# Patient Record
Sex: Male | Born: 1965
Health system: Southern US, Community
[De-identification: ages and names within clinical notes are randomized; demographics above are authoritative.]

## PROBLEM LIST (undated history)

## (undated) DIAGNOSIS — K219 Gastro-esophageal reflux disease without esophagitis: Secondary | ICD-10-CM

## (undated) DIAGNOSIS — E785 Hyperlipidemia, unspecified: Secondary | ICD-10-CM

## (undated) DIAGNOSIS — I10 Essential (primary) hypertension: Secondary | ICD-10-CM

## (undated) DIAGNOSIS — M1990 Unspecified osteoarthritis, unspecified site: Secondary | ICD-10-CM

## (undated) DIAGNOSIS — Z86010 Personal history of colonic polyps: Secondary | ICD-10-CM

## (undated) DIAGNOSIS — Z111 Encounter for screening for respiratory tuberculosis: Secondary | ICD-10-CM

## (undated) DIAGNOSIS — G4730 Sleep apnea, unspecified: Secondary | ICD-10-CM

## (undated) HISTORY — DX: Essential (primary) hypertension: I10

## (undated) HISTORY — DX: Unspecified osteoarthritis, unspecified site: M19.90

## (undated) HISTORY — DX: Hyperlipidemia, unspecified: E78.5

## (undated) HISTORY — PX: TYMPANOPLASTY: SHX33

## (undated) HISTORY — DX: Sleep apnea, unspecified: G47.30

---
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
## 2004-04-26 ENCOUNTER — Ambulatory Visit (HOSPITAL_COMMUNITY): Admission: RE | Admit: 2004-04-26 | Discharge: 2004-04-26 | Payer: Self-pay | Admitting: Internal Medicine

## 2004-07-17 HISTORY — PX: LUMBAR DISC SURGERY: SHX700

## 2005-01-20 ENCOUNTER — Emergency Department (HOSPITAL_COMMUNITY): Admission: EM | Admit: 2005-01-20 | Discharge: 2005-01-20 | Payer: Self-pay | Admitting: Emergency Medicine

## 2005-01-25 ENCOUNTER — Encounter: Admission: RE | Admit: 2005-01-25 | Discharge: 2005-02-14 | Payer: Self-pay | Admitting: Occupational Medicine

## 2005-01-27 ENCOUNTER — Emergency Department (HOSPITAL_COMMUNITY): Admission: EM | Admit: 2005-01-27 | Discharge: 2005-01-27 | Payer: Self-pay | Admitting: Emergency Medicine

## 2005-02-10 ENCOUNTER — Ambulatory Visit (HOSPITAL_COMMUNITY): Admission: RE | Admit: 2005-02-10 | Discharge: 2005-02-10 | Payer: Self-pay | Admitting: Orthopaedic Surgery

## 2005-03-08 ENCOUNTER — Observation Stay (HOSPITAL_COMMUNITY): Admission: RE | Admit: 2005-03-08 | Discharge: 2005-03-09 | Payer: Self-pay | Admitting: Orthopaedic Surgery

## 2005-04-25 ENCOUNTER — Encounter: Admission: RE | Admit: 2005-04-25 | Discharge: 2005-05-30 | Payer: Self-pay | Admitting: Orthopaedic Surgery

## 2008-07-17 HISTORY — PX: ROTATOR CUFF REPAIR: SHX139

## 2009-02-17 ENCOUNTER — Encounter: Admission: RE | Admit: 2009-02-17 | Discharge: 2009-04-08 | Payer: Self-pay | Admitting: Occupational Medicine

## 2009-03-30 ENCOUNTER — Ambulatory Visit (HOSPITAL_COMMUNITY): Admission: RE | Admit: 2009-03-30 | Discharge: 2009-03-30 | Payer: Self-pay | Admitting: Occupational Medicine

## 2009-05-06 ENCOUNTER — Ambulatory Visit (HOSPITAL_BASED_OUTPATIENT_CLINIC_OR_DEPARTMENT_OTHER): Admission: RE | Admit: 2009-05-06 | Discharge: 2009-05-07 | Payer: Self-pay | Admitting: Orthopedic Surgery

## 2010-10-20 LAB — POCT I-STAT, CHEM 8
BUN: 15 mg/dL (ref 6–23)
Calcium, Ion: 1.11 mmol/L — ABNORMAL LOW (ref 1.12–1.32)
Chloride: 108 mEq/L (ref 96–112)
Creatinine, Ser: 0.7 mg/dL (ref 0.4–1.5)
Glucose, Bld: 100 mg/dL — ABNORMAL HIGH (ref 70–99)
HCT: 45 % (ref 39.0–52.0)
Hemoglobin: 15.3 g/dL (ref 13.0–17.0)
Potassium: 4 mEq/L (ref 3.5–5.1)
Sodium: 138 mEq/L (ref 135–145)
TCO2: 21 mmol/L (ref 0–100)

## 2010-12-02 NOTE — Op Note (Signed)
Jim Hunter, Jim Hunter                 ACCOUNT NO.:  [id number]   MEDICAL RECORD NO.:  [id number]          PATIENT TYPE:  OBV   LOCATION:  3019                         FACILITY:  [facility]   PHYSICIAN:  Sharolyn Douglas, M.D.        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   PROCEDURE:  Left L4-5 extraforaminal microdiskectomy.   SURGEON:  Sharolyn Douglas, M.D.   ASSISTANT:  Verlin Fester, P.A.   ANESTHESIA:  General endotracheal.   COMPLICATIONS:  None.   INDICATIONS:  The patient is a pleasant 45 year old male with severe left  lower extremity radiculopathy secondary to large extruded extraforaminal  disk herniation. He has developed weakness in the quadriceps which has not  responded to conservative measures. Because of his severe pain and weakness,  he has elected to undergo microdiskectomy through an intertransverse  extraforaminal approach in order to hopefully improve his symptoms and his  weakness. He knows there are no guarantees.  Risks and benefits reviewed.   PROCEDURE:  The patient was identified holding area and taken to the  operating room, underwent general orotracheal anesthesia without difficulty  given prophylactic IV antibiotics. Turned prone onto the [place] frame. All  bony prominences padded. Face and eyes protected at all times. Back prepped,  draped usual sterile fashion. Fluoroscopy was brought into the field and the  L4-5 level was identified. A 3 cm incision was made 4 cm lateral to the  midline on the left side. Dissection was carried to the deep fascia.  Dilators from the Maxcess retractor system were used to spread the  paraspinal muscles docking on the lateral portion of the L4-5 facette joint  using fluoroscopy. The Maxcess retractor was then placed with the length 80-  mm blades over the final  dilator and gently spread. The retractor was  attached to the operating room table using the attachment arm. The  microscope was draped and brought into the field. The transverse processes  of L4-L5 were identified. The intertransverse membrane was elevated. We  immediately identified the L4 nerve root which was being displaced dorsally.  By gently mobilizing the nerve root cephalad, a large extruded disk  herniation was immediately identified. The herniation was removed with a  pituitary rongeur in one large chunk. We then explored the extraforaminal  space and identified the rent in the underlying annulus of the L4-5 disk.  This was entered using a pituitary and several additional loose fragments  were  removed. The wound was irrigated. Bleeding was controlled. The wound was  closed in layers with a 0 Vicryl on the deep layer, 2-0 Vicryl subcutaneous  layer and then Dermabond on the skin edges. The patient was turned supine,  extubated without difficulty and transferred to recovery in stable  condition.      Sharolyn Douglas, M.D.  Electronically Signed     MC/MEDQ  D:  03/08/2005  T:  03/09/2005  Job:  [id number] 

## 2011-03-03 ENCOUNTER — Other Ambulatory Visit: Payer: Self-pay | Admitting: Occupational Medicine

## 2011-03-03 DIAGNOSIS — M25561 Pain in right knee: Secondary | ICD-10-CM

## 2011-03-07 ENCOUNTER — Ambulatory Visit (HOSPITAL_COMMUNITY)
Admission: RE | Admit: 2011-03-07 | Discharge: 2011-03-07 | Disposition: A | Discharge status: Home / Self Care | Payer: PRIVATE HEALTH INSURANCE | Source: Ambulatory Visit | Attending: Occupational Medicine | Admitting: Occupational Medicine

## 2011-03-07 DIAGNOSIS — M23359 Other meniscus derangements, posterior horn of lateral meniscus, unspecified knee: Secondary | ICD-10-CM | POA: Insufficient documentation

## 2011-03-07 DIAGNOSIS — M25561 Pain in right knee: Secondary | ICD-10-CM

## 2011-03-07 DIAGNOSIS — M25469 Effusion, unspecified knee: Secondary | ICD-10-CM | POA: Insufficient documentation

## 2011-03-07 DIAGNOSIS — M1710 Unilateral primary osteoarthritis, unspecified knee: Secondary | ICD-10-CM | POA: Insufficient documentation

## 2011-03-07 DIAGNOSIS — M25569 Pain in unspecified knee: Secondary | ICD-10-CM | POA: Insufficient documentation

## 2011-03-07 DIAGNOSIS — IMO0002 Reserved for concepts with insufficient information to code with codable children: Secondary | ICD-10-CM | POA: Insufficient documentation

## 2011-03-07 DIAGNOSIS — R609 Edema, unspecified: Secondary | ICD-10-CM | POA: Insufficient documentation

## 2011-06-30 ENCOUNTER — Emergency Department
Admission: EM | Admit: 2011-06-30 | Discharge: 2011-06-30 | Disposition: A | Discharge status: Home / Self Care | Payer: PRIVATE HEALTH INSURANCE | Source: Home / Self Care | Attending: Emergency Medicine | Admitting: Emergency Medicine

## 2011-06-30 ENCOUNTER — Encounter: Payer: Self-pay | Admitting: Emergency Medicine

## 2011-06-30 DIAGNOSIS — M545 Low back pain, unspecified: Secondary | ICD-10-CM

## 2011-06-30 DIAGNOSIS — M62830 Muscle spasm of back: Secondary | ICD-10-CM

## 2011-06-30 DIAGNOSIS — M5380 Other specified dorsopathies, site unspecified: Secondary | ICD-10-CM

## 2011-06-30 MED ORDER — TRAMADOL-ACETAMINOPHEN 37.5-325 MG PO TABS: 1.0000 | ORAL_TABLET | Freq: Four times a day (QID) | ORAL | Status: AC | PRN

## 2011-06-30 MED ORDER — METAXALONE 800 MG PO TABS: 800.0000 mg | ORAL_TABLET | Freq: Three times a day (TID) | ORAL | Status: AC

## 2011-06-30 NOTE — ED Provider Notes (Signed)
History     CSN: [id number] Arrival date & time: 06/30/2011 11:07 AM   First MD Initiated Contact with Patient 06/30/11 1039      Chief Complaint  Patient presents with  . Back Pain    (Consider location/radiation/quality/duration/timing/severity/associated sxs/prior treatment) HPI The patient presents today with back pain. Location: Bilateral lower back Timing: For the last few days. He does a lot of lifting, pushing, and pulling at work. He thinks this is what aggravated. He has been having some chronic back pain for a while ever since he had a discectomy. Description: Tight spasm Worse with: Certain movements Better with: Rest  Trauma: no Bladder/bowel incontinence: no Weakness: no Fever/chills: no Night pain: no Unexplained weight loss: no Cancer/immunosuppression: no PMH of osteoporosis or chronic steroid use:  no   History reviewed. No pertinent past medical history.  No past surgical history on file.  No family history on file.  History  Substance Use Topics  . Smoking status: Not on file  . Smokeless tobacco: Not on file  . Alcohol Use: Not on file      Review of Systems  Allergies  Lipitor  Home Medications   Current Outpatient Rx  Name Route Sig Dispense Refill  . ETODOLAC 200 MG PO CAPS Oral Take 200 mg by mouth every 8 (eight) hours.      Marland Kitchen LISINOPRIL 10 MG PO TABS Oral Take 10 mg by mouth daily.      Marland Kitchen OMEPRAZOLE 10 MG PO CPDR Oral Take 10 mg by mouth daily.      Marland Kitchen METAXALONE 800 MG PO TABS Oral Take 1 tablet (800 mg total) by mouth 3 (three) times daily. 21 tablet 0  . TRAMADOL-ACETAMINOPHEN 37.5-325 MG PO TABS Oral Take 1 tablet by mouth every 6 (six) hours as needed for pain. 30 tablet 0    BP 126/86  Pulse 122  Temp(Src) 99 F (37.2 C) (Oral)  Resp 18  Ht 5\' 9"  (1.753 m)  Wt 243 lb (110.224 kg)  BMI 35.88 kg/m2  SpO2 98%  Physical Exam  Nursing note and vitals reviewed. Constitutional: He is oriented to person, place, and  time. He appears well-developed and well-nourished.  HENT:  Head: Normocephalic and atraumatic.  Eyes: No scleral icterus.  Neck: Neck supple.  Cardiovascular: Regular rhythm and normal heart sounds.   Pulmonary/Chest: Effort normal and breath sounds normal. No respiratory distress.  Musculoskeletal:       Lumbar back: He exhibits tenderness, pain and spasm. He exhibits no bony tenderness.  Neurological: He is alert and oriented to person, place, and time. He has normal strength and normal reflexes. No sensory deficit.  Skin: Skin is warm and dry. No rash noted.  Psychiatric: He has a normal mood and affect. His speech is normal.    ED Course  Procedures (including critical care time)  Labs Reviewed - No data to display No results found.   1. Pain in lower back   2. Back spasm       MDM  I have given him a prescription for both Flexeril and Ultracet. I informed him to use a heating pad, and avoid heavy lifting, pushing, and pulling for about a week. I've also given him the information for Dr. Shon Baton in case the pain continues since he states that he would like to consider getting a steroid injection. Other treatment options include physical therapy and other modalities.     Lily Kocher, MD 06/30/11 1124

## 2011-06-30 NOTE — ED Notes (Signed)
LBP after moving quickly 3 days ago

## 2012-07-29 ENCOUNTER — Encounter: Payer: Self-pay | Admitting: Family Medicine

## 2012-07-29 ENCOUNTER — Ambulatory Visit (INDEPENDENT_AMBULATORY_CARE_PROVIDER_SITE_OTHER): Payer: 59 | Admitting: Family Medicine

## 2012-07-29 VITALS — BP 110/78 | HR 87 | Temp 97.8°F | Ht 69.0 in | Wt 236.0 lb

## 2012-07-29 DIAGNOSIS — E669 Obesity, unspecified: Secondary | ICD-10-CM | POA: Insufficient documentation

## 2012-07-29 DIAGNOSIS — I10 Essential (primary) hypertension: Secondary | ICD-10-CM

## 2012-07-29 DIAGNOSIS — E785 Hyperlipidemia, unspecified: Secondary | ICD-10-CM

## 2012-07-29 MED ORDER — SILDENAFIL CITRATE 100 MG PO TABS: 100.0000 mg | ORAL_TABLET | Freq: Every day | ORAL | Status: DC | PRN

## 2012-07-29 MED ORDER — OMEPRAZOLE 20 MG PO CPDR: 20.0000 mg | DELAYED_RELEASE_CAPSULE | Freq: Every day | ORAL | Status: DC

## 2012-07-29 MED ORDER — LISINOPRIL-HYDROCHLOROTHIAZIDE 20-12.5 MG PO TABS: 1.0000 | ORAL_TABLET | Freq: Two times a day (BID) | ORAL | Status: DC

## 2012-07-29 NOTE — Progress Notes (Signed)
  Subjective:    Patient ID: Jim Hunter, male    DOB: 1966-04-20, 47 y.o.   MRN: [id number]  HPI  Jim Hunter comes in to establish care.  He has no specific complaints today.   HTN- takes lisinopril/hctz twice daily, he has no side effects from the medication.  He denies chest pain, dyspnea, LE edema, palpitations.   Obesity- has lost about 7 lbs in the last year, says he watched his diet very carefully during Jim Hunter last year and lost weight, is hoping to do the same thing again this year.  He says he has been avoiding pork and salt, which has helped.  He does not get much formal exercise but does a lot of walking.   HLD- allergic to statins, has been trying to control it with diet, says he thinks last panel was checked about 3 months ago but not sure what it was.   Past Medical History  Diagnosis Date  . Hypertension   . Hyperlipidemia   . Sleep apnea   . Arthritis    Family History  Problem Relation Age of Onset  . Hypertension Mother   . Hypertension Sister   . Hypertension Brother   . Hypertension Maternal Grandmother    History  Substance Use Topics  . Smoking status: Former Smoker -- 10.0 packs/day  . Smokeless tobacco: Never Used  . Alcohol Use: Yes     Comment: Ocasional    Review of Systems Pt complains of decreased hearing, otherwise a 12-point review of systems was negative.     Objective:   Physical Exam BP 110/78  Pulse 87  Temp 97.8 F (36.6 C) (Oral)  Ht 5\' 9"  (1.753 m)  Wt 236 lb (107.049 kg)  BMI 34.85 kg/m2 General appearance: alert, cooperative and no distress Neck: no adenopathy, supple, symmetrical, trachea midline and thyroid not enlarged, symmetric, no tenderness/mass/nodules Lungs: clear to auscultation bilaterally Heart: regular rate and rhythm, S1, S2 normal, no murmur, click, rub or gallop Extremities: extremities normal, atraumatic, no cyanosis or edema Pulses: 2+ and symmetric      Assessment & Plan:

## 2012-07-29 NOTE — Assessment & Plan Note (Signed)
Well controlled on prinzide BID- will not make changes, refill sent to pharmacy.

## 2012-07-29 NOTE — Assessment & Plan Note (Signed)
Statin allergy, says cholesterol checked recently, will try to obtain records from previous doctor, if unable will plan on cholesterol panel next visit.

## 2012-07-29 NOTE — Patient Instructions (Signed)
It was nice to meet you.  Your blood pressure today was BP: 110/78 mmHg.  Remember your goal blood pressure is about 130/80 or lower.  Please be sure to take your medication every day.  Great Job!  Please continue to watch your diet, and increase your activity.  Please come back and see me in about 6 months for a check up, or sooner if needed.

## 2012-07-29 NOTE — Assessment & Plan Note (Signed)
Discussed diet and exercise, pt is making some dietary changes but not highly motivated to lose weight right now.

## 2012-08-08 ENCOUNTER — Telehealth: Payer: Self-pay | Admitting: Family Medicine

## 2012-08-08 NOTE — Telephone Encounter (Signed)
Called patient left message.  I received his records from his prior doctor, had lipid panel last October, with total cholestero 217, LDL 152.  He is allergic to all statins, has also tried tricor and lovaza, does not tolerate them.  I asked him to call back and let me know if he has tried Zetia before because it is different from the other medications he has tried, it could be a good option for him.

## 2012-08-09 NOTE — Telephone Encounter (Signed)
Pt is asking about side affects for this medicine - Zetia - wants to speak with Dr Lula Olszewski.  He will be at work at [phone] (his supervisor can get him) after 12pm

## 2012-08-09 NOTE — Telephone Encounter (Signed)
Tried to call patient back, no answer, will try again later.

## 2012-08-13 NOTE — Telephone Encounter (Signed)
Tried to call again, tried work, pt was not there tried again, no answer.  Will try again later.

## 2012-08-13 NOTE — Telephone Encounter (Signed)
Tried to call home as work said he was not there today earlier.  Left voice mail, will try again later.

## 2012-08-15 ENCOUNTER — Telehealth: Payer: Self-pay | Admitting: Family Medicine

## 2012-08-15 NOTE — Telephone Encounter (Signed)
Pt is asking for you to send infor on MyChart so he can log in.  Is also asking for refill on his Lodine & Ultram - these haven't been filled before by [facility]

## 2012-08-22 ENCOUNTER — Encounter: Payer: Self-pay | Admitting: Family Medicine

## 2012-08-22 MED ORDER — ETODOLAC 200 MG PO CAPS: 200.0000 mg | ORAL_CAPSULE | Freq: Three times a day (TID) | ORAL | Status: DC | PRN

## 2012-08-22 MED ORDER — TRAMADOL HCL 50 MG PO TABS: 50.0000 mg | ORAL_TABLET | Freq: Four times a day (QID) | ORAL | Status: DC | PRN

## 2012-08-22 NOTE — Telephone Encounter (Signed)
I have sent him a letter in the mail with instructions.  I have refilled the medications.  Please let him know.

## 2012-08-23 NOTE — Telephone Encounter (Signed)
LMOVM informing pt that she "mailed the instructions for mychart and refilled the medications you requested". Hester Joslin, Maryjo Rochester

## 2012-08-31 ENCOUNTER — Other Ambulatory Visit: Payer: Self-pay

## 2012-09-20 ENCOUNTER — Other Ambulatory Visit: Payer: Self-pay | Admitting: Family Medicine

## 2012-09-20 MED ORDER — SILDENAFIL CITRATE 100 MG PO TABS: 100.0000 mg | ORAL_TABLET | Freq: Every day | ORAL | Status: DC | PRN

## 2012-10-02 ENCOUNTER — Encounter: Payer: Self-pay | Admitting: Family Medicine

## 2012-10-02 ENCOUNTER — Ambulatory Visit (INDEPENDENT_AMBULATORY_CARE_PROVIDER_SITE_OTHER): Payer: 59 | Admitting: Family Medicine

## 2012-10-02 VITALS — BP 107/76 | HR 76 | Temp 98.1°F | Ht 69.0 in | Wt 238.8 lb

## 2012-10-02 DIAGNOSIS — E785 Hyperlipidemia, unspecified: Secondary | ICD-10-CM

## 2012-10-02 DIAGNOSIS — Z23 Encounter for immunization: Secondary | ICD-10-CM

## 2012-10-02 DIAGNOSIS — K429 Umbilical hernia without obstruction or gangrene: Secondary | ICD-10-CM

## 2012-10-02 DIAGNOSIS — K219 Gastro-esophageal reflux disease without esophagitis: Secondary | ICD-10-CM

## 2012-10-02 MED ORDER — EZETIMIBE 10 MG PO TABS: 10.0000 mg | ORAL_TABLET | Freq: Every day | ORAL | Status: DC

## 2012-10-02 MED ORDER — OMEPRAZOLE 20 MG PO CPDR: 40.0000 mg | DELAYED_RELEASE_CAPSULE | Freq: Every day | ORAL | Status: DC

## 2012-10-02 NOTE — Assessment & Plan Note (Signed)
Will start Zetia as he has allergy to statin.  Discussed lifestyle modification.

## 2012-10-02 NOTE — Progress Notes (Signed)
  Subjective:    Patient ID: Jim Hunter, male    DOB: 1966/07/17, 47 y.o.   MRN: [id number]  HPI  Jim Hunter comes in for follow up.  He noticed in My Chart that he is due for his Tdap.    He complains that omeprazole was helping with his GERD, but now it is not working as well.  He does admit that he has changed his diet some with Alwyn Pea- but says he gave up meat and alcohol.  He does admit to eating spicy food and some fried foods though.   HLD: allergic to statins, last lipids done (scanned in) in October with LDL 153 and total cholesterol 217.  I discussed that I strongly recommend trying different medication for lipids.  He is trying to eat a healthy diet, and is very active at work but does not get much formal exercise.   Abdomen: He also complains of some discomfort around his umbilicus, this comes and goes and never lasts very long.    Past Medical History  Diagnosis Date  . Hypertension   . Hyperlipidemia   . Sleep apnea   . Arthritis    Family History  Problem Relation Age of Onset  . Hypertension Mother   . Hypertension Sister   . Hypertension Brother   . Hypertension Maternal Grandmother    History  Substance Use Topics  . Smoking status: Former Smoker -- 10.00 packs/day  . Smokeless tobacco: Never Used  . Alcohol Use: Yes     Comment: Ocasional    Review of Systems See HPI    Objective:   Physical Exam BP 107/76  Pulse 76  Temp(Src) 98.1 F (36.7 C) (Oral)  Ht 5\' 9"  (1.753 m)  Wt 238 lb 12.8 oz (108.319 kg)  BMI 35.25 kg/m2 General appearance: alert, cooperative and no distress Lungs: clear to auscultation bilaterally Heart: regular rate and rhythm, S1, S2 normal, no murmur, click, rub or gallop Abd: There is a small 1cm umbilical hernia that is soft and reducible.  Extremities: extremities normal, atraumatic, no cyanosis or edema      Assessment & Plan:

## 2012-10-02 NOTE — Assessment & Plan Note (Signed)
Increase omeprazole to 40 daily, gave hand out for GERD diet changes.

## 2012-10-02 NOTE — Patient Instructions (Signed)
It was good to see you.  Please try a new cholesterol pill- Zetia.  Send me a message if you have any problems with it.   Also, you can try increasing your omeprazole to two pills in the morning.  Also, see the hand out with GERD diet suggestions.

## 2012-10-02 NOTE — Assessment & Plan Note (Signed)
Discussed that hernias tend to get bigger over time and that at some point he may need surgery to repair this.  Discussed risk of incarcerated hernia, and red flags to go to ER for.  He will think about this and let me know if he wants to see surgery.

## 2012-12-12 ENCOUNTER — Ambulatory Visit: Payer: Self-pay

## 2012-12-12 ENCOUNTER — Other Ambulatory Visit: Payer: Self-pay | Admitting: Occupational Medicine

## 2012-12-12 DIAGNOSIS — R52 Pain, unspecified: Secondary | ICD-10-CM

## 2013-02-04 ENCOUNTER — Encounter: Payer: Self-pay | Admitting: Family Medicine

## 2013-02-06 ENCOUNTER — Other Ambulatory Visit: Payer: Self-pay | Admitting: Family Medicine

## 2013-02-06 DIAGNOSIS — E785 Hyperlipidemia, unspecified: Secondary | ICD-10-CM

## 2013-02-06 MED ORDER — EZETIMIBE 10 MG PO TABS: 10.0000 mg | ORAL_TABLET | Freq: Every day | ORAL | Status: DC

## 2013-02-28 ENCOUNTER — Other Ambulatory Visit: Payer: Self-pay | Admitting: Family Medicine

## 2013-03-14 ENCOUNTER — Other Ambulatory Visit: Payer: Self-pay | Admitting: Family Medicine

## 2013-03-20 ENCOUNTER — Other Ambulatory Visit: Payer: Self-pay | Admitting: Family Medicine

## 2013-03-26 ENCOUNTER — Telehealth: Payer: Self-pay | Admitting: Family Medicine

## 2013-03-26 MED ORDER — ETODOLAC 200 MG PO CAPS: 200.0000 mg | ORAL_CAPSULE | Freq: Three times a day (TID) | ORAL | Status: DC | PRN

## 2013-03-26 MED ORDER — TRAMADOL HCL 50 MG PO TABS: ORAL_TABLET | ORAL | Status: DC

## 2013-03-26 NOTE — Telephone Encounter (Signed)
Pt came by to check on progress of refill authorization for Tramadol and Lodine. Please advise

## 2013-03-26 NOTE — Telephone Encounter (Signed)
Will need appt prior to refilling Tramadol again Tramadol refill at front desk for pick up

## 2013-03-26 NOTE — Telephone Encounter (Signed)
Left message for pt that rx is ready for pick up.  Please advise if he calls back that he needs appt. Jazmin Hartsell,CMA

## 2013-04-14 ENCOUNTER — Encounter: Payer: Self-pay | Admitting: Family Medicine

## 2013-04-17 ENCOUNTER — Encounter: Payer: Self-pay | Admitting: Family Medicine

## 2013-05-01 ENCOUNTER — Ambulatory Visit (INDEPENDENT_AMBULATORY_CARE_PROVIDER_SITE_OTHER): Payer: 59 | Admitting: Family Medicine

## 2013-05-01 ENCOUNTER — Encounter: Payer: Self-pay | Admitting: Family Medicine

## 2013-05-01 VITALS — BP 114/83 | HR 85 | Temp 99.1°F | Wt 227.0 lb

## 2013-05-01 DIAGNOSIS — H938X9 Other specified disorders of ear, unspecified ear: Secondary | ICD-10-CM

## 2013-05-01 DIAGNOSIS — E785 Hyperlipidemia, unspecified: Secondary | ICD-10-CM

## 2013-05-01 DIAGNOSIS — I10 Essential (primary) hypertension: Secondary | ICD-10-CM

## 2013-05-01 DIAGNOSIS — H938X2 Other specified disorders of left ear: Secondary | ICD-10-CM

## 2013-05-01 DIAGNOSIS — M549 Dorsalgia, unspecified: Secondary | ICD-10-CM

## 2013-05-01 DIAGNOSIS — K219 Gastro-esophageal reflux disease without esophagitis: Secondary | ICD-10-CM

## 2013-05-01 DIAGNOSIS — E669 Obesity, unspecified: Secondary | ICD-10-CM

## 2013-05-01 MED ORDER — LISINOPRIL-HYDROCHLOROTHIAZIDE 20-12.5 MG PO TABS: 1.0000 | ORAL_TABLET | Freq: Every day | ORAL | Status: DC

## 2013-05-01 MED ORDER — OMEPRAZOLE 20 MG PO CPDR: 40.0000 mg | DELAYED_RELEASE_CAPSULE | Freq: Every day | ORAL | Status: DC

## 2013-05-01 MED ORDER — ETODOLAC 200 MG PO CAPS: 200.0000 mg | ORAL_CAPSULE | Freq: Three times a day (TID) | ORAL | Status: DC | PRN

## 2013-05-01 MED ORDER — SILDENAFIL CITRATE 100 MG PO TABS: 100.0000 mg | ORAL_TABLET | Freq: Every day | ORAL | Status: DC | PRN

## 2013-05-01 MED ORDER — TRAMADOL HCL 50 MG PO TABS: ORAL_TABLET | ORAL | Status: DC

## 2013-05-01 NOTE — Patient Instructions (Signed)
THank you for coming in today Please decrease your lisinopril/HCTZ to once a day and check your pressures daily  Please decrease your prilosec to only as needed unless you flare adn take it for 2 weeks Get your fasting blood work done tomorrow.

## 2013-05-01 NOTE — Progress Notes (Signed)
Jim Hunter is a 47 y.o. male who presents to [facility] today for f/u   HLD: Tried tricor and another cholesterol medicine but caused rash. Denies CP SOB, palpitations  HTN: taking lisinopril and HCTZ. Excellent control   Lumbar pain: flares every couple months. Achy. S/p diskectomy. Denies numbness in feet or toes.    GERD: takes once daily. Has tried coming off before and flared again after some time.   The following portions of the patient's history were reviewed and updated as appropriate: allergies, current medications, past medical history, family and social history, and problem list.  Patient is a nonsmoker.   Past Medical History  Diagnosis Date  . Hypertension   . Hyperlipidemia   . Sleep apnea   . Arthritis    ROS as above otherwise neg.    Medications reviewed. Current Outpatient Prescriptions  Medication Sig Dispense Refill  . etodolac (LODINE) 200 MG capsule Take 1 capsule (200 mg total) by mouth every 8 (eight) hours as needed (pain.  Take with food. ).  30 capsule  0  . ezetimibe (ZETIA) 10 MG tablet Take 1 tablet (10 mg total) by mouth daily.  90 tablet  3  . lisinopril-hydrochlorothiazide (PRINZIDE,ZESTORETIC) 20-12.5 MG per tablet TAKE 1 TABLET BY MOUTH 2 TIMES DAILY.  180 tablet  1  . omeprazole (PRILOSEC) 20 MG capsule Take 2 capsules (40 mg total) by mouth daily.  180 capsule  1  . sildenafil (VIAGRA) 100 MG tablet Take 1 tablet (100 mg total) by mouth daily as needed for erectile dysfunction.  10 tablet  3  . traMADol (ULTRAM) 50 MG tablet TAKE 1 TABLET BY MOUTH EVERY 6 HOURS AS NEEDED  30 tablet  0   No current facility-administered medications for this visit.    Exam:  BP 114/83  Pulse 85  Temp(Src) 99.1 F (37.3 C) (Oral)  Wt 227 lb (102.967 kg)  BMI 33.51 kg/m2 Gen: Well NAD HEENT: EOMI,  MMM, L TM partially obstructed by cerumen. TM w/ possible sinus tract. No discharge or purulence or erythema Lungs: CTABL Nl WOB Heart: RRR no MRG Abd: NABS, NT,  ND Exts: Non edematous BL  LE, warm and well perfused.   No results found for this or any previous visit (from the past 72 hour(s)).

## 2013-05-02 ENCOUNTER — Other Ambulatory Visit (INDEPENDENT_AMBULATORY_CARE_PROVIDER_SITE_OTHER): Payer: 59

## 2013-05-02 DIAGNOSIS — E785 Hyperlipidemia, unspecified: Secondary | ICD-10-CM

## 2013-05-02 DIAGNOSIS — M549 Dorsalgia, unspecified: Secondary | ICD-10-CM | POA: Insufficient documentation

## 2013-05-02 DIAGNOSIS — E669 Obesity, unspecified: Secondary | ICD-10-CM

## 2013-05-02 DIAGNOSIS — H938X2 Other specified disorders of left ear: Secondary | ICD-10-CM | POA: Insufficient documentation

## 2013-05-02 LAB — CBC
HCT: 37.7 % — ABNORMAL LOW (ref 39.0–52.0)
Hemoglobin: 12.9 g/dL — ABNORMAL LOW (ref 13.0–17.0)
MCH: 27.2 pg (ref 26.0–34.0)
MCV: 79.5 fL (ref 78.0–100.0)
RBC: 4.74 MIL/uL (ref 4.22–5.81)
RDW: 16 % — ABNORMAL HIGH (ref 11.5–15.5)

## 2013-05-02 LAB — COMPREHENSIVE METABOLIC PANEL
ALT: 25 U/L (ref 0–53)
Albumin: 4.2 g/dL (ref 3.5–5.2)
Alkaline Phosphatase: 52 U/L (ref 39–117)
CO2: 26 mEq/L (ref 19–32)
Calcium: 9.2 mg/dL (ref 8.4–10.5)
Chloride: 106 mEq/L (ref 96–112)
Creat: 1.19 mg/dL (ref 0.50–1.35)
Total Protein: 6.9 g/dL (ref 6.0–8.3)

## 2013-05-02 LAB — LIPID PANEL
Cholesterol: 193 mg/dL (ref 0–200)
Total CHOL/HDL Ratio: 5.2 Ratio

## 2013-05-02 NOTE — Assessment & Plan Note (Addendum)
Intermittent flares requiring tramadol and lodine.  Last dosing February for both meds.

## 2013-05-02 NOTE — Assessment & Plan Note (Addendum)
Lipid panel adn CMET as pt desiring to change cholesterol therapy if possible Will calculate ASCVD risk to determine need for lipid lowering meds

## 2013-05-02 NOTE — Progress Notes (Signed)
CMP,CBC,FLP AND A1C DONE TODAY Siriah Treat 

## 2013-05-02 NOTE — Assessment & Plan Note (Signed)
Pt to come off daily PPI and only use PRN w/ occasional 2wk burst

## 2013-05-02 NOTE — Assessment & Plan Note (Signed)
Decrease lisiopril/HCTZ to once daily dosing Pt to check pressures Return in 4 wks and will decide if OK to maintain lower dose.  SBP excellent but concern for DBP being elevated

## 2013-05-02 NOTE — Assessment & Plan Note (Signed)
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 Fullness feeling and apparent sinus tract on exam Will refer to ENT for consult on closure

## 2013-05-08 ENCOUNTER — Telehealth: Payer: Self-pay | Admitting: *Deleted

## 2013-05-08 NOTE — Telephone Encounter (Signed)
LMOVM asking patient to call back.  Want to know where pt prefers to go (for ENT) or where he has been in the past. Osborne Oman, CMA

## 2013-05-21 ENCOUNTER — Encounter: Payer: Self-pay | Admitting: Family Medicine

## 2013-05-22 ENCOUNTER — Other Ambulatory Visit: Payer: Self-pay

## 2013-06-02 ENCOUNTER — Ambulatory Visit (INDEPENDENT_AMBULATORY_CARE_PROVIDER_SITE_OTHER): Payer: 59 | Admitting: Family Medicine

## 2013-06-02 ENCOUNTER — Encounter: Payer: Self-pay | Admitting: Family Medicine

## 2013-06-02 VITALS — BP 123/84 | HR 77 | Temp 98.4°F | Wt 238.0 lb

## 2013-06-02 DIAGNOSIS — H938X9 Other specified disorders of ear, unspecified ear: Secondary | ICD-10-CM

## 2013-06-02 DIAGNOSIS — E785 Hyperlipidemia, unspecified: Secondary | ICD-10-CM

## 2013-06-02 DIAGNOSIS — H938X2 Other specified disorders of left ear: Secondary | ICD-10-CM

## 2013-06-02 DIAGNOSIS — K219 Gastro-esophageal reflux disease without esophagitis: Secondary | ICD-10-CM

## 2013-06-02 DIAGNOSIS — M549 Dorsalgia, unspecified: Secondary | ICD-10-CM

## 2013-06-02 DIAGNOSIS — I10 Essential (primary) hypertension: Secondary | ICD-10-CM

## 2013-06-02 MED ORDER — TRAMADOL HCL 50 MG PO TABS: ORAL_TABLET | ORAL | Status: DC

## 2013-06-02 NOTE — Patient Instructions (Signed)
You are doing great Please stop the Zetia and lisinopril/HCTZ Please check your blood pressure regularly and keep track of your numbers Please come back in 1 month to discuss wether or not to restart your blood pressure meds Please continue to improve your diet Please call with any further questions

## 2013-06-02 NOTE — Assessment & Plan Note (Signed)
BP excellent on reduced meds Lengthy discussion over pros and cons of meds  Pt to DC lisinopril/HCTZ for now and check BP regularly at work and then return in 1 mo  Pt would prefer to stay off all meds

## 2013-06-02 NOTE — Assessment & Plan Note (Signed)
Pt to see ENT today

## 2013-06-02 NOTE — Assessment & Plan Note (Signed)
Excellent control w/ only PRN Prilosec

## 2013-06-02 NOTE — Assessment & Plan Note (Signed)
ASCVD risk 4.3% today Pt would like to stop as many meds as possible Will stop Zetia at this time Pt reports previous lipids w/ LDL of >200 Continue diet and exercise Recheck full cholesterol panel in 12 mo

## 2013-06-02 NOTE — Assessment & Plan Note (Signed)
Did not get Tramadol script from last appt Refill today Should last pt 6+ months

## 2013-06-02 NOTE — Progress Notes (Signed)
Jim Hunter is a 47 y.o. male who presents to [facility] today for F/U  GERD: only taking Prilosec as needed. Approximately 1-2x wkly. Has identified triggers.  HTN: Only taking lisinopril once daily. No CP, palpitations, syncope, HA  Lipids: stopped zetia 1 wk ago.   The following portions of the patient's history were reviewed and updated as appropriate: allergies, current medications, past medical history, family and social history, and problem list.  Patient is a nonsmoker.  Past Medical History  Diagnosis Date  . Hypertension   . Hyperlipidemia   . Sleep apnea   . Arthritis     ROS as above otherwise neg.    Medications reviewed. Current Outpatient Prescriptions  Medication Sig Dispense Refill  . etodolac (LODINE) 200 MG capsule Take 1 capsule (200 mg total) by mouth every 8 (eight) hours as needed (pain.  Take with food. ).  30 capsule  0  . ezetimibe (ZETIA) 10 MG tablet Take 1 tablet (10 mg total) by mouth daily.  90 tablet  3  . lisinopril-hydrochlorothiazide (PRINZIDE,ZESTORETIC) 20-12.5 MG per tablet Take 1 tablet by mouth daily.  180 tablet  1  . omeprazole (PRILOSEC) 20 MG capsule Take 2 capsules (40 mg total) by mouth daily.  90 capsule  1  . sildenafil (VIAGRA) 100 MG tablet Take 1 tablet (100 mg total) by mouth daily as needed for erectile dysfunction.  10 tablet  3  . traMADol (ULTRAM) 50 MG tablet TAKE 1 TABLET BY MOUTH EVERY 6 HOURS AS NEEDED  30 tablet  0   No current facility-administered medications for this visit.    Exam: BP 123/84  Pulse 77  Temp(Src) 98.4 F (36.9 C) (Oral)  Wt 238 lb (107.956 kg) Gen: Well NAD HEENT: EOMI,  MMM Lungs: CTABL Nl WOB Heart: RRR no MRG Abd: NABS, NT, ND   No results found for this or any previous visit (from the past 72 hour(s)).

## 2013-06-17 ENCOUNTER — Ambulatory Visit (INDEPENDENT_AMBULATORY_CARE_PROVIDER_SITE_OTHER): Payer: 59 | Admitting: *Deleted

## 2013-06-17 ENCOUNTER — Telehealth: Payer: Self-pay | Admitting: Family Medicine

## 2013-06-17 ENCOUNTER — Other Ambulatory Visit: Payer: Self-pay | Admitting: Family Medicine

## 2013-06-17 VITALS — BP 122/84

## 2013-06-17 DIAGNOSIS — I10 Essential (primary) hypertension: Secondary | ICD-10-CM

## 2013-06-17 MED ORDER — ETODOLAC 200 MG PO CAPS: 200.0000 mg | ORAL_CAPSULE | Freq: Three times a day (TID) | ORAL | Status: DC | PRN

## 2013-06-17 NOTE — Telephone Encounter (Signed)
Pt came in stating that he needs his prescription for [facility] sent to the pharmacy.

## 2013-06-17 NOTE — Progress Notes (Signed)
Patient in today for blood pressure check, Patient states that he has'nt been taking any blood perssure medications. Blood pressure taken manually with large cuff in left arm was 122/84. Will forward to PCP.

## 2013-06-19 ENCOUNTER — Telehealth: Payer: Self-pay | Admitting: Family Medicine

## 2013-07-02 ENCOUNTER — Encounter: Payer: Self-pay | Admitting: Family Medicine

## 2013-07-02 ENCOUNTER — Ambulatory Visit (INDEPENDENT_AMBULATORY_CARE_PROVIDER_SITE_OTHER): Payer: 59 | Admitting: Family Medicine

## 2013-07-02 VITALS — BP 142/89 | HR 83 | Ht 69.0 in | Wt 236.0 lb

## 2013-07-02 DIAGNOSIS — I10 Essential (primary) hypertension: Secondary | ICD-10-CM

## 2013-07-02 DIAGNOSIS — R200 Anesthesia of skin: Secondary | ICD-10-CM

## 2013-07-02 DIAGNOSIS — E785 Hyperlipidemia, unspecified: Secondary | ICD-10-CM

## 2013-07-02 DIAGNOSIS — H938X2 Other specified disorders of left ear: Secondary | ICD-10-CM

## 2013-07-02 DIAGNOSIS — R209 Unspecified disturbances of skin sensation: Secondary | ICD-10-CM

## 2013-07-02 DIAGNOSIS — H938X9 Other specified disorders of ear, unspecified ear: Secondary | ICD-10-CM

## 2013-07-02 MED ORDER — ETODOLAC 400 MG PO TABS: ORAL_TABLET | ORAL | Status: DC

## 2013-07-02 NOTE — Assessment & Plan Note (Signed)
Radicular pain from likely [facility] injury.  Lodine 400mg  BID x 7 days Avoid exacerbating factors.  No carpel tunnel

## 2013-07-02 NOTE — Assessment & Plan Note (Signed)
Started on flonase by ENT Recommended sudafed (aware of BP changes)

## 2013-07-02 NOTE — Assessment & Plan Note (Signed)
Borderline today but previous read at goal Lengthy discussion had w/ pt concernning BP and risks of elevation Pt would like to avoid medications as much as possible. Monitor for now. Pt to check at work or come in for BP checks Pt to come back in 4-12 wks pending on home reads. If elevated will need to go back on low dose.

## 2013-07-02 NOTE — Patient Instructions (Signed)
You are doing well overall You blood pressure is borderline normal Please check this from time to time or come in to our office for a check Please come back to see me in 4 weeks if it remains elevated or 12 weeks if it remains normal Your arm pain is radicular/nerve pain. You have likely inflamed one of your arm nerves.  Please take the Etodolac 400mg  twice a day for the next 7 days and try to avoid any activities that will make you pain/numbness worse Have a wonderful day.

## 2013-07-02 NOTE — Assessment & Plan Note (Signed)
Lipid panel in 11 mo Con diet and exercise

## 2013-07-02 NOTE — Progress Notes (Signed)
Jim Hunter is a 47 y.o. male who presents to [facility] today for arm pain and HTN  HTN: Worked late night. Coffee this am. Denies CP, SOB, Syncope, HA. Does not check at work. Feels well. Off BP meds since last appt  HLD: ROS as above.  Tingling L arm: starts in L wrist or in antecubiatl fossa. Worse when carrying things or at random times. Nearly daily symptoms. Shakes it out to improve. Ongoing for about 2 wks. No change in activity or sleep habits. Denies any loss of function.    The following portions of the patient's history were reviewed and updated as appropriate: allergies, current medications, past medical history, family and social history, and problem list.  Patient is a nonsmoker.  Health Maintenance: has flu shot  Past Medical History  Diagnosis Date  . Hypertension   . Hyperlipidemia   . Sleep apnea   . Arthritis     ROS as above otherwise neg.    Medications reviewed. Current Outpatient Prescriptions  Medication Sig Dispense Refill  . etodolac (LODINE) 200 MG capsule Take 1 capsule (200 mg total) by mouth every 8 (eight) hours as needed (pain.  Take with food. ).  30 capsule  3  . etodolac (LODINE) 400 MG tablet TAKE 1/2 CAPSULE BY MOUTH EVERY 8 HOURS AS NEEDED (PAIN. TAKE WITH FOOD. )  15 tablet  0  . ezetimibe (ZETIA) 10 MG tablet Take 1 tablet (10 mg total) by mouth daily.  90 tablet  3  . lisinopril-hydrochlorothiazide (PRINZIDE,ZESTORETIC) 20-12.5 MG per tablet Take 1 tablet by mouth daily.  180 tablet  1  . omeprazole (PRILOSEC) 20 MG capsule Take 2 capsules (40 mg total) by mouth daily.  90 capsule  1  . sildenafil (VIAGRA) 100 MG tablet Take 1 tablet (100 mg total) by mouth daily as needed for erectile dysfunction.  10 tablet  3  . traMADol (ULTRAM) 50 MG tablet TAKE 1 TABLET BY MOUTH EVERY 6 HOURS AS NEEDED  30 tablet  0   No current facility-administered medications for this visit.    Exam:  BP 142/89  Pulse 83  Ht 5\' 9"  (1.753 m)  Wt 236 lb (107.049 kg)   BMI 34.84 kg/m2 Gen: Well NAD HEENT: EOMI,  MMM  MSK: FROM or arms and wrist. Non-ttp. No muscle waisting. phalans test neg. Sensation intact. 5/5 grip strength.  No results found for this or any previous visit (from the past 72 hour(s)).  A/P (as seen in Problem list)  Hypertension Borderline today but previous read at goal Lengthy discussion had w/ pt concernning BP and risks of elevation Pt would like to avoid medications as much as possible. Monitor for now. Pt to check at work or come in for BP checks Pt to come back in 4-12 wks pending on home reads. If elevated will need to go back on low dose.   Hyperlipidemia Lipid panel in 11 mo Con diet and exercise  Sensation of fullness in left ear Started on flonase by ENT Recommended sudafed (aware of BP changes)  Numbness and tingling in left arm Radicular pain from likely MSK injury.  Lodine 400mg  BID x 7 days Avoid exacerbating factors.  No carpel tunnel

## 2013-08-27 ENCOUNTER — Other Ambulatory Visit: Payer: Self-pay | Admitting: Family Medicine

## 2013-09-08 NOTE — Telephone Encounter (Signed)
Please call pt and inform that we received refill request note for tramadol per last note this should have lasted 88mo. Only 4mo now since last refill. If pt needing refill inform that we can give limited number (15) but would need appt prior to further refills due to clinic policy

## 2013-09-09 ENCOUNTER — Encounter: Payer: Self-pay | Admitting: Family Medicine

## 2013-09-12 ENCOUNTER — Telehealth: Payer: Self-pay | Admitting: Family Medicine

## 2013-09-12 NOTE — Telephone Encounter (Signed)
Called pt to ask further questions about tramadol use and need Left a voicemail. Sent pt Epic email requesting more information  Linna Darner, MD Family Medicine PGY-3 09/12/2013, 10:39 AM

## 2013-09-15 ENCOUNTER — Other Ambulatory Visit: Payer: Self-pay | Admitting: Family Medicine

## 2013-09-15 DIAGNOSIS — M549 Dorsalgia, unspecified: Secondary | ICD-10-CM

## 2013-09-15 MED ORDER — TRAMADOL HCL 50 MG PO TABS: ORAL_TABLET | ORAL | Status: DC

## 2013-09-15 NOTE — Progress Notes (Signed)
Tramadol pohoned in. Pt to [facility] f/u appt prior to next refill

## 2013-09-30 IMAGING — CR DG SHOULDER 2+V*R*
3 series · 3 of 3 positions shown · non-contrast
Comparison: None.

CLINICAL DATA: Lifting injury (11/13/2012) with persistent pain in
the right shoulder radiating down the right arm

RIGHT SHOULDER - 2+ VIEW

[view not recorded (1 of 3)]
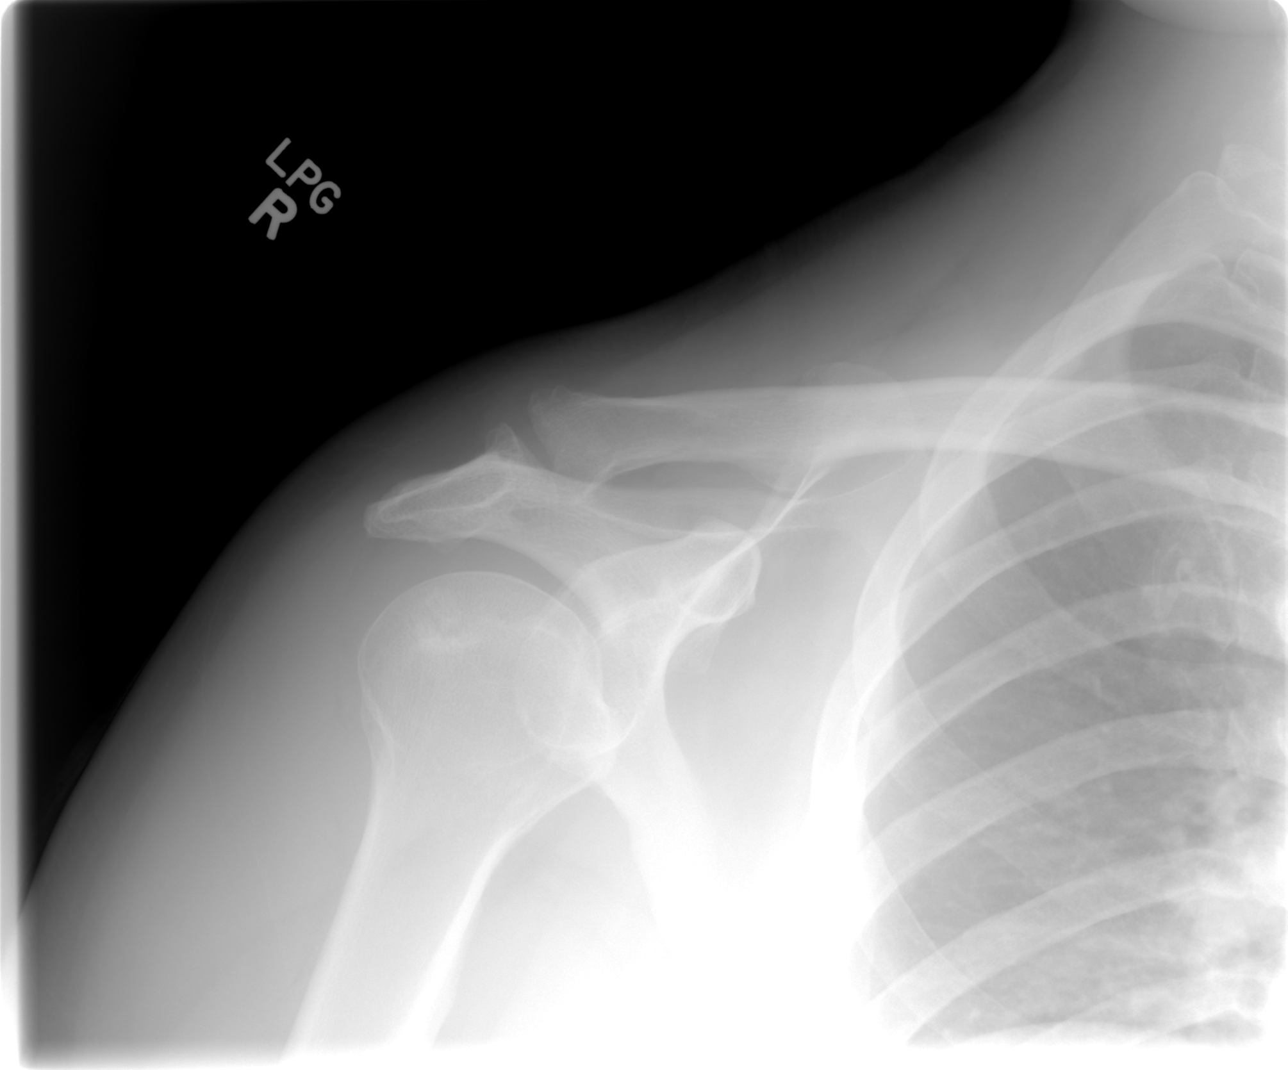

[view not recorded (2 of 3)]
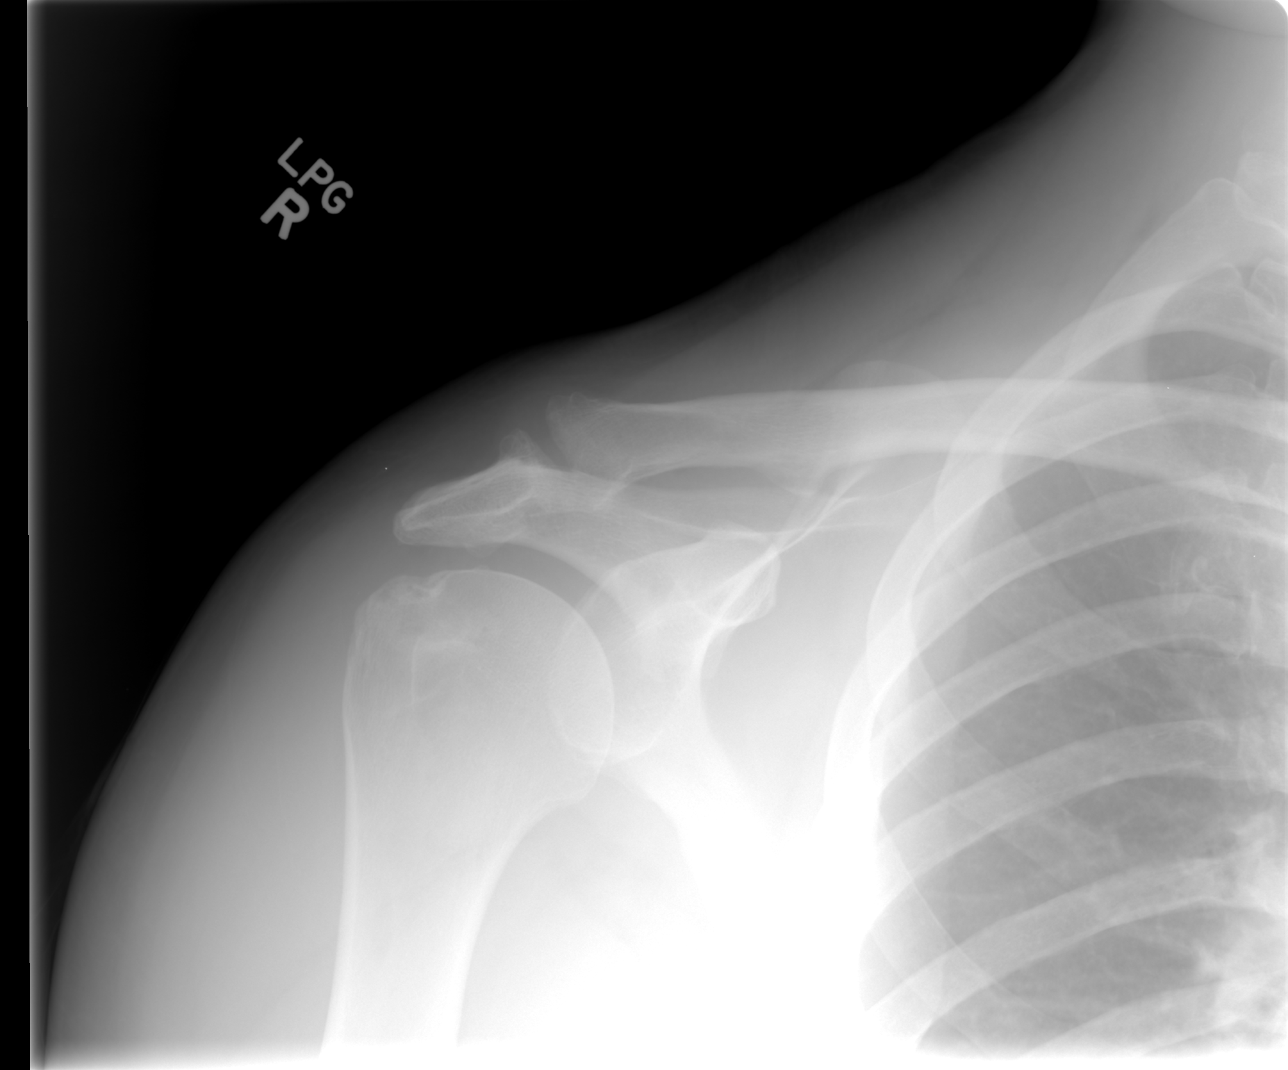

[view not recorded (3 of 3)]
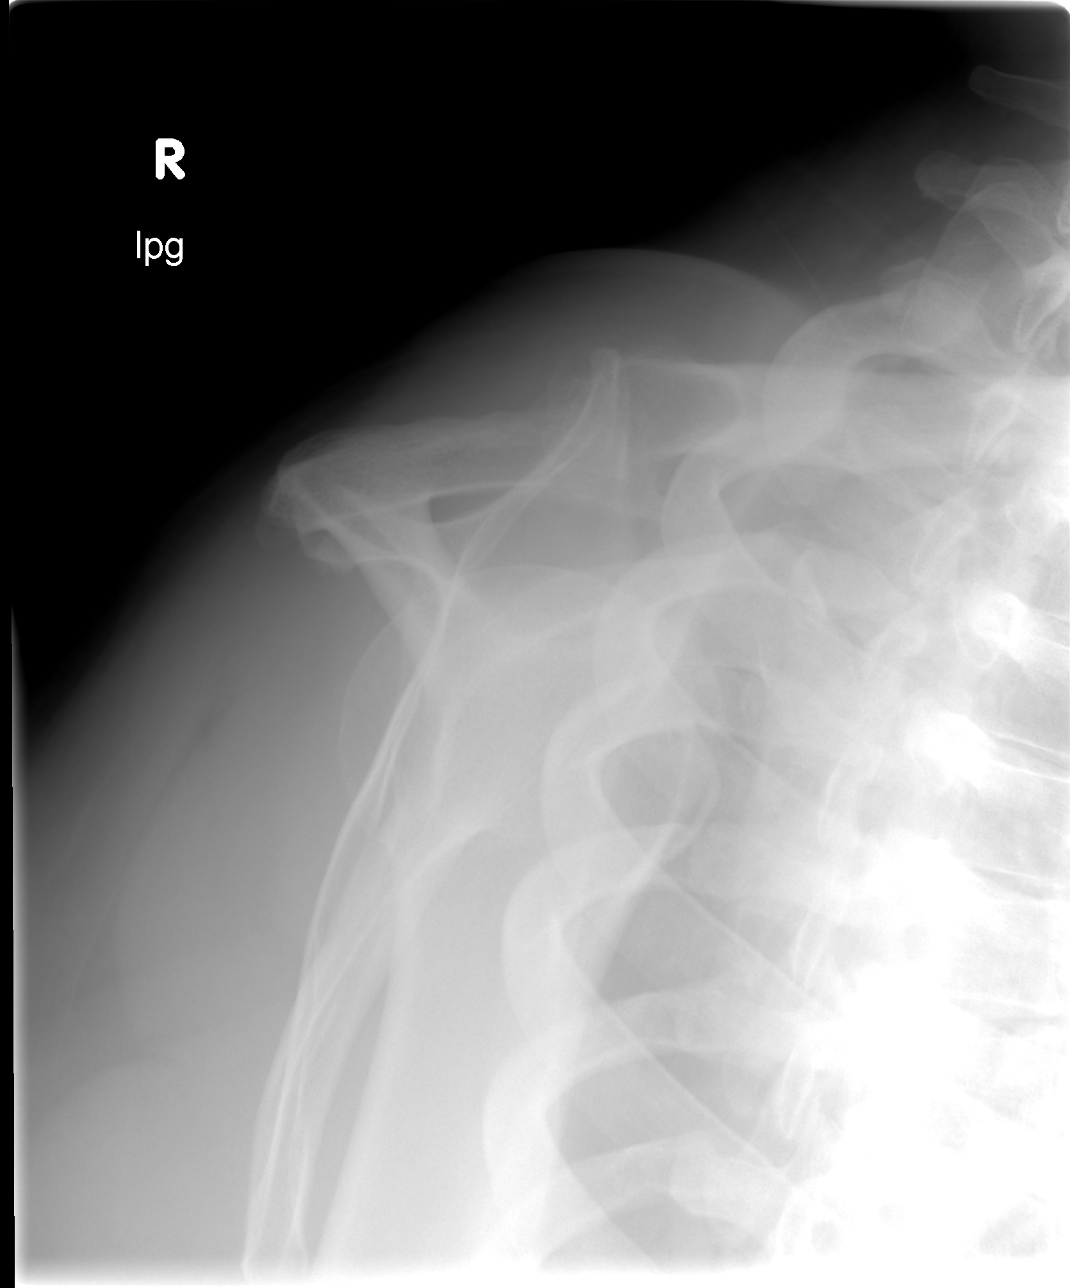

[3 of 3 positions shown; findings below may reference images not displayed]

FINDINGS: No fracture or dislocation.  Mild degenerative change of the AC
joint is suspected with joint space loss, subchondral sclerosis and
minimal inferiorly-directed osteophytosis.  Minimal enthesopathic
change of the rotator cuff insertion site.  No evidence of calcific
tendonitis.  Limited visualization of adjacent thorax normal.
Regional soft tissues are normal.
IMPRESSION: 1.  No acute findings.
2.  Suspected mild degenerative change at the AC joint.
3.  Enthesopathic change of the rotator cuff insertion site,
nonspecific but could be seen in the setting of rotator cuff
pathology.  Further evaluation with shoulder MRI may be obtained as
clinically indicated.

## 2013-10-16 ENCOUNTER — Other Ambulatory Visit: Payer: Self-pay | Admitting: Family Medicine

## 2013-10-29 NOTE — Telephone Encounter (Signed)
Discussed tramadol use w/ pt at last appt.  Pt needs appt prior to refill as outlined on 09/15/13

## 2013-11-04 ENCOUNTER — Ambulatory Visit: Payer: Self-pay | Admitting: Family Medicine

## 2013-11-07 ENCOUNTER — Encounter: Payer: Self-pay | Admitting: Family Medicine

## 2013-11-07 ENCOUNTER — Ambulatory Visit (INDEPENDENT_AMBULATORY_CARE_PROVIDER_SITE_OTHER): Payer: 59 | Admitting: Family Medicine

## 2013-11-07 VITALS — BP 136/80 | HR 87 | Temp 98.2°F | Wt 226.0 lb

## 2013-11-07 DIAGNOSIS — K219 Gastro-esophageal reflux disease without esophagitis: Secondary | ICD-10-CM

## 2013-11-07 DIAGNOSIS — I10 Essential (primary) hypertension: Secondary | ICD-10-CM

## 2013-11-07 DIAGNOSIS — R209 Unspecified disturbances of skin sensation: Secondary | ICD-10-CM

## 2013-11-07 DIAGNOSIS — M549 Dorsalgia, unspecified: Secondary | ICD-10-CM

## 2013-11-07 DIAGNOSIS — R202 Paresthesia of skin: Secondary | ICD-10-CM

## 2013-11-07 DIAGNOSIS — R200 Anesthesia of skin: Secondary | ICD-10-CM

## 2013-11-07 MED ORDER — METAXALONE 800 MG PO TABS: 400.0000 mg | ORAL_TABLET | Freq: Three times a day (TID) | ORAL | Status: DC

## 2013-11-07 MED ORDER — TRAMADOL HCL 50 MG PO TABS: ORAL_TABLET | ORAL | Status: DC

## 2013-11-07 MED ORDER — GABAPENTIN 100 MG PO CAPS: 100.0000 mg | ORAL_CAPSULE | Freq: Three times a day (TID) | ORAL | Status: DC

## 2013-11-07 NOTE — Patient Instructions (Signed)
You are doing well overall Your shoulder pain is likely radicular pain from a pinched nerve in your neck or shoulder P{lease star the daily neck exercises Pelase start the gabapentin at night then increase to every 8 hours Please [place] eht skelaxin and tramadol as needed for the pain and tightness in your neck   Radicular Pain Radicular pain in either the arm or leg is usually from a bulging or herniated disk in the spine. A piece of the herniated disk may press against the nerves as the nerves exit the spine. This causes pain which is felt at the tips of the nerves down the arm or leg. Other causes of radicular pain may include:  Fractures.  Heart disease.  Cancer.  An abnormal and usually degenerative state of the nervous system or nerves (neuropathy). Diagnosis may require CT or MRI scanning to determine the primary cause.  Nerves that start at the neck (nerve roots) may cause radicular pain in the outer shoulder and arm. It can spread down to the thumb and fingers. The symptoms vary depending on which nerve root has been affected. In most cases radicular pain improves with conservative treatment. Neck problems may require physical therapy, a neck collar, or cervical traction. Treatment may take many weeks, and surgery may be considered if the symptoms do not improve.  Conservative treatment is also recommended for sciatica. Sciatica causes pain to radiate from the lower back or buttock area down the leg into the foot. Often there is a history of back problems. Most patients with sciatica are better after 2 to 4 weeks of rest and other supportive care. Short term bed rest can reduce the disk pressure considerably. Sitting, however, is not a good position since this increases the pressure on the disk. You should avoid bending, lifting, and all other activities which make the problem worse. Traction can be used in severe cases. Surgery is usually reserved for patients who do not improve within the  first months of treatment. Only take over-the-counter or prescription medicines for pain, discomfort, or fever as directed by your caregiver. Narcotics and muscle relaxants may help by relieving more severe pain and spasm and by providing mild sedation. Cold or massage can give significant relief. Spinal manipulation is not recommended. It can increase the degree of disc protrusion. Epidural steroid injections are often effective treatment for radicular pain. These injections deliver medicine to the spinal nerve in the space between the protective covering of the spinal cord and back bones (vertebrae). Your caregiver can give you more information about steroid injections. These injections are most effective when given within two weeks of the onset of pain.  You should see your caregiver for follow up care as recommended. A program for neck and back injury rehabilitation with stretching and strengthening exercises is an important part of management.  SEEK IMMEDIATE MEDICAL CARE IF:  You develop increased pain, weakness, or numbness in your arm or leg.  You develop difficulty with bladder or bowel control.  You develop abdominal pain. Document Released: 08/10/2004 Document Revised: 09/25/2011 Document Reviewed: 10/26/2008 [facility] Patient Information 2014 [place], [place].

## 2013-11-07 NOTE — Assessment & Plan Note (Signed)
At goal.  No change 

## 2013-11-07 NOTE — Assessment & Plan Note (Addendum)
Radicular pain possibly from ce4rvical radiculopathy/impingment vs shoulder injury Pt deferring Xray at this time Cont tramadol prn Start gabapentin 100 QHS the TID (may inc 300mg  TID) Flexeril for tight pericpinal cervical muscles Neck exercises

## 2013-11-07 NOTE — Assessment & Plan Note (Signed)
Pt to use tramadol sparingly  Refill given

## 2013-11-07 NOTE — Progress Notes (Signed)
Jim Hunter is a 48 y.o. male who presents to [facility] today for shoulder pain. And knee pain.   Knee pain and [place] pain: present for a couple months. Seeing ocupational health and another provider for this pain. May be going to ortho soon. No treatment necessary from our standpoint.   HTN: at home in the 120-130SBP and dBP in 80-90 range.  Obesity: improved after stopping ETOH. exercising   L Radicular pain: No better since December. purely positional. Comes on after staying in certain positions for prolonged time. Improves w/ certain movements. L rotator cuff repair in 2010  GERD: using prilosec only 1 month. Aware of triggers.   The following portions of the patient's history were reviewed and updated as appropriate: allergies, current medications, past medical history, family and social history, and problem list.  Patient is a nonsmoker.  Past Medical History  Diagnosis Date  . Hypertension   . Hyperlipidemia   . Sleep apnea   . Arthritis     ROS as above otherwise neg.    Medications reviewed. Current Outpatient Prescriptions  Medication Sig Dispense Refill  . etodolac (LODINE) 400 MG tablet TAKE 1 CAPSULE BY MOUTH twice a day AS NEEDED (PAIN. TAKE WITH FOOD. )  60 tablet  3  . ezetimibe (ZETIA) 10 MG tablet Take 1 tablet (10 mg total) by mouth daily.  90 tablet  3  . gabapentin (NEURONTIN) 100 MG capsule Take 1 capsule (100 mg total) by mouth 3 (three) times daily.  90 capsule  3  . lisinopril-hydrochlorothiazide (PRINZIDE,ZESTORETIC) 20-12.5 MG per tablet Take 1 tablet by mouth daily.  180 tablet  1  . metaxalone (SKELAXIN) 800 MG tablet Take 0.5 tablets (400 mg total) by mouth 3 (three) times daily.  90 tablet  3  . omeprazole (PRILOSEC) 20 MG capsule Take 2 capsules (40 mg total) by mouth daily.  90 capsule  1  . sildenafil (VIAGRA) 100 MG tablet Take 1 tablet (100 mg total) by mouth daily as needed for erectile dysfunction.  10 tablet  3  . traMADol (ULTRAM) 50 MG tablet  TAKE 1 TABLET BY MOUTH EVERY 6 HOURS AS NEEDED  30 tablet  0   No current facility-administered medications for this visit.    Exam:  BP 136/80  Pulse 87  Temp(Src) 98.2 F (36.8 C) (Oral)  Wt 226 lb (102.513 kg) Gen: Well NAD HEENT: EOMI,  MMM MSK: [place] + on L  No results found for this or any previous visit (from the past 72 hour(s)).  A/P (as seen in Problem list)  Hypertension At goal. No change  GERD (gastroesophageal reflux disease) Cont prilosec PRN  Numbness and tingling in left arm Radicular pain possibly from ce4rvical radiculopathy/impingment vs shoulder injury Pt deferring Xray at this time Cont tramadol prn Start gabapentin 100 QHS the TID (may inc 300mg  TID) Flexeril for tight pericpinal cervical muscles   Back pain Pt to use tramadol sparingly  Refill given

## 2013-11-07 NOTE — Addendum Note (Signed)
Addended by: Waldemar Dickens on: 11/07/2013 03:45 PM   Modules accepted: Orders

## 2013-11-07 NOTE — Assessment & Plan Note (Signed)
Cont prilosec PRN

## 2013-12-17 ENCOUNTER — Other Ambulatory Visit: Payer: Self-pay | Admitting: Family Medicine

## 2014-01-07 ENCOUNTER — Encounter: Payer: Self-pay | Admitting: Family Medicine

## 2014-01-07 ENCOUNTER — Ambulatory Visit (INDEPENDENT_AMBULATORY_CARE_PROVIDER_SITE_OTHER): Payer: 59 | Admitting: Family Medicine

## 2014-01-07 VITALS — BP 138/80 | HR 75 | Temp 98.3°F | Ht 69.0 in | Wt 231.0 lb

## 2014-01-07 DIAGNOSIS — M549 Dorsalgia, unspecified: Secondary | ICD-10-CM

## 2014-01-07 DIAGNOSIS — I10 Essential (primary) hypertension: Secondary | ICD-10-CM

## 2014-01-07 MED ORDER — TRAMADOL HCL 50 MG PO TABS: ORAL_TABLET | ORAL | Status: DC

## 2014-01-07 MED ORDER — PREDNISONE 50 MG PO TABS: ORAL_TABLET | ORAL | Status: DC

## 2014-01-07 NOTE — Patient Instructions (Signed)
You are doing well overall Continue to work with the [employer] comp department to get in to see ortho Continue to do daily stretching and try the 7 days of prednisone for relief.  Only use the tramadol as needed

## 2014-01-07 NOTE — Assessment & Plan Note (Signed)
At goal no change 

## 2014-01-07 NOTE — Assessment & Plan Note (Signed)
Multiple site pain but primarily back Unlikely PMR Continue to work through [employer] comp for knee pain Prednisone 7 day course (NSAIDs are not best option given renal function) Tramadol PRN. Pt not abusing

## 2014-01-07 NOTE — Progress Notes (Signed)
Jim Hunter is a 48 y.o. male who presents to [facility] today for f/u  Radiculopathy: resolved. Did not tolerate the skelaxin. Not using NSAIDs due to borderline Cr. Still waiting to see ortho for workmans comp issue.   HTN: Denies CP, SOB, palpitations. Taking lisinopril and HCTZ  The following portions of the patient's history were reviewed and updated as appropriate: allergies, current medications, past medical history, family and social history, and problem list.  Patient is a nonsmoker.  Past Medical History  Diagnosis Date  . Hypertension   . Hyperlipidemia   . Sleep apnea   . Arthritis     ROS as above otherwise neg.    Medications reviewed. Current Outpatient Prescriptions  Medication Sig Dispense Refill  . etodolac (LODINE) 400 MG tablet TAKE 1 CAPSULE BY MOUTH twice a day AS NEEDED (PAIN. TAKE WITH FOOD. )  60 tablet  3  . ezetimibe (ZETIA) 10 MG tablet Take 1 tablet (10 mg total) by mouth daily.  90 tablet  3  . gabapentin (NEURONTIN) 100 MG capsule Take 1 capsule (100 mg total) by mouth 3 (three) times daily.  90 capsule  3  . lisinopril-hydrochlorothiazide (PRINZIDE,ZESTORETIC) 20-12.5 MG per tablet Take 1 tablet by mouth daily.  180 tablet  1  . metaxalone (SKELAXIN) 800 MG tablet Take 0.5 tablets (400 mg total) by mouth 3 (three) times daily.  90 tablet  3  . omeprazole (PRILOSEC) 20 MG capsule Take 2 capsules (40 mg total) by mouth daily.  90 capsule  1  . predniSONE (DELTASONE) 50 MG tablet Take daily with breakfast  7 tablet  0  . traMADol (ULTRAM) 50 MG tablet TAKE 1 TABLET BY MOUTH EVERY 6 HOURS AS NEEDED  30 tablet  0  . VIAGRA 100 MG tablet TAKE 1 TABLET BY MOUTH DAILY AS NEEDED FOR ERECTILE DYSFUNCTION.  10 tablet  3   No current facility-administered medications for this visit.    Exam:  BP 138/80  Pulse 75  Temp(Src) 98.3 F (36.8 C) (Oral)  Ht 5\' 9"  (1.753 m)  Wt 231 lb (104.781 kg)  BMI 34.10 kg/m2 Gen: Well NAD HEENT: EOMI,  MMM   No results found  for this or any previous visit (from the past 75 hour(s)).  A/P (as seen in Problem list)  Hypertension At goal no change  Back pain Multiple site pain but primarily back Unlikely PMR Continue to work through [employer] comp for knee pain Prednisone 7 day course (NSAIDs are not best option given renal function) Tramadol PRN. Pt not abusing

## 2014-05-01 ENCOUNTER — Other Ambulatory Visit: Payer: Self-pay

## 2014-05-26 ENCOUNTER — Ambulatory Visit (INDEPENDENT_AMBULATORY_CARE_PROVIDER_SITE_OTHER): Payer: 59 | Admitting: Family Medicine

## 2014-05-26 ENCOUNTER — Encounter: Payer: Self-pay | Admitting: Family Medicine

## 2014-05-26 VITALS — BP 171/99 | HR 77 | Temp 98.2°F | Wt 235.0 lb

## 2014-05-26 DIAGNOSIS — M1711 Unilateral primary osteoarthritis, right knee: Secondary | ICD-10-CM | POA: Insufficient documentation

## 2014-05-26 DIAGNOSIS — N529 Male erectile dysfunction, unspecified: Secondary | ICD-10-CM

## 2014-05-26 DIAGNOSIS — M179 Osteoarthritis of knee, unspecified: Secondary | ICD-10-CM

## 2014-05-26 DIAGNOSIS — R220 Localized swelling, mass and lump, head: Secondary | ICD-10-CM

## 2014-05-26 DIAGNOSIS — I10 Essential (primary) hypertension: Secondary | ICD-10-CM

## 2014-05-26 MED ORDER — FLUTICASONE PROPIONATE 50 MCG/ACT NA SUSP: 2.0000 | Freq: Every day | NASAL | Status: DC

## 2014-05-26 MED ORDER — VIAGRA 100 MG PO TABS: ORAL_TABLET | ORAL | Status: DC

## 2014-05-26 NOTE — Patient Instructions (Signed)
It was great seeing you today.   1. Blood pressure: come back in about 2 weeks for a nursing Bp visit. While at work, write down any blood pressures you take.    If we did any lab work today, if it is normal you can expect a letter in the mail.  Please bring all your medications to every doctors visit  Sign up for My Chart to have easy access to your labs results, and communication with your Primary care physician.    Please check-out at the front desk before leaving the clinic.   I look forward to talking with you again at our next visit. If you have any questions or concerns before then, please call the clinic at [phone].  Take Care,   Dr. Tawanna Sat

## 2014-05-26 NOTE — Assessment & Plan Note (Signed)
Followed by ortho, planning on surgery. Takes tramadol sporadically (less than 30 tabs in 5 months). Currently stable. No changes today.

## 2014-05-26 NOTE — Assessment & Plan Note (Signed)
Not at goal today. Had fairly consistently under target BPs since last November when lisinopril-hctz was stopped.  P: nursing visit in 2-3 weeks, record home BPs. If still elevated will need to discuss starting BP med.

## 2014-05-26 NOTE — Progress Notes (Signed)
   Subjective:    Patient ID: Jim Hunter, male    DOB: [birth year], 48 y.o.   MRN: [id number]  HPI  CC: follow up  # Knee pain:  Followed by Dr. Veverly Fells of [facility] orthopedics, in process of getting knee replacement (currently held up by insurance)  Stable, no new concerns  Takes tramadol every now and then. States he needs to take 2 tablets when he does take it, but doesn't like to take it often since he remains sleepy for the next 1-2 days. Last refill 01/07/14 #30, pt still says he has tablets left and is not requesting refill.  # Hypertension  He says he was taken off the lisinopril-hctz since 05/2013.   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  Thinks it is high today because he has been eating out recently since it was his wife's birthday ROS: denies CP, SOB, headache, changes in vision.  # Med refill  Requests refill on flonase for nasal swelling, viagra.   Review of Systems   See HPI for ROS. All other systems reviewed and are negative.  Past medical history, surgical, family, and social history reviewed and updated in the EMR as appropriate. Objective:  BP 171/99 mmHg  Pulse 77  Temp(Src) 98.2 F (36.8 C) (Oral)  Wt 235 lb (106.595 kg) Vitals reviewed  General: NAD CV: RRR, normal s1s2, no mrg. 2+ radial and PT pulses bilaterally Resp: CTAB, normal effort Ext: no edema or swelling. Knees mildly tender bilaterally, FROM.  Assessment & Plan:  See Problem List Documentation

## 2014-06-17 ENCOUNTER — Ambulatory Visit: Payer: Self-pay | Admitting: Family Medicine

## 2014-07-03 ENCOUNTER — Encounter: Payer: Self-pay | Admitting: Family Medicine

## 2014-07-03 ENCOUNTER — Ambulatory Visit (INDEPENDENT_AMBULATORY_CARE_PROVIDER_SITE_OTHER): Payer: 59 | Admitting: Family Medicine

## 2014-07-03 VITALS — BP 149/103 | HR 71 | Temp 98.3°F | Ht 69.0 in | Wt 230.8 lb

## 2014-07-03 DIAGNOSIS — Z7289 Other problems related to lifestyle: Secondary | ICD-10-CM

## 2014-07-03 DIAGNOSIS — Z789 Other specified health status: Secondary | ICD-10-CM

## 2014-07-03 DIAGNOSIS — F1099 Alcohol use, unspecified with unspecified alcohol-induced disorder: Secondary | ICD-10-CM

## 2014-07-03 DIAGNOSIS — I10 Essential (primary) hypertension: Secondary | ICD-10-CM

## 2014-07-03 MED ORDER — LISINOPRIL 20 MG PO TABS: 20.0000 mg | ORAL_TABLET | Freq: Every day | ORAL | Status: DC

## 2014-07-03 NOTE — Progress Notes (Signed)
   Subjective:    Patient ID: Jim Hunter, male    DOB: [birth year], 48 y.o.   MRN: [id number]  HPI  CC: follow up  # Hypertension:  Has not been checking BP as much as normal due to being busy before the holidays  Diastolic typically >78, often in 100s ROS: no HA, no changes in vision, no change in urinary frequency  # Alcohol use  States he has one 40oz every other day. This is less than he used to in the past.  Occasionally gets "nagged" at by wife for drinking  Says he doesn't drink as much as his friends, not as much as a typical "latino"  CAGE: 2/4 (annoyed, cut back)  Has used other substances in the past (cocaine) but says he   Review of Systems   See HPI for ROS. All other systems reviewed and are negative.  Past medical history, surgical, family, and social history reviewed and updated in the EMR as appropriate. Objective:  BP 149/103 mmHg  Pulse 71  Temp(Src) 98.3 F (36.8 C) (Oral)  Ht 5\' 9"  (1.753 m)  Wt 230 lb 12.8 oz (104.69 kg)  BMI 34.07 kg/m2 Vitals reviewed  General: NAD HEENT: PERRL, EOMI. CV: RRR, normal heart sounds, no murmurs. 2+ radial pulses bilaterally Resp: CTAB, normal effort Ext: no edema or cyanosis Neuro: alert and oriented, no focal deficits.  Assessment & Plan:  See Problem List Documentation

## 2014-07-03 NOTE — Patient Instructions (Signed)
It was great seeing you today.   1. Start lisinopril 20mg  once a day 2. Follow up in 1 month, we will do lab work at that time.    If we did any lab work today, if it is normal you can expect a letter in the mail.  Please bring all your medications to every doctors visit  Sign up for My Chart to have easy access to your labs results, and communication with your Primary care physician.    Please check-out at the front desk before leaving the clinic.   I look forward to talking with you again at our next visit. If you have any questions or concerns before then, please call the clinic at [phone].  Take Care,   Dr. Tawanna Sat

## 2014-07-06 DIAGNOSIS — Z789 Other specified health status: Secondary | ICD-10-CM | POA: Insufficient documentation

## 2014-07-06 DIAGNOSIS — Z7289 Other problems related to lifestyle: Secondary | ICD-10-CM | POA: Insufficient documentation

## 2014-07-06 NOTE — Assessment & Plan Note (Signed)
Again not at goal. Largely diastolic elevated based on history and chart review. Was on lisinopril-hctz 20mg -12.5mg  for several years. Plan: restart lisinopril 20mg . F/u 1 month.

## 2014-07-06 NOTE — Assessment & Plan Note (Signed)
At today's visit and 1 month ago pt made several passing jokes regarding alcohol and substance use. Further discussed this today, pt may have an issue with abuse but not much in way of feeling need to change at this point in time.  Plan: continue to discuss this at f/u visits. For ACEi labwork consider making it CMP rather than BMP.

## 2014-08-10 ENCOUNTER — Ambulatory Visit: Payer: Self-pay | Admitting: Family Medicine

## 2014-08-11 ENCOUNTER — Other Ambulatory Visit: Payer: Self-pay | Admitting: *Deleted

## 2014-08-11 DIAGNOSIS — K219 Gastro-esophageal reflux disease without esophagitis: Secondary | ICD-10-CM

## 2014-08-11 MED ORDER — OMEPRAZOLE 20 MG PO CPDR: 20.0000 mg | DELAYED_RELEASE_CAPSULE | Freq: Every day | ORAL | Status: DC

## 2014-09-02 ENCOUNTER — Encounter: Payer: Self-pay | Admitting: Family Medicine

## 2014-09-02 ENCOUNTER — Ambulatory Visit (INDEPENDENT_AMBULATORY_CARE_PROVIDER_SITE_OTHER): Payer: 59 | Admitting: Family Medicine

## 2014-09-02 VITALS — BP 130/80 | HR 68 | Ht 69.0 in | Wt 232.0 lb

## 2014-09-02 DIAGNOSIS — M179 Osteoarthritis of knee, unspecified: Secondary | ICD-10-CM

## 2014-09-02 DIAGNOSIS — M549 Dorsalgia, unspecified: Secondary | ICD-10-CM

## 2014-09-02 DIAGNOSIS — K219 Gastro-esophageal reflux disease without esophagitis: Secondary | ICD-10-CM

## 2014-09-02 DIAGNOSIS — M1711 Unilateral primary osteoarthritis, right knee: Secondary | ICD-10-CM

## 2014-09-02 DIAGNOSIS — H6122 Impacted cerumen, left ear: Secondary | ICD-10-CM

## 2014-09-02 DIAGNOSIS — I10 Essential (primary) hypertension: Secondary | ICD-10-CM

## 2014-09-02 DIAGNOSIS — H6120 Impacted cerumen, unspecified ear: Secondary | ICD-10-CM | POA: Insufficient documentation

## 2014-09-02 LAB — BASIC METABOLIC PANEL
BUN: 11 mg/dL (ref 6–23)
CO2: 26 mEq/L (ref 19–32)
Calcium: 9.3 mg/dL (ref 8.4–10.5)
Chloride: 105 mEq/L (ref 96–112)
Creat: 0.89 mg/dL (ref 0.50–1.35)
Glucose, Bld: 86 mg/dL (ref 70–99)
POTASSIUM: 4 meq/L (ref 3.5–5.3)
Sodium: 139 mEq/L (ref 135–145)

## 2014-09-02 MED ORDER — TRAMADOL HCL 50 MG PO TABS: ORAL_TABLET | ORAL | Status: DC

## 2014-09-02 NOTE — Assessment & Plan Note (Signed)
At goal today. Reports significant dietary modifications with lent, doubtful he will continue this past Easter but strongly encouraged him. Adhering to lisinopril, reports no missed doses. Will check Bmet today for monitoring and for surgical clearance.

## 2014-09-02 NOTE — Assessment & Plan Note (Signed)
Going to speak with surgeon in 2 days regarding scheduling TKR. If BMP is okay, cleared for surgery from PCP standpoint.

## 2014-09-02 NOTE — Patient Instructions (Signed)
Your blood pressure is great today.  Continue lisinopril. Continue your current diet. We will check your blood work today. If that is okay, no issues for surgery.

## 2014-09-02 NOTE — Assessment & Plan Note (Signed)
Refill omeprazole 90 day supply sent 1 month ago. Asked pt to call if he is unable to pick this up and will resend.

## 2014-09-02 NOTE — Assessment & Plan Note (Signed)
Cleaned left ear canal. Still has 1-2 hairs growing in canal likely causing his itchiness, however do not feel able to remove this (attempt with curette). Encouraged daily rinsing, avoid q-tips into the ear canal.

## 2014-09-02 NOTE — Progress Notes (Signed)
   Subjective:    Patient ID: Jim Hunter, male    DOB: [birth year], 49 y.o.   MRN: [id number]  HPI  CC: surgical clearance  # Hypertension:  Has significantly changed diet for lent: avoiding chocolate, sodas, alcohol  Reports taking his lisinopril ROS: No CP, no dizziness, no SOB  # Knee pain / surgical clearance  Appt on Friday with surgeon  Anticipating total knee replacement  Requests refill of tramadol (asks for 90 day supply), uses mostly at night to help with pain while he sleeps  # GERD  Asks for refill on omeprazole, uses as needed. Refill for 90 day supply was sent on 08/11/14 but he says he didn't pick it up  Review of Systems   See HPI for ROS. All other systems reviewed and are negative.  Past medical history, surgical, family, and social history reviewed and updated in the EMR as appropriate. Objective:  BP 130/80 mmHg  Pulse 68  Ht 5\' 9"  (1.753 m)  Wt 232 lb (105.235 kg)  BMI 34.24 kg/m2 Vitals and nursing note reviewed  General: NAD HEENT: TMs pearly gray bilaterally (scar on left TM but no perforation), noted hair growing in left ear canal, attempted removal with curette but unable to remove, did get some cerumen cleared. CV: RRR, normal S1 and S2, no m/r/g. 2+ radial and PT pulses bilaterally Resp: CTAB, normal effort Ext: No edema or cyanosis Neuro: alert and oriented, no focal deficits  Assessment & Plan:  See Problem List Documentation

## 2014-09-07 ENCOUNTER — Other Ambulatory Visit: Payer: Self-pay | Admitting: Family Medicine

## 2014-09-07 DIAGNOSIS — I10 Essential (primary) hypertension: Secondary | ICD-10-CM

## 2014-09-17 NOTE — Progress Notes (Signed)
Please put orders in Epic surgery 09-29-14 pre op 09-22-14 Thanks

## 2014-09-18 NOTE — H&P (Signed)
TOTAL KNEE ADMISSION H&P  Patient is being admitted for right total knee arthroplasty.  Subjective:  Chief Complaint:   Right knee post-traumatic OA / pain.  HPI: Jim Hunter, 49 y.o. male, has a history of pain and functional disability in the right knee due to trauma and arthritis and has failed non-surgical conservative treatments for greater than 12 weeks to include NSAID's and/or analgesics, corticosteriod injections, viscosupplementation injections and activity modification.  Onset of symptoms was abrupt, starting ~4 years ago after he got it caught in a palette and twisting with gradually worsening course since that time. The patient noted no past surgery on the right knee(s).  Patient currently rates pain in the right knee(s) at 10 out of 10 with activity. Patient has night pain, worsening of pain with activity and weight bearing, pain that interferes with activities of daily living, pain with passive range of motion, crepitus and joint swelling.  Patient has evidence of periarticular osteophytes and joint space narrowing by imaging studies.  There is no active infection.  Risks, benefits and expectations were discussed with the patient.  Risks including but not limited to the risk of anesthesia, blood clots, nerve damage, blood vessel damage, failure of the prosthesis, infection and up to and including death.  Patient understand the risks, benefits and expectations and wishes to proceed with surgery.   PCP: Jim Sat, MD  D/C Plans:      Home with HHPT  Post-op Meds:       No Rx given  Tranexamic Acid:      To be given - IV   Decadron:      Is to be given  FYI:     ASA post-op  Norco post-op    Patient Active Problem List   Diagnosis Date Noted  . Excessive cerumen in ear canal 09/02/2014  . Alcohol use 07/06/2014  . Osteoarthritis of right knee 05/26/2014  . Erectile dysfunction 05/26/2014  . Numbness and tingling in left arm 07/02/2013  . Sensation of fullness in left  ear 05/02/2013  . Back pain 05/02/2013  . GERD (gastroesophageal reflux disease) 10/02/2012  . Umbilical hernia 41/74/0814  . Hypertension 07/29/2012  . Hyperlipidemia 07/29/2012  . Obesity (BMI 30-39.9) 07/29/2012   Past Medical History  Diagnosis Date  . Hypertension   . Hyperlipidemia   . Sleep apnea   . Arthritis     Past Surgical History  Procedure Laterality Date  . Rotator cuff repair  2010    left  . Lumbar disc surgery  2006  . Tympanoplasty      age 36    No prescriptions prior to admission   Allergies  Allergen Reactions  . Crestor [Rosuvastatin]   . Lipitor [Atorvastatin Calcium]     History  Substance Use Topics  . Smoking status: Former Smoker -- 10.00 packs/day  . Smokeless tobacco: Never Used  . Alcohol Use: Yes     Comment: [place]     Family History  Problem Relation Age of Onset  . Hypertension Mother   . Hypertension Sister   . Hypertension Brother   . Hypertension Maternal Grandmother      Review of Systems  Constitutional: Negative.   HENT: Negative.   Eyes: Negative.   Respiratory: Negative.   Cardiovascular: Negative.   Gastrointestinal: Positive for heartburn.  Genitourinary: Negative.   Musculoskeletal: Positive for back pain and joint pain.  Skin: Negative.   Neurological: Negative.   Endo/Heme/Allergies: Negative.   Psychiatric/Behavioral: Negative.  Objective:  Physical Exam  Constitutional: He is oriented to person, place, and time. He appears well-developed and well-nourished.  HENT:  Head: Normocephalic and atraumatic.  Eyes: Pupils are equal, round, and reactive to light.  Neck: Neck supple. No JVD present. No tracheal deviation present. No thyromegaly present.  Cardiovascular: Normal rate, regular rhythm, normal heart sounds and intact distal pulses.   Respiratory: Effort normal and breath sounds normal. No stridor. No respiratory distress. He has no wheezes.  GI: Soft. There is no tenderness. There is no  guarding.  Musculoskeletal:       Right knee: He exhibits decreased range of motion, swelling and bony tenderness. He exhibits no ecchymosis, no deformity, no laceration, no erythema and normal alignment. Tenderness found.  Lymphadenopathy:    He has no cervical adenopathy.  Neurological: He is alert and oriented to person, place, and time.  Skin: Skin is warm and dry.  Psychiatric: He has a normal mood and affect.     Labs:  Estimated body mass index is 34.24 kg/(m^2) as calculated from the following:   Height as of 09/02/14: 5\' 9"  (1.753 m).   Weight as of 09/02/14: 105.235 kg (232 lb).   Imaging Review Plain radiographs demonstrate moderate degenerative joint disease of the right knee(s). The overall alignment is neutral. The bone quality appears to be good for age and reported activity level.  Assessment/Plan:  End stage arthritis, right knee   The patient history, physical examination, clinical judgment of the provider and imaging studies are consistent with end stage degenerative joint disease of the right knee(s) and total knee arthroplasty is deemed medically necessary. The treatment options including medical management, injection therapy arthroscopy and arthroplasty were discussed at length. The risks and benefits of total knee arthroplasty were presented and reviewed. The risks due to aseptic loosening, infection, stiffness, patella tracking problems, thromboembolic complications and other imponderables were discussed. The patient acknowledged the explanation, agreed to proceed with the plan and consent was signed. Patient is being admitted for inpatient treatment for surgery, pain control, PT, OT, prophylactic antibiotics, VTE prophylaxis, progressive ambulation and ADL's and discharge planning. The patient is planning to be discharged home with home health services.      Jim Pugh Jkayla Spiewak   PA-C  09/18/2014, 1:03 PM

## 2014-09-21 ENCOUNTER — Other Ambulatory Visit (HOSPITAL_COMMUNITY): Payer: Self-pay | Admitting: *Deleted

## 2014-09-21 NOTE — Patient Instructions (Addendum)
Jim Hunter  09/21/2014   Your procedure is scheduled on: Tuesday February 15th, 2016  Report to [facility] Main  Entrance and follow signs to               [place] at 955  AM.  Call this number if you have problems the morning of surgery [phone]   Remember:  Do not eat food or drink liquids :After Midnight.     Take these medicines the morning of surgery with A SIP OF WATER: Flonase Nasal Spray                               You may not have any metal on your body including hair pins and              piercings  Do not wear jewelry, make-up, lotions, powders or perfumes.             Do not wear nail polish.  Do not shave  48 hours prior to surgery.              Men may shave face and neck.   Do not bring valuables to the hospital. [place].  Contacts, dentures or bridgework may not be worn into surgery.  Leave suitcase in the car. After surgery it may be brought to your room.                  Please read over the following fact sheets you were given: _____________________________________________________________________             [address] [facility] - Preparing for Surgery Before surgery, you can play an important role.  Because skin is not sterile, your skin needs to be as free of germs as possible.  You can reduce the number of germs on your skin by washing with CHG (chlorahexidine gluconate) soap before surgery.  CHG is an antiseptic cleaner which kills germs and bonds with the skin to continue killing germs even after washing. Please DO NOT use if you have an allergy to CHG or antibacterial soaps.  If your skin becomes reddened/irritated stop using the CHG and inform your nurse when you arrive at Short Stay. Do not shave (including legs and underarms) for at least 48 hours prior to the first CHG shower.  You may shave your face/neck. Please follow these instructions carefully:  1.  Shower with CHG  Soap the night before surgery and the  morning of Surgery.  2.  If you choose to wash your hair, wash your hair first as usual with your  normal  shampoo.  3.  After you shampoo, rinse your hair and body thoroughly to remove the  shampoo.                           4.  Use CHG as you would any other liquid soap.  You can apply chg directly  to the skin and wash                       Gently with a scrungie or clean washcloth.  5.  Apply the CHG Soap to your body ONLY FROM THE NECK DOWN.  Do not use on face/ open                           Wound or open sores. Avoid contact with eyes, ears mouth and genitals (private parts).                       Wash face,  Genitals (private parts) with your normal soap.             6.  Wash thoroughly, paying special attention to the area where your surgery  will be performed.  7.  Thoroughly rinse your body with warm water from the neck down.  8.  DO NOT shower/wash with your normal soap after using and rinsing off  the CHG Soap.                9.  Pat yourself dry with a clean towel.            10.  Wear clean pajamas.            11.  Place clean sheets on your bed the night of your first shower and do not  sleep with pets. Day of Surgery : Do not apply any lotions/deodorants the morning of surgery.  Please wear clean clothes to the hospital/surgery center.  FAILURE TO FOLLOW THESE INSTRUCTIONS MAY RESULT IN THE CANCELLATION OF YOUR SURGERY PATIENT SIGNATURE_________________________________  NURSE SIGNATURE__________________________________  ________________________________________________________________________   Jim Hunter  An incentive spirometer is a tool that can help keep your lungs clear and active. This tool measures how well you are filling your lungs with each breath. Taking long deep breaths may help reverse or decrease the chance of developing breathing (pulmonary) problems (especially infection) following:  A long period of time  when you are unable to move or be active. BEFORE THE PROCEDURE   If the spirometer includes an indicator to show your best effort, your nurse or respiratory therapist will set it to a desired goal.  If possible, sit up straight or lean slightly forward. Try not to slouch.  Hold the incentive spirometer in an upright position. INSTRUCTIONS FOR USE   Sit on the edge of your bed if possible, or sit up as far as you can in bed or on a chair.  Hold the incentive spirometer in an upright position.  Breathe out normally.  Place the mouthpiece in your mouth and seal your lips tightly around it.  Breathe in slowly and as deeply as possible, raising the piston or the ball toward the top of the column.  Hold your breath for 3-5 seconds or for as long as possible. Allow the piston or ball to fall to the bottom of the column.  Remove the mouthpiece from your mouth and breathe out normally.  Rest for a few seconds and repeat Steps 1 through 7 at least 10 times every 1-2 hours when you are awake. Take your time and take a few normal breaths between deep breaths.  The spirometer may include an indicator to show your best effort. Use the indicator as a goal to work toward during each repetition.  After each set of 10 deep breaths, practice coughing to be sure your lungs are clear. If you have an incision (the cut made at the time of surgery), support your incision when coughing by placing a pillow or rolled up towels firmly against it. Once you are able to get out of  bed, walk around indoors and cough well. You may stop using the incentive spirometer when instructed by your caregiver.  RISKS AND COMPLICATIONS  Take your time so you do not get dizzy or light-headed.  If you are in pain, you may need to take or ask for pain medication before doing incentive spirometry. It is harder to take a deep breath if you are having pain. AFTER USE  Rest and breathe slowly and easily.  It can be helpful to  keep track of a log of your progress. Your caregiver can provide you with a simple table to help with this. If you are using the spirometer at home, follow these instructions: [place] IF:   You are having difficultly using the spirometer.  You have trouble using the spirometer as often as instructed.  Your pain medication is not giving enough relief while using the spirometer.  You develop fever of 100.5 F (38.1 C) or higher. SEEK IMMEDIATE MEDICAL CARE IF:   You cough up bloody sputum that had not been present before.  You develop fever of 102 F (38.9 C) or greater.  You develop worsening pain at or near the incision site. MAKE SURE YOU:   Understand these instructions.  Will watch your condition.  Will get help right away if you are not doing well or get worse. Document Released: 11/13/2006 Document Revised: 09/25/2011 Document Reviewed: 01/14/2007 Exit[employer] Patient Information 2014 [employer], [place].   ________________________________________________________________________  WHAT IS A BLOOD TRANSFUSION? Blood Transfusion Information  A transfusion is the replacement of blood or some of its parts. Blood is made up of multiple cells which provide different functions.  Red blood cells carry oxygen and are used for blood loss replacement.  White blood cells fight against infection.  Platelets control bleeding.  Plasma helps clot blood.  Other blood products are available for specialized needs, such as hemophilia or other clotting disorders. BEFORE THE TRANSFUSION  Who gives blood for transfusions?   Healthy volunteers who are fully evaluated to make sure their blood is safe. This is blood bank blood. Transfusion therapy is the safest it has ever been in the practice of medicine. Before blood is taken from a donor, a complete history is taken to make sure that person has no history of diseases nor engages in risky social behavior (examples are intravenous drug  use or sexual activity with multiple partners). The donor's travel history is screened to minimize risk of transmitting infections, such as malaria. The donated blood is tested for signs of infectious diseases, such as HIV and hepatitis. The blood is then tested to be sure it is compatible with you in order to minimize the chance of a transfusion reaction. If you or a relative donates blood, this is often done in anticipation of surgery and is not appropriate for emergency situations. It takes many days to process the donated blood. RISKS AND COMPLICATIONS Although transfusion therapy is very safe and saves many lives, the main dangers of transfusion include:   Getting an infectious disease.  Developing a transfusion reaction. This is an allergic reaction to something in the blood you were given. Every precaution is taken to prevent this. The decision to have a blood transfusion has been considered carefully by your caregiver before blood is given. Blood is not given unless the benefits outweigh the risks. AFTER THE TRANSFUSION  Right after receiving a blood transfusion, you will usually feel much better and more energetic. This is especially true if your red blood  cells have gotten low (anemic). The transfusion raises the level of the red blood cells which carry oxygen, and this usually causes an energy increase.  The nurse administering the transfusion will monitor you carefully for complications. HOME CARE INSTRUCTIONS  No special instructions are needed after a transfusion. You may find your energy is better. Speak with your caregiver about any limitations on activity for underlying diseases you may have. SEEK MEDICAL CARE IF:   Your condition is not improving after your transfusion.  You develop redness or irritation at the intravenous (IV) site. SEEK IMMEDIATE MEDICAL CARE IF:  Any of the following symptoms occur over the next 12 hours:  Shaking chills.  You have a temperature by mouth  above 102 F (38.9 C), not controlled by medicine.  Chest, back, or muscle pain.  People around you feel you are not acting correctly or are confused.  Shortness of breath or difficulty breathing.  Dizziness and fainting.  You get a rash or develop hives.  You have a decrease in urine output.  Your urine turns a dark color or changes to pink, red, or brown. Any of the following symptoms occur over the next 10 days:  You have a temperature by mouth above 102 F (38.9 C), not controlled by medicine.  Shortness of breath.  Weakness after normal activity.  The white part of the eye turns yellow (jaundice).  You have a decrease in the amount of urine or are urinating less often.  Your urine turns a dark color or changes to pink, red, or brown. Document Released: 06/30/2000 Document Revised: 09/25/2011 Document Reviewed: 02/17/2008 [facility] Patient Information 2014 [place], [place].  _______________________________________________________________________

## 2014-09-21 NOTE — Progress Notes (Signed)
Your surgery orders are not showing, please put in epic, thanks

## 2014-09-22 ENCOUNTER — Encounter (HOSPITAL_COMMUNITY)
Admission: RE | Admit: 2014-09-22 | Discharge: 2014-09-22 | Disposition: A | Discharge status: Home / Self Care | Payer: PRIVATE HEALTH INSURANCE | Source: Ambulatory Visit | Attending: Orthopedic Surgery | Admitting: Orthopedic Surgery

## 2014-09-22 ENCOUNTER — Encounter (HOSPITAL_COMMUNITY): Payer: Self-pay

## 2014-09-22 DIAGNOSIS — M179 Osteoarthritis of knee, unspecified: Secondary | ICD-10-CM | POA: Insufficient documentation

## 2014-09-22 DIAGNOSIS — Z01818 Encounter for other preprocedural examination: Secondary | ICD-10-CM | POA: Insufficient documentation

## 2014-09-22 HISTORY — DX: Gastro-esophageal reflux disease without esophagitis: K21.9

## 2014-09-22 HISTORY — DX: Encounter for screening for respiratory tuberculosis: Z11.1

## 2014-09-22 LAB — BASIC METABOLIC PANEL
ANION GAP: 7 (ref 5–15)
BUN: 10 mg/dL (ref 6–23)
CALCIUM: 9.2 mg/dL (ref 8.4–10.5)
CO2: 25 mmol/L (ref 19–32)
CREATININE: 0.91 mg/dL (ref 0.50–1.35)
Chloride: 106 mmol/L (ref 96–112)
GFR calc Af Amer: >90 mL/min (ref >90)
GFR calc non Af Amer: >90 mL/min (ref >90)
Glucose, Bld: 97 mg/dL (ref 70–99)
Potassium: 3.8 mmol/L (ref 3.5–5.1)
SODIUM: 138 mmol/L (ref 135–145)

## 2014-09-22 LAB — URINALYSIS, ROUTINE W REFLEX MICROSCOPIC
Bilirubin Urine: NEGATIVE
Glucose, UA: NEGATIVE mg/dL
HGB URINE DIPSTICK: NEGATIVE
KETONES UR: NEGATIVE mg/dL
Leukocytes, UA: NEGATIVE
Nitrite: NEGATIVE
Protein, ur: NEGATIVE mg/dL
Specific Gravity, Urine: 1.013 (ref 1.005–1.030)
Urobilinogen, UA: 1 mg/dL (ref 0.0–1.0)
pH: 6.5 (ref 5.0–8.0)

## 2014-09-22 LAB — CBC
HCT: 43.8 % (ref 39.0–52.0)
Hemoglobin: 15.1 g/dL (ref 13.0–17.0)
MCH: 30.3 pg (ref 26.0–34.0)
MCHC: 34.5 g/dL (ref 30.0–36.0)
MCV: 88 fL (ref 78.0–100.0)
Platelets: 244 10*3/uL (ref 150–400)
RBC: 4.98 MIL/uL (ref 4.22–5.81)
RDW: 14 % (ref 11.5–15.5)
WBC: 5.1 10*3/uL (ref 4.0–10.5)

## 2014-09-22 LAB — APTT: APTT: 32 s (ref 24–37)

## 2014-09-22 LAB — SURGICAL PCR SCREEN
MRSA, PCR: NEGATIVE
Staphylococcus aureus: POSITIVE — AB

## 2014-09-22 LAB — PROTIME-INR
INR: 1.04 (ref 0.00–1.49)
Prothrombin Time: 13.7 seconds (ref 11.6–15.2)

## 2014-09-22 LAB — ABO/RH: ABO/RH(D): O POS

## 2014-09-29 ENCOUNTER — Inpatient Hospital Stay (HOSPITAL_COMMUNITY): Payer: PRIVATE HEALTH INSURANCE | Admitting: Anesthesiology

## 2014-09-29 ENCOUNTER — Encounter (HOSPITAL_COMMUNITY)
Admission: RE | Disposition: A | Discharge status: Home Health | Payer: Self-pay | Source: Ambulatory Visit | Attending: Orthopedic Surgery

## 2014-09-29 ENCOUNTER — Encounter (HOSPITAL_COMMUNITY): Payer: Self-pay | Admitting: *Deleted

## 2014-09-29 ENCOUNTER — Inpatient Hospital Stay (HOSPITAL_COMMUNITY)
Admission: RE | Admit: 2014-09-29 | Discharge: 2014-09-30 | DRG: 470 | Disposition: A | Discharge status: Home Health | Payer: PRIVATE HEALTH INSURANCE | Source: Ambulatory Visit | Attending: Orthopedic Surgery | Admitting: Orthopedic Surgery

## 2014-09-29 DIAGNOSIS — Z01812 Encounter for preprocedural laboratory examination: Secondary | ICD-10-CM

## 2014-09-29 DIAGNOSIS — R202 Paresthesia of skin: Secondary | ICD-10-CM

## 2014-09-29 DIAGNOSIS — Z6833 Body mass index (BMI) 33.0-33.9, adult: Secondary | ICD-10-CM | POA: Diagnosis not present

## 2014-09-29 DIAGNOSIS — Z87891 Personal history of nicotine dependence: Secondary | ICD-10-CM | POA: Diagnosis not present

## 2014-09-29 DIAGNOSIS — M659 Synovitis and tenosynovitis, unspecified: Secondary | ICD-10-CM | POA: Diagnosis present

## 2014-09-29 DIAGNOSIS — K219 Gastro-esophageal reflux disease without esophagitis: Secondary | ICD-10-CM | POA: Diagnosis present

## 2014-09-29 DIAGNOSIS — R200 Anesthesia of skin: Secondary | ICD-10-CM

## 2014-09-29 DIAGNOSIS — G4730 Sleep apnea, unspecified: Secondary | ICD-10-CM | POA: Diagnosis present

## 2014-09-29 DIAGNOSIS — Z96651 Presence of right artificial knee joint: Secondary | ICD-10-CM

## 2014-09-29 DIAGNOSIS — I10 Essential (primary) hypertension: Secondary | ICD-10-CM | POA: Diagnosis present

## 2014-09-29 DIAGNOSIS — M25561 Pain in right knee: Secondary | ICD-10-CM | POA: Diagnosis present

## 2014-09-29 DIAGNOSIS — Z96659 Presence of unspecified artificial knee joint: Secondary | ICD-10-CM

## 2014-09-29 DIAGNOSIS — E669 Obesity, unspecified: Secondary | ICD-10-CM | POA: Diagnosis present

## 2014-09-29 DIAGNOSIS — M1711 Unilateral primary osteoarthritis, right knee: Secondary | ICD-10-CM | POA: Diagnosis present

## 2014-09-29 DIAGNOSIS — E785 Hyperlipidemia, unspecified: Secondary | ICD-10-CM | POA: Diagnosis present

## 2014-09-29 HISTORY — PX: TOTAL KNEE ARTHROPLASTY: SHX125

## 2014-09-29 LAB — TYPE AND SCREEN
ABO/RH(D): O POS
Antibody Screen: NEGATIVE

## 2014-09-29 SURGERY — ARTHROPLASTY, KNEE, TOTAL
Anesthesia: Monitor Anesthesia Care | Site: Knee | Laterality: Right

## 2014-09-29 MED ORDER — METHOCARBAMOL 1000 MG/10ML IJ SOLN
500.0000 mg | Freq: Four times a day (QID) | INTRAMUSCULAR | Status: DC | PRN
  Administered 2014-09-29: 500 mg via INTRAVENOUS
  Filled 2014-09-29 (×2): qty 5

## 2014-09-29 MED ORDER — DEXAMETHASONE SODIUM PHOSPHATE 10 MG/ML IJ SOLN
10.0000 mg | Freq: Once | INTRAMUSCULAR | Status: AC
  Administered 2014-09-30: 10 mg via INTRAVENOUS
  Filled 2014-09-29: qty 1

## 2014-09-29 MED ORDER — DIPHENHYDRAMINE HCL 25 MG PO CAPS: 25.0000 mg | ORAL_CAPSULE | Freq: Four times a day (QID) | ORAL | Status: DC | PRN

## 2014-09-29 MED ORDER — METOCLOPRAMIDE HCL 10 MG PO TABS: 5.0000 mg | ORAL_TABLET | Freq: Three times a day (TID) | ORAL | Status: DC | PRN

## 2014-09-29 MED ORDER — HYDROMORPHONE HCL 1 MG/ML IJ SOLN: 0.2500 mg | INTRAMUSCULAR | Status: DC | PRN

## 2014-09-29 MED ORDER — HYDROMORPHONE HCL 1 MG/ML IJ SOLN
0.5000 mg | INTRAMUSCULAR | Status: DC | PRN
  Administered 2014-09-29: 0.5 mg via INTRAVENOUS
  Administered 2014-09-29 – 2014-09-30 (×2): 1 mg via INTRAVENOUS
  Filled 2014-09-29 (×3): qty 1

## 2014-09-29 MED ORDER — MAGNESIUM CITRATE PO SOLN
1.0000 | Freq: Once | ORAL | Status: AC | PRN
Start: 2014-09-29 — End: 2014-09-29

## 2014-09-29 MED ORDER — DEXAMETHASONE SODIUM PHOSPHATE 10 MG/ML IJ SOLN
INTRAMUSCULAR | Status: AC
  Filled 2014-09-29: qty 1

## 2014-09-29 MED ORDER — CEFAZOLIN SODIUM-DEXTROSE 2-3 GM-% IV SOLR
INTRAVENOUS | Status: AC
  Filled 2014-09-29: qty 50

## 2014-09-29 MED ORDER — SODIUM CHLORIDE 0.9 % IJ SOLN
INTRAMUSCULAR | Status: DC | PRN
  Administered 2014-09-29: 29 mL

## 2014-09-29 MED ORDER — ASPIRIN EC 325 MG PO TBEC
325.0000 mg | DELAYED_RELEASE_TABLET | Freq: Two times a day (BID) | ORAL | Status: DC
  Administered 2014-09-30: 325 mg via ORAL
  Filled 2014-09-29 (×4): qty 1

## 2014-09-29 MED ORDER — METHOCARBAMOL 500 MG PO TABS
500.0000 mg | ORAL_TABLET | Freq: Four times a day (QID) | ORAL | Status: DC | PRN
  Administered 2014-09-30 (×2): 500 mg via ORAL
  Filled 2014-09-29 (×2): qty 1

## 2014-09-29 MED ORDER — FLUTICASONE PROPIONATE 50 MCG/ACT NA SUSP
2.0000 | Freq: Every day | NASAL | Status: DC | PRN
  Filled 2014-09-29: qty 16

## 2014-09-29 MED ORDER — BISACODYL 10 MG RE SUPP: 10.0000 mg | Freq: Every day | RECTAL | Status: DC | PRN

## 2014-09-29 MED ORDER — MIDAZOLAM HCL 2 MG/2ML IJ SOLN
INTRAMUSCULAR | Status: AC
  Filled 2014-09-29: qty 2

## 2014-09-29 MED ORDER — SODIUM CHLORIDE 0.9 % IJ SOLN
INTRAMUSCULAR | Status: AC
  Filled 2014-09-29: qty 50

## 2014-09-29 MED ORDER — HYDROCODONE-ACETAMINOPHEN 7.5-325 MG PO TABS
1.0000 | ORAL_TABLET | ORAL | Status: DC
  Administered 2014-09-29 (×2): 1 via ORAL
  Administered 2014-09-30 (×4): 2 via ORAL
  Filled 2014-09-29 (×5): qty 2

## 2014-09-29 MED ORDER — ONDANSETRON HCL 4 MG/2ML IJ SOLN
INTRAMUSCULAR | Status: AC
  Filled 2014-09-29: qty 2

## 2014-09-29 MED ORDER — SODIUM CHLORIDE 0.9 % IV SOLN
INTRAVENOUS | Status: DC
  Filled 2014-09-29 (×9): qty 1000

## 2014-09-29 MED ORDER — KETOROLAC TROMETHAMINE 30 MG/ML IJ SOLN
INTRAMUSCULAR | Status: DC | PRN
  Administered 2014-09-29: 30 mg via INTRAMUSCULAR

## 2014-09-29 MED ORDER — MIDAZOLAM HCL 5 MG/5ML IJ SOLN
INTRAMUSCULAR | Status: DC | PRN
  Administered 2014-09-29 (×2): 1 mg via INTRAVENOUS

## 2014-09-29 MED ORDER — DOCUSATE SODIUM 100 MG PO CAPS
100.0000 mg | ORAL_CAPSULE | Freq: Two times a day (BID) | ORAL | Status: DC
  Administered 2014-09-29 – 2014-09-30 (×2): 100 mg via ORAL

## 2014-09-29 MED ORDER — SODIUM CHLORIDE 0.9 % IR SOLN
Status: DC | PRN
  Administered 2014-09-29: 1000 mL

## 2014-09-29 MED ORDER — BUPIVACAINE-EPINEPHRINE (PF) 0.25% -1:200000 IJ SOLN
INTRAMUSCULAR | Status: AC
  Filled 2014-09-29: qty 30

## 2014-09-29 MED ORDER — CEFAZOLIN SODIUM-DEXTROSE 2-3 GM-% IV SOLR
2.0000 g | INTRAVENOUS | Status: AC
  Administered 2014-09-29: 2 g via INTRAVENOUS

## 2014-09-29 MED ORDER — POLYETHYLENE GLYCOL 3350 17 G PO PACK
17.0000 g | PACK | Freq: Two times a day (BID) | ORAL | Status: DC
  Administered 2014-09-29 – 2014-09-30 (×2): 17 g via ORAL

## 2014-09-29 MED ORDER — ONDANSETRON HCL 4 MG/2ML IJ SOLN
4.0000 mg | Freq: Four times a day (QID) | INTRAMUSCULAR | Status: DC | PRN
  Administered 2014-09-29: 4 mg via INTRAVENOUS
  Filled 2014-09-29: qty 2

## 2014-09-29 MED ORDER — GABAPENTIN 100 MG PO CAPS
100.0000 mg | ORAL_CAPSULE | Freq: Three times a day (TID) | ORAL | Status: DC | PRN
  Filled 2014-09-29: qty 1

## 2014-09-29 MED ORDER — LACTATED RINGERS IV SOLN
INTRAVENOUS | Status: DC
  Administered 2014-09-29: 14:00:00 via INTRAVENOUS
  Administered 2014-09-29: 1000 mL via INTRAVENOUS

## 2014-09-29 MED ORDER — CHLORHEXIDINE GLUCONATE 4 % EX LIQD: 60.0000 mL | Freq: Once | CUTANEOUS | Status: DC

## 2014-09-29 MED ORDER — PROPOFOL INFUSION 10 MG/ML OPTIME
INTRAVENOUS | Status: DC | PRN
  Administered 2014-09-29: 120 ug/kg/min via INTRAVENOUS

## 2014-09-29 MED ORDER — OXYCODONE HCL 5 MG/5ML PO SOLN: 5.0000 mg | Freq: Once | ORAL | Status: DC | PRN

## 2014-09-29 MED ORDER — ALUM & MAG HYDROXIDE-SIMETH 200-200-20 MG/5ML PO SUSP: 30.0000 mL | ORAL | Status: DC | PRN

## 2014-09-29 MED ORDER — KETOROLAC TROMETHAMINE 30 MG/ML IJ SOLN
INTRAMUSCULAR | Status: AC
  Filled 2014-09-29: qty 1

## 2014-09-29 MED ORDER — DEXAMETHASONE SODIUM PHOSPHATE 10 MG/ML IJ SOLN
10.0000 mg | Freq: Once | INTRAMUSCULAR | Status: AC
  Administered 2014-09-29: 10 mg via INTRAVENOUS

## 2014-09-29 MED ORDER — BUPIVACAINE IN DEXTROSE 0.75-8.25 % IT SOLN
INTRATHECAL | Status: DC | PRN
  Administered 2014-09-29: 5 mg via INTRATHECAL

## 2014-09-29 MED ORDER — OXYCODONE HCL 5 MG PO TABS: 5.0000 mg | ORAL_TABLET | Freq: Once | ORAL | Status: DC | PRN

## 2014-09-29 MED ORDER — FERROUS SULFATE 325 (65 FE) MG PO TABS
325.0000 mg | ORAL_TABLET | Freq: Three times a day (TID) | ORAL | Status: DC
  Administered 2014-09-30: 325 mg via ORAL
  Filled 2014-09-29 (×5): qty 1

## 2014-09-29 MED ORDER — MENTHOL 3 MG MT LOZG: 1.0000 | LOZENGE | OROMUCOSAL | Status: DC | PRN

## 2014-09-29 MED ORDER — ONDANSETRON HCL 4 MG/2ML IJ SOLN
INTRAMUSCULAR | Status: DC | PRN
  Administered 2014-09-29: 4 mg via INTRAVENOUS

## 2014-09-29 MED ORDER — BUPIVACAINE-EPINEPHRINE (PF) 0.25% -1:200000 IJ SOLN
INTRAMUSCULAR | Status: DC | PRN
  Administered 2014-09-29: 30 mL

## 2014-09-29 MED ORDER — STERILE WATER FOR IRRIGATION IR SOLN
Status: DC | PRN
  Administered 2014-09-29: 1500 mL

## 2014-09-29 MED ORDER — METOCLOPRAMIDE HCL 5 MG/ML IJ SOLN: 5.0000 mg | Freq: Three times a day (TID) | INTRAMUSCULAR | Status: DC | PRN

## 2014-09-29 MED ORDER — CELECOXIB 200 MG PO CAPS
200.0000 mg | ORAL_CAPSULE | Freq: Two times a day (BID) | ORAL | Status: DC
  Administered 2014-09-29 – 2014-09-30 (×2): 200 mg via ORAL
  Filled 2014-09-29 (×4): qty 1

## 2014-09-29 MED ORDER — PHENOL 1.4 % MT LIQD
1.0000 | OROMUCOSAL | Status: DC | PRN
Start: 2014-09-29 — End: 2014-09-30
  Filled 2014-09-29: qty 177

## 2014-09-29 MED ORDER — CEFAZOLIN SODIUM-DEXTROSE 2-3 GM-% IV SOLR
2.0000 g | Freq: Four times a day (QID) | INTRAVENOUS | Status: AC
  Administered 2014-09-29 – 2014-09-30 (×2): 2 g via INTRAVENOUS
  Filled 2014-09-29 (×2): qty 50

## 2014-09-29 MED ORDER — ONDANSETRON HCL 4 MG PO TABS: 4.0000 mg | ORAL_TABLET | Freq: Four times a day (QID) | ORAL | Status: DC | PRN

## 2014-09-29 MED ORDER — TRANEXAMIC ACID 100 MG/ML IV SOLN
1000.0000 mg | Freq: Once | INTRAVENOUS | Status: AC
  Administered 2014-09-29: 1000 mg via INTRAVENOUS
  Filled 2014-09-29: qty 10

## 2014-09-29 MED ORDER — FENTANYL CITRATE 0.05 MG/ML IJ SOLN
INTRAMUSCULAR | Status: DC | PRN
  Administered 2014-09-29 (×2): 50 ug via INTRAVENOUS

## 2014-09-29 MED ORDER — PROMETHAZINE HCL 25 MG/ML IJ SOLN: 6.2500 mg | INTRAMUSCULAR | Status: DC | PRN

## 2014-09-29 MED ORDER — PROPOFOL 10 MG/ML IV BOLUS
INTRAVENOUS | Status: AC
  Filled 2014-09-29: qty 20

## 2014-09-29 MED ORDER — FENTANYL CITRATE 0.05 MG/ML IJ SOLN
INTRAMUSCULAR | Status: AC
  Filled 2014-09-29: qty 2

## 2014-09-29 MED ORDER — 0.9 % SODIUM CHLORIDE (POUR BTL) OPTIME
TOPICAL | Status: DC | PRN
  Administered 2014-09-29: 1000 mL

## 2014-09-29 MED ORDER — PANTOPRAZOLE SODIUM 40 MG PO TBEC: 40.0000 mg | DELAYED_RELEASE_TABLET | Freq: Every day | ORAL | Status: DC | PRN

## 2014-09-29 SURGICAL SUPPLY — 56 items
BAG DECANTER FOR FLEXI CONT (MISCELLANEOUS) IMPLANT
BAG SPEC THK2 15X12 ZIP CLS (MISCELLANEOUS)
BAG ZIPLOCK 12X15 (MISCELLANEOUS) IMPLANT
BANDAGE ELASTIC 6 VELCRO ST LF (GAUZE/BANDAGES/DRESSINGS) ×2 IMPLANT
BANDAGE ESMARK 6X9 LF (GAUZE/BANDAGES/DRESSINGS) ×1 IMPLANT
BLADE SAW SGTL 13.0X1.19X90.0M (BLADE) ×2 IMPLANT
BNDG CMPR 9X6 STRL LF SNTH (GAUZE/BANDAGES/DRESSINGS) ×1
BNDG ESMARK 6X9 LF (GAUZE/BANDAGES/DRESSINGS) ×2
BOWL SMART MIX CTS (DISPOSABLE) ×2 IMPLANT
CAPT KNEE TOTAL 3 ATTUNE ×1 IMPLANT
CEMENT HV SMART SET (Cement) ×2 IMPLANT
CUFF TOURN SGL QUICK 34 (TOURNIQUET CUFF) ×2
CUFF TRNQT CYL 34X4X40X1 (TOURNIQUET CUFF) ×1 IMPLANT
DECANTER SPIKE VIAL GLASS SM (MISCELLANEOUS) ×2 IMPLANT
DRAPE EXTREMITY T 121X128X90 (DRAPE) ×2 IMPLANT
DRAPE POUCH INSTRU U-SHP 10X18 (DRAPES) ×2 IMPLANT
DRAPE U-SHAPE 47X51 STRL (DRAPES) ×2 IMPLANT
DRSG AQUACEL AG ADV 3.5X10 (GAUZE/BANDAGES/DRESSINGS) ×2 IMPLANT
DURAPREP 26ML APPLICATOR (WOUND CARE) ×4 IMPLANT
ELECT REM PT RETURN 9FT ADLT (ELECTROSURGICAL) ×2
ELECTRODE REM PT RTRN 9FT ADLT (ELECTROSURGICAL) ×1 IMPLANT
FACESHIELD WRAPAROUND (MASK) ×10 IMPLANT
FACESHIELD WRAPAROUND OR TEAM (MASK) ×5 IMPLANT
GLOVE BIOGEL PI IND STRL 7.5 (GLOVE) ×1 IMPLANT
GLOVE BIOGEL PI IND STRL 8.5 (GLOVE) ×1 IMPLANT
GLOVE BIOGEL PI INDICATOR 7.5 (GLOVE) ×1
GLOVE BIOGEL PI INDICATOR 8.5 (GLOVE) ×1
GLOVE ECLIPSE 8.0 STRL XLNG CF (GLOVE) ×2 IMPLANT
GLOVE ORTHO TXT STRL SZ7.5 (GLOVE) ×4 IMPLANT
GOWN SPEC L3 XXLG W/TWL (GOWN DISPOSABLE) ×2 IMPLANT
GOWN STRL REUS W/TWL LRG LVL3 (GOWN DISPOSABLE) ×2 IMPLANT
HANDPIECE INTERPULSE COAX TIP (DISPOSABLE) ×2
KIT BASIN OR (CUSTOM PROCEDURE TRAY) ×2 IMPLANT
LIQUID BAND (GAUZE/BANDAGES/DRESSINGS) ×2 IMPLANT
MANIFOLD NEPTUNE II (INSTRUMENTS) ×2 IMPLANT
NDL SAFETY ECLIPSE 18X1.5 (NEEDLE) ×1 IMPLANT
NEEDLE HYPO 18GX1.5 SHARP (NEEDLE) ×2
PACK TOTAL JOINT (CUSTOM PROCEDURE TRAY) ×2 IMPLANT
PEN SKIN MARKING BROAD (MISCELLANEOUS) ×2 IMPLANT
POSITIONER SURGICAL ARM (MISCELLANEOUS) ×2 IMPLANT
SET HNDPC FAN SPRY TIP SCT (DISPOSABLE) ×1 IMPLANT
SET PAD KNEE POSITIONER (MISCELLANEOUS) ×2 IMPLANT
SUCTION FRAZIER 12FR DISP (SUCTIONS) ×2 IMPLANT
SUT MNCRL AB 4-0 PS2 18 (SUTURE) ×2 IMPLANT
SUT VIC AB 1 CT1 36 (SUTURE) ×2 IMPLANT
SUT VIC AB 2-0 CT1 27 (SUTURE) ×6
SUT VIC AB 2-0 CT1 TAPERPNT 27 (SUTURE) ×3 IMPLANT
SUT VLOC 180 0 24IN GS25 (SUTURE) ×2 IMPLANT
SYR 50ML LL SCALE MARK (SYRINGE) ×2 IMPLANT
TOWEL OR 17X26 10 PK STRL BLUE (TOWEL DISPOSABLE) ×2 IMPLANT
TOWEL OR NON WOVEN STRL DISP B (DISPOSABLE) IMPLANT
TRAY FOLEY CATH 14FRSI W/METER (CATHETERS) ×1 IMPLANT
TRAY FOLEY CATH 16FR SILVER (SET/KITS/TRAYS/PACK) ×1 IMPLANT
WATER STERILE IRR 1500ML POUR (IV SOLUTION) ×2 IMPLANT
WRAP KNEE MAXI GEL POST OP (GAUZE/BANDAGES/DRESSINGS) ×2 IMPLANT
YANKAUER SUCT BULB TIP 10FT TU (MISCELLANEOUS) ×2 IMPLANT

## 2014-09-29 NOTE — Transfer of Care (Signed)
Immediate Anesthesia Transfer of Care Note  Patient: Jim Hunter  Procedure(s) Performed: Procedure(s): RIGHT TOTAL KNEE ARTHROPLASTY (Right)  Patient Location: PACU  Anesthesia Type:Spinal  Level of Consciousness: awake, alert  and oriented  Airway & Oxygen Therapy: Patient Spontanous Breathing and Patient connected to face mask oxygen  Post-op Assessment: Report given to RN and Post -op Vital signs reviewed and stable  Post vital signs: Reviewed and stable  Last Vitals:  Filed Vitals:   09/29/14 0923  BP: 154/107  Pulse: 72  Temp: 36.8 C  Resp: 18    Complications: No apparent anesthesia complications

## 2014-09-29 NOTE — Anesthesia Procedure Notes (Signed)
Spinal Patient location during procedure: OR Start time: 09/29/2014 1:01 PM End time: 09/29/2014 1:08 PM Staffing Anesthesiologist: Duane Boston Performed by: anesthesiologist  Preanesthetic Checklist Completed: patient identified, surgical consent, pre-op evaluation, timeout performed, IV checked, risks and benefits discussed and monitors and equipment checked Spinal Block Patient position: sitting Prep: Betadine Patient monitoring: cardiac monitor, continuous pulse ox and blood pressure Approach: midline Injection technique: single-shot Needle Needle type: Pencan  Needle gauge: 24 G Needle length: 9 cm Additional Notes Functioning IV was confirmed and monitors were applied. Sterile prep and drape, including hand hygiene and sterile gloves were used. The patient was positioned and the spine was prepped. The skin was anesthetized with lidocaine.  Free flow of clear CSF was obtained prior to injecting local anesthetic into the CSF.  The spinal needle aspirated freely following injection.  The needle was carefully withdrawn.  The patient tolerated the procedure well.

## 2014-09-29 NOTE — Interval H&P Note (Signed)
History and Physical Interval Note:  09/29/2014 11:49 AM  Jim Hunter  has presented today for surgery, with the diagnosis of OA RIGHT KNEE   The various methods of treatment have been discussed with the patient and family. After consideration of risks, benefits and other options for treatment, the patient has consented to  Procedure(s): RIGHT TOTAL KNEE ARTHROPLASTY (Right) as a surgical intervention .  The patient's history has been reviewed, patient examined, no change in status, stable for surgery.  I have reviewed the patient's chart and labs.  Questions were answered to the patient's satisfaction.     Mauri Pole

## 2014-09-29 NOTE — Plan of Care (Signed)
Problem: Consults Goal: Diagnosis- Total Joint Replacement Primary Total Knee     

## 2014-09-29 NOTE — Op Note (Signed)
NAME:  Jim Hunter                      MEDICAL RECORD NO.:  [id number]                             FACILITY:  [facility]      PHYSICIAN:  Pietro Cassis. Alvan Dame, M.D.  DATE OF BIRTH:  Mar 21, 1966      DATE OF PROCEDURE:  09/29/2014                                     OPERATIVE REPORT         PREOPERATIVE DIAGNOSIS:  Right knee osteoarthritis.      POSTOPERATIVE DIAGNOSIS:  Right knee osteoarthritis.      FINDINGS:  The patient was noted to have complete loss of cartilage and   bone-on-bone arthritis with associated osteophytes in all three compartments of   the knee with a significant synovitis and associated effusion.      PROCEDURE:  Right total knee replacement.      COMPONENTS USED:  DePuy Attune rotating platform posterior stabilized knee   system, a size 6 femur, 6 tibia, 10 mm PS AOX insert, and 41 patellar   button.      SURGEON:  Pietro Cassis. Alvan Dame, M.D.      ASSISTANT:  Danae Orleans, PA-C.      ANESTHESIA:  Spinal.      SPECIMENS:  None.      COMPLICATION:  None.      DRAINS:  None.  EBL: <50cc      TOURNIQUET TIME:   Total Tourniquet Time Documented: Thigh (Right) - 34 minutes Total: Thigh (Right) - 34 minutes  .      The patient was stable to the recovery room.      INDICATION FOR PROCEDURE:  Jim Hunter is a 49 y.o. male patient of   mine.  The patient had been seen, evaluated, and treated conservatively in the   office with medication, activity modification, and injections.  The patient had   radiographic changes of bone-on-bone arthritis with endplate sclerosis and osteophytes noted.      The patient failed conservative measures including medication, injections, and activity modification, and at this point was ready for more definitive measures.   Based on the radiographic changes and failed conservative measures, the patient   decided to proceed with total knee replacement.  Risks of infection,   DVT, component failure, need for revision surgery, postop  course, and   expectations were all   discussed and reviewed.  Consent was obtained for benefit of pain   relief.      PROCEDURE IN DETAIL:  The patient was brought to the operative theater.   Once adequate anesthesia, preoperative antibiotics, 2 gm of Ancef administered, the patient was positioned supine with the right thigh tourniquet placed.  The  right lower extremity was prepped and draped in sterile fashion.  A time-   out was performed identifying the patient, planned procedure, and   extremity.      The right lower extremity was placed in the [facility] leg holder.  The leg was   exsanguinated, tourniquet elevated to 250 mmHg.  A midline incision was   made followed by median parapatellar arthrotomy.  Following initial   exposure, attention was first directed to  the patella.  Precut   measurement was noted to be 28 mm.  I resected down to 16 mm and used a   41 patellar button to restore patellar height as well as cover the cut   surface.      The lug holes were drilled and a metal shim was placed to protect the   patella from retractors and saw blades.      At this point, attention was now directed to the femur.  The femoral   canal was opened with a drill, irrigated to try to prevent fat emboli.  An   intramedullary rod was passed at 3 degrees valgus, 9 mm of bone was   resected off the distal femur.  Following this resection, the tibia was   subluxated anteriorly.  Using the extramedullary guide, 2-4 mm of bone was resected off   the proximal medial tibia.  We confirmed the gap would be   stable medially and laterally with a 6 mm insert as well as confirmed   the cut was perpendicular in the coronal plane, checking with an alignment rod.      Once this was done, I sized the femur to be a size 6 in the anterior-   posterior dimension, chose a standard component based on medial and   lateral dimension.  The size 6 rotation block was then pinned in   position anterior referenced  using the C-clamp to set rotation.  The   anterior, posterior, and  chamfer cuts were made without difficulty nor   notching making certain that I was along the anterior cortex to help   with flexion gap stability.      The final box cut was made off the lateral aspect of distal femur.      At this point, the tibia was sized to be a size 6, the size 6 tray was   then pinned in position through the medial third of the tubercle,   drilled, and keel punched.  Trial reduction was now carried with a 6 femur,  6 tibia, up to the 10 mm PS insert, and the 41 patella botton.  The knee was brought to   extension, full extension with good flexion stability with the patella   tracking through the trochlea without application of pressure.  Given   all these findings, the trial components removed.  Final components were   opened and cement was mixed.  The knee was irrigated with normal saline   solution and pulse lavage.  The synovial lining was   then injected with 30cc of 0.25% Marcaine with epinephrine and 1 cc of Toradol plus 30cc of NS for a  total of 61 cc.      The knee was irrigated.  Final implants were then cemented onto clean and   dried cut surfaces of bone with the knee brought to extension with a 10 mm trial insert.      Once the cement had fully cured, the excess cement was removed   throughout the knee.  I confirmed I was satisfied with the range of   motion and stability, and the final 10 mm PS insert was chosen.  It was   placed into the knee.      The tourniquet had been let down at 35 minutes.  No significant   hemostasis required.  The   extensor mechanism was then reapproximated using #1 Vicryl with the knee   in flexion.  The  remaining wound was closed with 2-0 Vicryl and running 4-0 Monocryl.   The knee was cleaned, dried, dressed sterilely using Dermabond and   Aquacel dressing.  The patient was then   brought to recovery room in stable condition, tolerating the  procedure   well.   Please note that Physician Assistant, Danae Orleans, was present for the entirety of the case, and was utilized for pre-operative positioning, peri-operative retractor management, general facilitation of the procedure.  He was also utilized for primary wound closure at the end of the case.              Pietro Cassis Alvan Dame, M.D.    09/29/2014 2:39 PM

## 2014-09-29 NOTE — Anesthesia Preprocedure Evaluation (Addendum)
Anesthesia Evaluation  Patient identified by MRN, date of birth, ID band Patient awake    Reviewed: Allergy & Precautions, NPO status , Patient's Chart, lab work & pertinent test results  History of Anesthesia Complications Negative for: history of anesthetic complications  Airway Mallampati: II  TM Distance: >3 FB Neck ROM: Full    Dental  (+) Teeth Intact, Dental Advisory Given   Pulmonary sleep apnea , former smoker,    Pulmonary exam normal       Cardiovascular hypertension, Pt. on medications     Neuro/Psych negative neurological ROS  negative psych ROS   GI/Hepatic Neg liver ROS, GERD-  ,  Endo/Other  negative endocrine ROS  Renal/GU      Musculoskeletal   Abdominal   Peds  Hematology   Anesthesia Other Findings   Reproductive/Obstetrics                            Anesthesia Physical Anesthesia Plan  ASA: II  Anesthesia Plan: MAC and Spinal   Post-op Pain Management:    Induction:   Airway Management Planned: Simple Face Mask  Additional Equipment:   Intra-op Plan:   Post-operative Plan:   Informed Consent: I have reviewed the patients History and Physical, chart, labs and discussed the procedure including the risks, benefits and alternatives for the proposed anesthesia with the patient or authorized representative who has indicated his/her understanding and acceptance.   Dental advisory given  Plan Discussed with: CRNA, Anesthesiologist and Surgeon  Anesthesia Plan Comments:        Anesthesia Quick Evaluation

## 2014-09-29 NOTE — Anesthesia Postprocedure Evaluation (Signed)
Anesthesia Post Note  Patient: Jim Hunter  Procedure(s) Performed: Procedure(s) (LRB): RIGHT TOTAL KNEE ARTHROPLASTY (Right)  Anesthesia type: MAC/SAB  Patient location: PACU  Post pain: Pain level controlled  Post assessment: Patient's Cardiovascular Status Stable  Last Vitals:  Filed Vitals:   09/29/14 1620  BP:   Pulse: 64  Temp:   Resp: 16    Post vital signs: Reviewed and stable  Level of consciousness: sedated  Complications: No apparent anesthesia complications

## 2014-09-30 ENCOUNTER — Encounter (HOSPITAL_COMMUNITY): Payer: Self-pay | Admitting: Orthopedic Surgery

## 2014-09-30 DIAGNOSIS — E669 Obesity, unspecified: Secondary | ICD-10-CM | POA: Diagnosis present

## 2014-09-30 LAB — BASIC METABOLIC PANEL
ANION GAP: 11 (ref 5–15)
BUN: 15 mg/dL (ref 6–23)
CALCIUM: 8.9 mg/dL (ref 8.4–10.5)
CO2: 24 mmol/L (ref 19–32)
Chloride: 103 mmol/L (ref 96–112)
Creatinine, Ser: 1.03 mg/dL (ref 0.50–1.35)
GFR calc Af Amer: >90 mL/min (ref >90)
GFR, EST NON AFRICAN AMERICAN: 84 mL/min — AB (ref >90)
Glucose, Bld: 155 mg/dL — ABNORMAL HIGH (ref 70–99)
POTASSIUM: 4.3 mmol/L (ref 3.5–5.1)
SODIUM: 138 mmol/L (ref 135–145)

## 2014-09-30 LAB — CBC
HEMATOCRIT: 37.5 % — AB (ref 39.0–52.0)
HEMOGLOBIN: 13.1 g/dL (ref 13.0–17.0)
MCH: 30.4 pg (ref 26.0–34.0)
MCHC: 34.9 g/dL (ref 30.0–36.0)
MCV: 87 fL (ref 78.0–100.0)
Platelets: 237 10*3/uL (ref 150–400)
RBC: 4.31 MIL/uL (ref 4.22–5.81)
RDW: 13.8 % (ref 11.5–15.5)
WBC: 13.4 10*3/uL — ABNORMAL HIGH (ref 4.0–10.5)

## 2014-09-30 MED ORDER — ASPIRIN 325 MG PO TBEC: 325.0000 mg | DELAYED_RELEASE_TABLET | Freq: Two times a day (BID) | ORAL | Status: AC

## 2014-09-30 MED ORDER — METAXALONE 800 MG PO TABS: 400.0000 mg | ORAL_TABLET | Freq: Three times a day (TID) | ORAL | Status: DC | PRN

## 2014-09-30 MED ORDER — FERROUS SULFATE 325 (65 FE) MG PO TABS: 325.0000 mg | ORAL_TABLET | Freq: Three times a day (TID) | ORAL | Status: DC

## 2014-09-30 MED ORDER — POLYETHYLENE GLYCOL 3350 17 G PO PACK: 17.0000 g | PACK | Freq: Two times a day (BID) | ORAL | Status: DC

## 2014-09-30 MED ORDER — DOCUSATE SODIUM 100 MG PO CAPS: 100.0000 mg | ORAL_CAPSULE | Freq: Two times a day (BID) | ORAL | Status: DC

## 2014-09-30 MED ORDER — HYDROCODONE-ACETAMINOPHEN 7.5-325 MG PO TABS: 1.0000 | ORAL_TABLET | ORAL | Status: DC | PRN

## 2014-09-30 NOTE — Evaluation (Signed)
Physical Therapy Evaluation Patient Details Name: Jim Hunter MRN: [id number] DOB: [birth year] Today's Date: 09/30/2014   History of Present Illness  RTKA  Clinical Impression  Patient tolerated very well,  Plans Dc today. Will see again for Stairs. patient will benefit from PT to address problems listed in note below.    Follow Up Recommendations Home health PT;Supervision/Assistance - 24 hour    Equipment Recommendations  Rolling walker with 5" wheels    Recommendations for Other Services       Precautions / Restrictions Precautions Precautions: Fall;Knee Restrictions Weight Bearing Restrictions: No      Mobility  Bed Mobility Overal bed mobility: Needs Assistance Bed Mobility: Supine to Sit     Supine to sit: Min assist     General bed mobility comments: support R leg  Transfers Overall transfer level: Needs assistance Equipment used: Rolling walker (2 wheeled) Transfers: Sit to/from Stand Sit to Stand: Min guard;From elevated surface         General transfer comment: cues for hand and R leg position  Ambulation/Gait Ambulation/Gait assistance: Min guard Ambulation Distance (Feet): 400 Feet Assistive device: Rolling walker (2 wheeled) Gait Pattern/deviations: Step-through pattern     General Gait Details: cues for sequrnce, safety, slow  speed.  Stairs            Wheelchair Mobility    Modified Rankin (Stroke Patients Only)       Balance                                             Pertinent Vitals/Pain Pain Assessment: 0-10 Pain Score: 5  Pain Location: R knee and thigh Pain Descriptors / Indicators: Aching;Tightness Pain Intervention(s): Monitored during session;Premedicated before session;Ice applied    Home Living Family/patient expects to be discharged to:: Private residence Living Arrangements: Spouse/significant other Available Help at Discharge: Family Type of Home: House Home Access: Stairs to  enter Entrance Stairs-Rails: Left Entrance Stairs-Number of Steps: 6 Home Layout: One level Home Equipment: None      Prior Function Level of Independence: Independent               Hand Dominance        Extremity/Trunk Assessment               Lower Extremity Assessment: LLE deficits/detail   LLE Deficits / Details: able to perform SLR after performing exercises.     Communication   Communication: No difficulties  Cognition Arousal/Alertness: Awake/alert Behavior During Therapy: WFL for tasks assessed/performed Overall Cognitive Status: Within Functional Limits for tasks assessed                      General Comments      Exercises Total Joint Exercises Ankle Circles/Pumps: AROM;Both;10 reps;Supine Quad Sets: AROM;Both;10 reps;Supine Heel Slides: AAROM;Right;10 reps;Supine Hip ABduction/ADduction: AROM;Right;10 reps;Supine Straight Leg Raises: AAROM;15 reps;Supine Long Arc Quad: AROM;Right;Seated      Assessment/Plan    PT Assessment Patient needs continued PT services  PT Diagnosis Difficulty walking;Acute pain   PT Problem List Decreased strength;Decreased range of motion;Decreased activity tolerance;Decreased mobility;Decreased knowledge of precautions;Decreased safety awareness;Decreased knowledge of use of DME;Pain  PT Treatment Interventions DME instruction;Gait training;Stair training;Functional mobility training;Therapeutic activities;Therapeutic exercise;Patient/family education   PT Goals (Current goals can be found in the Care Plan section) Acute Rehab PT Goals Patient Stated Goal: to go  hom PT Goal Formulation: With patient Time For Goal Achievement: 10/01/14 Potential to Achieve Goals: Good    Frequency 7X/week   Barriers to discharge        Co-evaluation               End of Session   Activity Tolerance: Patient tolerated treatment well Patient left: in chair;with call bell/phone within reach Nurse  Communication: Mobility status         Time: 0927-0952 PT Time Calculation (min) (ACUTE ONLY): 25 min   Charges:   PT Evaluation $Initial PT Evaluation Tier I: 1 Procedure PT Treatments $Gait Training: 8-22 mins   PT G Codes:        [name] 09/30/2014, 10:08 AM Tresa Endo PT [phone]

## 2014-09-30 NOTE — Evaluation (Signed)
Occupational Therapy Evaluation Patient Details Name: Jim Hunter MRN: [id number] DOB: June 05, 1966 Today's Date: 09/30/2014    History of Present Illness RTKA   Clinical Impression   This 49 year old man was admitted for the above surgery.  All education was completed.  No further OT is needed at this time.  Pt will have assist as needed for ADLs and he will sponge bathe until he can safely step over tub.     Follow Up Recommendations  No OT follow up    Equipment Recommendations  3 in 1 bedside comode    Recommendations for Other Services       Precautions / Restrictions Precautions Precautions: Fall;Knee Restrictions Weight Bearing Restrictions: No      Mobility Bed Mobility              Transfers Overall transfer level: Needs assistance Equipment used: Rolling walker (2 wheeled) Transfers: Sit to/from Stand Sit to Stand: Min guard         General transfer comment: cues for hand and R leg position    Balance                                            ADL Overall ADL's : Needs assistance/impaired     Grooming: Oral care;Min guard;Standing                   Toilet Transfer: Min guard;Ambulation             General ADL Comments: Pt will wait to complete ADL when his wife is here. He can perform UB with set up and LB with min A. Educated on tub readiness.       Vision     Perception     Praxis      Pertinent Vitals/Pain Pain Assessment: 0-10 Pain Score: 8  Pain Location: R knee Pain Descriptors / Indicators: Aching Pain Intervention(s): Limited activity within patient's tolerance;Monitored during session;Premedicated before session;Repositioned;Ice applied     Hand Dominance     Extremity/Trunk Assessment Upper Extremity Assessment Upper Extremity Assessment: Overall WFL for tasks assessed          Communication Communication Communication: No difficulties   Cognition Arousal/Alertness:  Awake/alert Behavior During Therapy: WFL for tasks assessed/performed Overall Cognitive Status: Within Functional Limits for tasks assessed                     General Comments       Exercises       Shoulder Instructions      Home Living Family/patient expects to be discharged to:: Private residence Living Arrangements: Spouse/significant other Available Help at Discharge: Family Type of Home: House Home Access: Stairs to enter Technical brewer of Steps: 6 Entrance Stairs-Rails: Left Home Layout: One level     Bathroom Shower/Tub: Tub/shower unit Shower/tub characteristics: Architectural technologist: Standard     Home Equipment: None          Prior Functioning/Environment Level of Independence: Independent             OT Diagnosis: Generalized weakness;Acute pain   OT Problem List:     OT Treatment/Interventions:      OT Goals(Current goals can be found in the care plan section) Acute Rehab OT Goals Patient Stated Goal: to go hom  OT Frequency: Min 2X/week   Barriers to D/C:  Co-evaluation              End of Session    Activity Tolerance: Patient tolerated treatment well Patient left: in chair;with call bell/phone within reach   Time: 1122-1141 OT Time Calculation (min): 19 min Charges:  OT General Charges $OT Visit: 1 Procedure OT Evaluation $Initial OT Evaluation Tier I: 1 Procedure G-Codes:    Briyana Badman 10-04-14, 12:32 PM  Lesle Chris, OTR/L [phone] October 04, 2014

## 2014-09-30 NOTE — Care Management Note (Addendum)
    Page 1 of 1   09/30/2014     2:10:06 PM CARE MANAGEMENT NOTE 09/30/2014  Patient:  Jim Hunter, Jim Hunter   Account Number:  [id number]  Date Initiated:  09/30/2014  Documentation initiated by:  [facility]  Subjective/Objective Assessment:   adm: RIGHT TOTAL KNEE ARTHROPLASTY (Right)     Action/Plan:   discharge planning   Anticipated DC Date:  09/30/2014   Anticipated DC Plan:  [place]  CM consult      Choice offered to / List presented to:             Status of service:  Completed, signed off Medicare Important Message given?   (If response is "NO", the following Medicare IM given date fields will be blank) Date Medicare IM given:   Medicare IM given by:   Date Additional Medicare IM given:   Additional Medicare IM given by:    Discharge Disposition:  [place]  Per UR Regulation:    If discussed at Long Length of Stay Meetings, dates discussed:    Comments:  09/30/14 14:00 CM received call from [facility] requesting I fax orders for DME to her so she can arrange with [facility].  CM faxed orders to [facility] at [phone].  CM has received verification of receipt.  No other CM needs were communicatede. Mariane Masters, BSN, [place]ear[place] Lesch [phone]. 09/30/14 10:49 Despite having successful transmission of fax notification, Verdis Frederickson requests I refax to the same number. CM faxed per reqeust.  Mariane Masters, BSN, [place]. 09/30/14 10:00 CM spoke with case worker Verdis Frederickson to please arrange HHPT, rolliing waker and 3n1 for pt.  Per Maria's request, CM faxed facesheet, H&P, OP note, orders, face to face, and post op PT EVAL  to [phone].  CM has requested to have DME (at least rolling walke)r be sent to hospital room prior to discharge.  No other CM needs were communicated. Mariane Masters, BSN, Cm [phone].

## 2014-09-30 NOTE — Progress Notes (Addendum)
Physical Therapy Treatment Patient Details Name: Jim Hunter MRN: [id number] DOB: 07-26-1965 Today's Date: 09/30/2014    History of Present Illness RTKA    PT Comments    Patient is progressing well. Ready for DC  Follow Up Recommendations  Home health PT;Supervision/Assistance - 24 hour     Equipment Recommendations  Rolling walker with 5" wheels    Recommendations for Other Services       Precautions / Restrictions Precautions Precautions: Fall;Knee Restrictions Weight Bearing Restrictions: No    Mobility  Bed Mobility   Bed Mobility: Sit to Supine       Sit to supine: Supervision   General bed mobility comments: cues for self assisting R leg onto bed  Transfers   Equipment used: Rolling walker (2 wheeled);Straight cane Transfers: Sit to/from Stand Sit to Stand: Supervision         General transfer comment: cues for hand and R leg position  Ambulation/Gait Ambulation/Gait assistance: Supervision;Min guard Ambulation Distance (Feet): 500 Feet   Gait Pattern/deviations: Step-through pattern       Stairs Stairs: Yes Stairs assistance: Supervision Stair Management: One rail Left;Forwards;With cane Number of Stairs: 4 General stair comments: cues for sequence and safety  Wheelchair Mobility    Modified Rankin (Stroke Patients Only)       Balance                                    Cognition Arousal/Alertness: Awake/alert Behavior During Therapy: WFL for tasks assessed/performed Overall Cognitive Status: Within Functional Limits for tasks assessed                      Exercises Total Joint Exercises Ankle Circles/Pumps: AROM;Both;10 reps;Supine Quad Sets: AROM;Both;10 reps;Supine Short Arc Quad: AROM;Right;10 reps;Supine Heel Slides: Right;10 reps;Supine;AROM Hip ABduction/ADduction: AROM;Right;10 reps;Supine Straight Leg Raises: 15 reps;Supine;AROM Long Arc Quad: AROM;Right;Seated    General Comments         Pertinent Vitals/Pain Pain Score: 5  Pain Location: R knee Pain Descriptors / Indicators: Cramping;Squeezing Pain Intervention(s): Monitored during session;Premedicated before session;Ice applied    Home Living Family/patient expects to be discharged to:: Private residence Living Arrangements: Spouse/significant other Available Help at Discharge: Family         Home Equipment: None      Prior Function Level of Independence: Independent          PT Goals (current goals can now be found in the care plan section) Acute Rehab PT Goals Patient Stated Goal: to go hom Progress towards PT goals: Progressing toward goals    Frequency  7X/week    PT Plan Current plan remains appropriate    Co-evaluation             End of Session   Activity Tolerance: Patient tolerated treatment well Patient left: in bed;with call bell/phone within reach;with family/visitor present     Time: 1345-1413 PT Time Calculation (min) (ACUTE ONLY): 28 min  Charges:  $Gait Training: 8-22 mins $Therapeutic Exercise: 8-22 mins                    G Codes:      Jim Hunter 09/30/2014, 3:29 PM

## 2014-09-30 NOTE — Progress Notes (Signed)
     Subjective: 1 Day Post-Op Procedure(s) (LRB): RIGHT TOTAL KNEE ARTHROPLASTY (Right)   Patient reports pain as mild, pain controlled. Pain was a little more yesterday, but has got better. No events throughout the night. Looking forward to working with PT. Ready to be discharged home.  Objective:   VITALS:   Filed Vitals:   09/30/14 0644  BP: 157/91  Pulse: 78  Temp: 98.6 F (37 C)  Resp: 16    Dorsiflexion/Plantar flexion intact Incision: dressing C/D/I No cellulitis present Compartment soft  LABS  Recent Labs  09/30/14 0435  HGB 13.1  HCT 37.5*  WBC 13.4*  PLT 237     Recent Labs  09/30/14 0435  NA 138  K 4.3  BUN 15  CREATININE 1.03  GLUCOSE 155*     Assessment/Plan: 1 Day Post-Op Procedure(s) (LRB): RIGHT TOTAL KNEE ARTHROPLASTY (Right) Foley cath d/c'ed Advance diet Up with therapy D/C IV fluids Discharge home with home health  [facility]. Follow up with OLIN,Curren Mohrmann D in 2 weeks.  Contact information:  [facility] [address], [place] [phone]    Obese (BMI 30-39.9) Estimated body mass index is 33.8 kg/(m^2) as calculated from the following:   Height as of this encounter: 5\' 9"  (1.753 m).   Weight as of this encounter: 103.874 kg (229 lb). Patient also counseled that weight may inhibit the healing process Patient counseled that losing weight will help with future health issues        [place] Pugh. Shabrea Weldin   PAC  09/30/2014, 10:11 AM

## 2014-10-05 NOTE — Discharge Summary (Signed)
Physician Discharge Summary  Patient ID: Adolfo Granieri MRN: [id number] DOB/AGE: April 06, 1966 49 y.o.  Admit date: 09/29/2014 Discharge date: 09/30/2014   Procedures:  Procedure(s) (LRB): RIGHT TOTAL KNEE ARTHROPLASTY (Right)  Attending Physician:  Dr. Paralee Cancel   Admission Diagnoses:   Right knee primary OA / pain  Discharge Diagnoses:  Principal Problem:   S/P right TKA Active Problems:   S/P knee replacement   Obese  Past Medical History  Diagnosis Date  . Hypertension   . Hyperlipidemia   . Arthritis   . Sleep apnea 3 years ago    no borderline no cpap needed per sleep studty done  . GERD (gastroesophageal reflux disease)   . PPD screening test last 9 years ago first started as child    treated as child for positive ppd test    HPI:    Arlynn Mcdermid, 49 y.o. male, has a history of pain and functional disability in the right knee due to trauma and arthritis and has failed non-surgical conservative treatments for greater than 12 weeks to include NSAID's and/or analgesics, corticosteriod injections, viscosupplementation injections and activity modification. Onset of symptoms was abrupt, starting ~4 years ago after he got it caught in a palette and twisting with gradually worsening course since that time. The patient noted no past surgery on the right knee(s). Patient currently rates pain in the right knee(s) at 10 out of 10 with activity. Patient has night pain, worsening of pain with activity and weight bearing, pain that interferes with activities of daily living, pain with passive range of motion, crepitus and joint swelling. Patient has evidence of periarticular osteophytes and joint space narrowing by imaging studies. There is no active infection. Risks, benefits and expectations were discussed with the patient. Risks including but not limited to the risk of anesthesia, blood clots, nerve damage, blood vessel damage, failure of the prosthesis, infection and up to and  including death. Patient understand the risks, benefits and expectations and wishes to proceed with surgery.   PCP: Tawanna Sat, MD   Discharged Condition: good  Hospital Course:  Patient underwent the above stated procedure on 09/29/2014. Patient tolerated the procedure well and brought to the recovery room in good condition and subsequently to the floor.  POD #1 BP: 157/91 ; Pulse: 78 ; Temp: 98.6 F (37 C) ; Resp: 16 Patient reports pain as mild, pain controlled. Pain was a little more yesterday, but has got better. No events throughout the night. Looking forward to working with PT. Ready to be discharged home. Dorsiflexion/plantar flexion intact, incision: dressing C/D/I, no cellulitis present and compartment soft.   LABS  Basename    HGB  13.1  HCT  37.5    Discharge Exam: General appearance: alert, cooperative and no distress Extremities: Homans sign is negative, no sign of DVT, no edema, redness or tenderness in the calves or thighs and no ulcers, gangrene or trophic changes  Disposition: Home with follow up in 2 weeks   Follow-up Information    Follow up with Mauri Pole, MD. Schedule an appointment as soon as possible for a visit in 2 weeks.   Specialty:  Orthopedic Surgery   Contact information:   [address] [place] [address] [phone]       Discharge Instructions    Call MD / Call 911    Complete by:  As directed   If you experience chest pain or shortness of breath, CALL 911 and be transported to the hospital emergency  room.  If you develope a fever above 101 F, pus (white drainage) or increased drainage or redness at the wound, or calf pain, call your surgeon's office.     Change dressing    Complete by:  As directed   Maintain surgical dressing until follow up in the clinic. If the edges start to pull up, may reinforce with tape. If the dressing is no longer working, may remove and cover with gauze and tape, but must keep the  area dry and clean.  Call with any questions or concerns.     Constipation Prevention    Complete by:  As directed   Drink plenty of fluids.  Prune juice may be helpful.  You may use a stool softener, such as Colace (over the counter) 100 mg twice a day.  Use MiraLax (over the counter) for constipation as needed.     Diet - low sodium heart healthy    Complete by:  As directed      Discharge instructions    Complete by:  As directed   Maintain surgical dressing until follow up in the clinic. If the edges start to pull up, may reinforce with tape. If the dressing is no longer working, may remove and cover with gauze and tape, but must keep the area dry and clean.  Follow up in 2 weeks at [facility]. Call with any questions or concerns.     Driving restrictions    Complete by:  As directed   No driving for 4 weeks     Increase activity slowly as tolerated    Complete by:  As directed      TED hose    Complete by:  As directed   Use stockings (TED hose) for 2 weeks on both leg(s).  You may remove them at night for sleeping.     Weight bearing as tolerated    Complete by:  As directed   Laterality:  right  Extremity:  Lower             Medication List    STOP taking these medications        etodolac 400 MG tablet  Commonly known as:  LODINE     ezetimibe 10 MG tablet  Commonly known as:  ZETIA     ibuprofen 200 MG tablet  Commonly known as:  ADVIL,MOTRIN     traMADol 50 MG tablet  Commonly known as:  ULTRAM      TAKE these medications        aspirin 325 MG EC tablet  Take 1 tablet (325 mg total) by mouth 2 (two) times daily.     docusate sodium 100 MG capsule  Commonly known as:  COLACE  Take 1 capsule (100 mg total) by mouth 2 (two) times daily.     ferrous sulfate 325 (65 FE) MG tablet  Take 1 tablet (325 mg total) by mouth 3 (three) times daily after meals.     fluticasone 50 MCG/ACT nasal spray  Commonly known as:  FLONASE  Place 2 sprays into  both nostrils daily.     gabapentin 100 MG capsule  Commonly known as:  NEURONTIN  Take 1 capsule (100 mg total) by mouth 3 (three) times daily.     HYDROcodone-acetaminophen 7.5-325 MG per tablet  Commonly known as:  NORCO  Take 1-2 tablets by mouth every 4 (four) hours as needed for moderate pain.     lisinopril 20 MG tablet  Commonly known  as:  PRINIVIL,ZESTRIL  TAKE 1 TABLET BY MOUTH ONCE DAILY     metaxalone 800 MG tablet  Commonly known as:  SKELAXIN  Take 0.5 tablets (400 mg total) by mouth 3 (three) times daily as needed for muscle spasms.     omeprazole 20 MG capsule  Commonly known as:  PRILOSEC  Take 1 capsule (20 mg total) by mouth daily.     polyethylene glycol packet  Commonly known as:  MIRALAX / GLYCOLAX  Take 17 g by mouth 2 (two) times daily.     VIAGRA 100 MG tablet  Generic drug:  sildenafil  TAKE 1 TABLET BY MOUTH DAILY AS NEEDED FOR ERECTILE DYSFUNCTION.         Signed: West Pugh. Diandre Merica   PA-C  10/05/2014, 12:04 PM

## 2014-10-20 ENCOUNTER — Encounter: Payer: Self-pay | Admitting: Physical Therapy

## 2014-10-20 ENCOUNTER — Ambulatory Visit (INDEPENDENT_AMBULATORY_CARE_PROVIDER_SITE_OTHER): Payer: PRIVATE HEALTH INSURANCE | Admitting: Physical Therapy

## 2014-10-20 DIAGNOSIS — M25661 Stiffness of right knee, not elsewhere classified: Secondary | ICD-10-CM | POA: Diagnosis not present

## 2014-10-20 DIAGNOSIS — M25561 Pain in right knee: Secondary | ICD-10-CM

## 2014-10-20 DIAGNOSIS — R269 Unspecified abnormalities of gait and mobility: Secondary | ICD-10-CM

## 2014-10-20 DIAGNOSIS — M6281 Muscle weakness (generalized): Secondary | ICD-10-CM

## 2014-10-20 DIAGNOSIS — M7989 Other specified soft tissue disorders: Secondary | ICD-10-CM | POA: Diagnosis not present

## 2014-10-20 NOTE — Patient Instructions (Signed)
Single leg stance at counter top, goal of 30 sec, and work more on knee extension with propping knee on coffee table.

## 2014-10-20 NOTE — Therapy (Signed)
[facility] [place], [place], [address] Phone: [phone]   Fax:  [phone]  Physical Therapy Evaluation  Patient Details  Name: Jim Hunter MRN: [id number] Date of Birth: 02-Jan-1966 Referring Provider:  Paralee Cancel, MD  Encounter Date: 10/20/2014      PT End of Session - 10/20/14 0917    Visit Number 1   Number of Visits 12   Date for PT Re-Evaluation 11/17/14   PT Start Time 0917   PT Stop Time 1014   PT Time Calculation (min) 57 min   Activity Tolerance Patient limited by pain      Past Medical History  Diagnosis Date  . Hypertension   . Hyperlipidemia   . Arthritis   . Sleep apnea 3 years ago    no borderline no cpap needed per sleep studty done  . GERD (gastroesophageal reflux disease)   . PPD screening test last 9 years ago first started as child    treated as child for positive ppd test    Past Surgical History  Procedure Laterality Date  . Rotator cuff repair  2010    left  . Lumbar disc surgery  2006  . Tympanoplasty      age 59  . Total knee arthroplasty Right 09/29/2014    Procedure: RIGHT TOTAL KNEE ARTHROPLASTY;  Surgeon: Paralee Cancel, MD;  Location: [facility];  Service: Orthopedics;  Laterality: Right;    There were no vitals filed for this visit.  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  Had cortisone shots, Pain returned 2 yrs later worse. Had to start all over with injections, never had PT. was bone on bone and failed conservative tx   Diagnostic tests x-rays   Patient Stated Goals Pt wishes to return to his normal activities, walk and hike. Work   Currently in Pain? Yes   Pain Score 6    Pain Location Knee   Pain Orientation Right   Pain Descriptors / Indicators Spasm;Stabbing;Aching;Sharp   Pain Type Surgical pain   Aggravating Factors  not sure, will have spasms randomly with increased pain, sleeping is difficult, rising from sitting or sleeping    Pain Relieving Factors meds, icing, rest            [facility] PT Assessment - 10/20/14 0001    Assessment   Medical Diagnosis Rt TKR   Onset Date 09/29/14   Next MD Visit w+1   Prior Therapy HHPT finished up last Friday.    Precautions   Precautions None   Balance Screen   Has the patient fallen in the past 6 months No   Has the patient had a decrease in activity level because of a fear of falling?  No   Is the patient reluctant  to leave their home because of a fear of falling?  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#, push/pull on carts   Leisure walk/hike   Observation/Other Assessments   Observations healing incision, decreased mobility   Focus on Therapeutic Outcomes (FOTO)  71% limited   ROM / Strength   AROM / PROM / Strength AROM;PROM;Strength   AROM   AROM Assessment Site Knee   Right/Left Knee Right   Right Knee Extension -18   Right Knee Flexion 95   PROM   PROM Assessment Site Knee   Right/Left Knee Right  flex 98, ext -17   Strength   Strength Assessment Site Knee;Hip  bilat ankles WNL   Right/Left Hip Right;Left   Right Hip Flexion 5/5   Right Hip Extension 4-/5   Right Hip ABduction 4+/5   Left Hip Flexion 5/5   Left Hip Extension 5/5   Left Hip ABduction --  5-/5    Right/Left Knee Right   Right Knee Flexion 5/5   Right Knee Extension --  5-/5, visible atrophy in the Rt quad.    Ambulation/Gait   Ambulation/Gait Yes   Ambulation/Gait Assistance 6: Modified independent (Device/Increase time)   Assistive device Straight cane   Gait Pattern Decreased hip/knee flexion - right;Decreased weight shift to right;Right hip hike;Right circumduction   Balance   Balance Assessed Yes  single leg stance Rt 15 sec                   [facility] Adult PT Treatment/Exercise - 10/20/14 0001    Exercises   Exercises Knee/Hip   Knee/Hip Exercises: Aerobic   Stationary Bike for ROM, able to go back not forward with foot dorsiflexed   Knee/Hip Exercises: Standing   Other Standing Knee Exercises 20 x 5 sec TKE's Rt with ball behind the knee   Modalities   Modalities Cryotherapy;Electrical Stimulation   Cryotherapy   Number Minutes Cryotherapy 15 Minutes   Cryotherapy Location Knee  Rt   Type of Cryotherapy --  aso, high pressure, 3*   Electrical Stimulation   Electrical Stimulation Location Rt knee   Electrical Stimulation Action ion repelling   Electrical Stimulation Parameters to tolerance   Electrical Stimulation Goals Pain;Edema                PT Education - 10/20/14 1009    Education provided Yes   Education Details HEP   Person(s) Educated Patient   Methods Explanation   Comprehension Verbalized understanding          PT Short Term Goals - 10/20/14 1005    PT SHORT TERM GOAL #1   Title I with Knee ROM exercise   Time 2   Period Weeks   Status New   PT SHORT TERM GOAL #2   Title increase Rt knee extension to -8 degrees   Time 2   Period Weeks   Status New   PT SHORT TERM GOAL #3   Title increase Rt knee flexion 115 degrees   Time 2   Period Weeks   Status New   PT SHORT TERM GOAL #4   Title ambulate without assistive device on even surfaces   Time 2   Period Weeks   Status New   PT SHORT TERM GOAL #5   Title reduce  pain =/> 50% with dialy activity   Time 2  Period Weeks   Status New           PT Long Term Goals - 10/20/14 1006    PT LONG TERM GOAL #1   Title I with advanced HEP   Time 4   Period Weeks   Status New   PT LONG TERM GOAL #2   Title ambulate on even/uneven surfaces and stairs with normalized gait   Time 4   Period Weeks   Status New   PT LONG TERM GOAL #3   Title increased strength Rt hip =/> 5/5   Time 4   Period Weeks   Status New   PT LONG TERM GOAL #4   Title demo Rt knee ROM WFL    Time 4   Period Weeks   Status New   PT LONG TERM GOAL #5   Title improve FOTO =/< 47% limited   Time 4   Period Weeks   Status New   PT LONG TERM GOAL #6   Title lift 50# floor to waist level without trouble   Time 4   Period Weeks   Status New               Plan - 10/20/14 1003    Clinical Impression Statement Pt s/p elective Rt TKR, has significant decrease in ROM and Rt quad function along with gait abnormality.     Pt will benefit from skilled therapeutic intervention in order to improve on the following deficits Abnormal gait;Decreased range of motion;Difficulty walking;Impaired flexibility;Decreased skin integrity;Pain;Decreased mobility;Decreased strength;Decreased balance   Rehab Potential Excellent   PT Frequency 3x / week   PT Duration 4 weeks   PT Treatment/Interventions Moist Heat;Therapeutic activities;Patient/family education;Scar mobilization;Passive range of motion;Therapeutic exercise;DME Instruction;Ultrasound;Gait training;Balance training;Manual techniques;Neuromuscular re-education;Stair training;Cryotherapy;Electrical Stimulation   PT Next Visit Plan ROM, strengthening   Consulted and Agree with Plan of Care Patient         Problem List Patient Active Problem List   Diagnosis Date Noted  . Obese 09/30/2014  . S/P right TKA 09/29/2014  . S/P knee replacement 09/29/2014  . Excessive cerumen in ear canal 09/02/2014  . Alcohol use 07/06/2014   . Osteoarthritis of right knee 05/26/2014  . Erectile dysfunction 05/26/2014  . Numbness and tingling in left arm 07/02/2013  . Sensation of fullness in left ear 05/02/2013  . Back pain 05/02/2013  . GERD (gastroesophageal reflux disease) 10/02/2012  . Umbilical hernia 23/53/6144  . Hypertension 07/29/2012  . Hyperlipidemia 07/29/2012  . Obesity (BMI 30-39.9) 07/29/2012    Jeral Pinch PT 10/20/2014, 10:12 AM  [facility] [place], [place], [address] Phone: [phone]   Fax:  [phone]

## 2014-10-21 ENCOUNTER — Encounter: Payer: PRIVATE HEALTH INSURANCE | Admitting: Physical Therapy

## 2014-10-22 ENCOUNTER — Ambulatory Visit (INDEPENDENT_AMBULATORY_CARE_PROVIDER_SITE_OTHER): Payer: PRIVATE HEALTH INSURANCE | Admitting: Physical Therapy

## 2014-10-22 DIAGNOSIS — M25661 Stiffness of right knee, not elsewhere classified: Secondary | ICD-10-CM | POA: Diagnosis not present

## 2014-10-22 DIAGNOSIS — R269 Unspecified abnormalities of gait and mobility: Secondary | ICD-10-CM

## 2014-10-22 DIAGNOSIS — M6281 Muscle weakness (generalized): Secondary | ICD-10-CM

## 2014-10-22 DIAGNOSIS — M25561 Pain in right knee: Secondary | ICD-10-CM | POA: Diagnosis not present

## 2014-10-22 DIAGNOSIS — M7989 Other specified soft tissue disorders: Secondary | ICD-10-CM

## 2014-10-22 DIAGNOSIS — M7981 Nontraumatic hematoma of soft tissue: Secondary | ICD-10-CM

## 2014-10-22 NOTE — Therapy (Signed)
[place], [place], [address] Phone: [phone]   Fax:  [phone]  Physical Therapy Treatment  Patient Details  Name: Jim Hunter MRN: [id number] Date of Birth: 07/11/1966 Referring Provider:  Leone Brand, MD  Encounter Date: 10/22/2014      PT End of Session - 10/22/14 0845    Visit Number 2   Number of Visits 12   Date for PT Re-Evaluation 11/17/14   PT Start Time 0802   PT Stop Time 0900   PT Time Calculation (min) 58 min   Activity Tolerance Patient limited by pain   Behavior During Therapy [facility] for tasks assessed/performed      Past Medical History  Diagnosis Date  . Hypertension   . Hyperlipidemia   . Arthritis   . Sleep apnea 3 years ago    no borderline no cpap needed per sleep studty done  . GERD (gastroesophageal reflux disease)   . PPD screening test last 9 years ago first started as child    treated as child for positive ppd test    Past Surgical History  Procedure Laterality Date  . Rotator cuff repair  2010    left  . Lumbar disc surgery  2006  . Tympanoplasty      age 43  . Total knee arthroplasty Right 09/29/2014    Procedure: RIGHT TOTAL KNEE ARTHROPLASTY;  Surgeon: Paralee Cancel, MD;  Location: [facility];  Service: Orthopedics;  Laterality: Right;    There were no vitals filed for this visit.  Visit Diagnosis:  Pain in knee joint, right  Stiffness of knee joint, right  Abnormality of gait  Swelling of limb  Muscle weakness (generalized)      Subjective Assessment - 10/22/14 0806    Subjective R knee stiff this AM (reports it was a 10/10); currently a 6/10   Patient Stated Goals Pt wishes to return to his normal activities, walk and hike. Work   Currently in Pain? Yes   Pain Score 6    Pain Location Knee   Pain Orientation Right            [facility] PT Assessment - 10/22/14 0822    AROM   Right Knee Flexion 102  104 AA                    [facility] Adult PT Treatment/Exercise - 10/22/14 0807    Knee/Hip Exercises: Aerobic   Stationary Bike for ROM, able to go back not forward with foot dorsiflexed x 8 min   Knee/Hip Exercises: Supine   Quad Sets Strengthening;Right;15 reps   Heel Slides AAROM;Right;15 reps   Heel Slides Limitations with strap   Knee/Hip Exercises: Prone   Other Prone Exercises TKE 5sec x 15   Modalities   Modalities Cryotherapy;Electrical Stimulation   Cryotherapy   Number Minutes Cryotherapy 15 Minutes   Cryotherapy Location Knee   Type of Cryotherapy Other (comment)  vaso mod pressure   Electrical Stimulation   Electrical Stimulation Location Rt knee   Electrical Stimulation Action IFC   Electrical Stimulation Parameters to tolerance x 15 min   Electrical Stimulation Goals Pain;Edema                  PT Short Term Goals - 10/20/14 1005    PT SHORT TERM GOAL #1   Title I with Knee ROM exercise   Time 2   Period Weeks   Status New  PT SHORT TERM GOAL #2   Title increase Rt knee extension to -8 degrees   Time 2   Period Weeks   Status New   PT SHORT TERM GOAL #3   Title increase Rt knee flexion 115 degrees   Time 2   Period Weeks   Status New   PT SHORT TERM GOAL #4   Title ambulate without assistive device on even surfaces   Time 2   Period Weeks   Status New   PT SHORT TERM GOAL #5   Title reduce pain =/> 50% with dialy activity   Time 2   Period Weeks   Status New           PT Long Term Goals - 10/20/14 1006    PT LONG TERM GOAL #1   Title I with advanced HEP   Time 4   Period Weeks   Status New   PT LONG TERM GOAL #2   Title ambulate on even/uneven surfaces and stairs with normalized gait   Time 4   Period Weeks   Status New   PT LONG TERM GOAL #3   Title increased strength Rt hip =/> 5/5   Time 4   Period Weeks   Status New   PT LONG TERM GOAL #4   Title demo Rt knee ROM WFL    Time 4   Period Weeks   Status New   PT  LONG TERM GOAL #5   Title improve FOTO =/< 47% limited   Time 4   Period Weeks   Status New   PT LONG TERM GOAL #6   Title lift 50# floor to waist level without trouble   Time 4   Period Weeks   Status New               Plan - 10/22/14 0845    Clinical Impression Statement Flexion ROM improving; progressing towards goals.   PT Next Visit Plan ROM, strengthening   Consulted and Agree with Plan of Care Patient        Problem List Patient Active Problem List   Diagnosis Date Noted  . Obese 09/30/2014  . S/P right TKA 09/29/2014  . S/P knee replacement 09/29/2014  . Excessive cerumen in ear canal 09/02/2014  . Alcohol use 07/06/2014  . Osteoarthritis of right knee 05/26/2014  . Erectile dysfunction 05/26/2014  . Numbness and tingling in left arm 07/02/2013  . Sensation of fullness in left ear 05/02/2013  . Back pain 05/02/2013  . GERD (gastroesophageal reflux disease) 10/02/2012  . Umbilical hernia 27/12/2374  . Hypertension 07/29/2012  . Hyperlipidemia 07/29/2012  . Obesity (BMI 30-39.9) 07/29/2012   Laureen Abrahams, PT, DPT 10/22/2014 9:27 AM  [facility] [place], [place], [address] Phone: [phone]   Fax:  [phone]

## 2014-10-23 ENCOUNTER — Ambulatory Visit (INDEPENDENT_AMBULATORY_CARE_PROVIDER_SITE_OTHER): Payer: PRIVATE HEALTH INSURANCE | Admitting: Physical Therapy

## 2014-10-23 DIAGNOSIS — M7989 Other specified soft tissue disorders: Secondary | ICD-10-CM

## 2014-10-23 DIAGNOSIS — M25661 Stiffness of right knee, not elsewhere classified: Secondary | ICD-10-CM

## 2014-10-23 DIAGNOSIS — M25561 Pain in right knee: Secondary | ICD-10-CM

## 2014-10-23 NOTE — Therapy (Signed)
[place], [place], [address] Phone: [phone]   Fax:  [phone]  Physical Therapy Treatment  Patient Details  Name: Jim Hunter MRN: [id number] Date of Birth: 02-27-1966 Referring Provider:  Paralee Cancel, MD  Encounter Date: 10/23/2014      PT End of Session - 10/23/14 0802    Visit Number 3   Number of Visits 12   Date for PT Re-Evaluation 11/17/14   PT Start Time 0803   PT Stop Time 0911   PT Time Calculation (min) 68 min   Activity Tolerance Patient tolerated treatment well   Behavior During Therapy [facility] for tasks assessed/performed      Past Medical History  Diagnosis Date  . Hypertension   . Hyperlipidemia   . Arthritis   . Sleep apnea 3 years ago    no borderline no cpap needed per sleep studty done  . GERD (gastroesophageal reflux disease)   . PPD screening test last 9 years ago first started as child    treated as child for positive ppd test    Past Surgical History  Procedure Laterality Date  . Rotator cuff repair  2010    left  . Lumbar disc surgery  2006  . Tympanoplasty      age 89  . Total knee arthroplasty Right 09/29/2014    Procedure: RIGHT TOTAL KNEE ARTHROPLASTY;  Surgeon: Paralee Cancel, MD;  Location: [facility];  Service: Orthopedics;  Laterality: Right;    There were no vitals filed for this visit.  Visit Diagnosis:  Stiffness of knee joint, right  Pain in knee joint, right  Swelling of limb      Subjective Assessment - 10/23/14 0805    Subjective No new complaints since yesterday   Currently in Pain? Yes   Pain Score 6    Pain Location Knee   Pain Orientation Right   Pain Descriptors / Indicators Spasm;Aching   Pain Type Surgical pain   Pain Onset 1 to 4 weeks ago   Aggravating Factors  random spasms, pain when get out of bed in the morning in lower leg   Pain Relieving Factors meds, icing , rest   Multiple Pain Sites No            [facility] PT  Assessment - 10/23/14 0001    AROM   Right Knee Extension -12  in prone and supine   Right Knee Flexion 100                   [facility] Adult PT Treatment/Exercise - 10/23/14 0001    Knee/Hip Exercises: Stretches   Gastroc Stretch --  attempted wall stretch, then off step 2 x 50 sec   Knee/Hip Exercises: Aerobic   Stationary Bike Stationary bike x 5 min for ROM with revolutions fwd and bwd; then Nustep L5 seat from 8 to 6 x 6 min   Knee/Hip Exercises: Standing   Other Standing Knee Exercises 15 sec x 5, 2 sets TKE with ball behind knee    Modalities   Modalities Cryotherapy;Electrical Stimulation   Cryotherapy   Number Minutes Cryotherapy 15 Minutes   Cryotherapy Location Knee  Rt   Type of Cryotherapy Other (comment)  vaso med pressure   Electrical Stimulation   Electrical Stimulation Location Rt knee   Electrical Stimulation Action ion repelling   Electrical Stimulation Parameters to tolerance   Electrical Stimulation Goals Edema;Pain   Manual Therapy   Manual Therapy Joint  mobilization;Myofascial release;Passive ROM   Joint Mobilization supine Rt knee grade III/IV mobs for knee ext   Myofascial Release Sidelying Rt ITB release, prone right hamstrings   Passive ROM seated rt knee flexion with contralateral knee extension; rt knee ext in supine                PT Education - 10/23/14 0902    Education provided Yes   Education Details HEP  advised prolonged prone knee ext stretch up to 5 min as tol   Person(s) Educated Patient   Methods Explanation   Comprehension Verbalized understanding          PT Short Term Goals - 10/23/14 0906    PT SHORT TERM GOAL #1   Time 2   Period Weeks   Status On-going   PT SHORT TERM GOAL #2   Title increase Rt knee extension to -8 degrees   Time 2   Period Weeks   Status On-going   PT SHORT TERM GOAL #3   Title increase Rt knee flexion 115 degrees   Time 2   Period Weeks   Status On-going   PT SHORT TERM GOAL  #4   Title ambulate without assistive device on even surfaces   Time 2   Period Weeks   Status New           PT Long Term Goals - 10/23/14 0907    PT LONG TERM GOAL #1   Title I with advanced HEP   Time 4   Period Weeks   Status On-going   PT LONG TERM GOAL #2   Title ambulate on even/uneven surfaces and stairs with normalized gait   Time 4   Period Weeks   Status On-going   PT LONG TERM GOAL #3   Title increased strength Rt hip =/> 5/5   Time 4   Period Weeks   Status On-going   PT LONG TERM GOAL #4   Title demo Rt knee ROM WFL    Time 4   Period Weeks   Status On-going   PT LONG TERM GOAL #5   Title improve FOTO =/< 47% limited   Time 4   Period Weeks   Status On-going               Plan - 10/23/14 0903    Clinical Impression Statement Patient tolerated treatment well. Patient has difficulty with prolonged stretches due to not being able to keep still. Patient has tightness in Rt ITB and hamstrings and will benefit from further STW. Patient demos gains in knee extension but is still significantly limited affecting gait.    Pt will benefit from skilled therapeutic intervention in order to improve on the following deficits Abnormal gait;Decreased range of motion;Difficulty walking;Impaired flexibility;Decreased skin integrity;Pain;Decreased mobility;Decreased strength;Decreased balance   Rehab Potential Excellent   PT Frequency 3x / week   PT Duration 4 weeks   PT Treatment/Interventions Moist Heat;Therapeutic activities;Patient/family education;Scar mobilization;Passive range of motion;Therapeutic exercise;DME Instruction;Ultrasound;Gait training;Balance training;Manual techniques;Neuromuscular re-education;Stair training;Cryotherapy;Electrical Stimulation   PT Next Visit Plan ROM, strengthening, continue manual STW/mobs   Consulted and Agree with Plan of Care Patient        Problem List Patient Active Problem List   Diagnosis Date Noted  . Obese  09/30/2014  . S/P right TKA 09/29/2014  . S/P knee replacement 09/29/2014  . Excessive cerumen in ear canal 09/02/2014  . Alcohol use 07/06/2014  . Osteoarthritis of right knee 05/26/2014  . Erectile dysfunction  05/26/2014  . Numbness and tingling in left arm 07/02/2013  . Sensation of fullness in left ear 05/02/2013  . Back pain 05/02/2013  . GERD (gastroesophageal reflux disease) 10/02/2012  . Umbilical hernia 16/04/9603  . Hypertension 07/29/2012  . Hyperlipidemia 07/29/2012  . Obesity (BMI 30-39.9) 07/29/2012    Madelyn Flavors PT  10/23/2014, 9:16 AM  [facility] [place], [place], [address] Phone: [phone]   Fax:  [phone]

## 2014-10-27 ENCOUNTER — Ambulatory Visit (INDEPENDENT_AMBULATORY_CARE_PROVIDER_SITE_OTHER): Payer: PRIVATE HEALTH INSURANCE | Admitting: Physical Therapy

## 2014-10-27 ENCOUNTER — Encounter: Payer: Self-pay | Admitting: Physical Therapy

## 2014-10-27 DIAGNOSIS — M6281 Muscle weakness (generalized): Secondary | ICD-10-CM

## 2014-10-27 DIAGNOSIS — M25661 Stiffness of right knee, not elsewhere classified: Secondary | ICD-10-CM

## 2014-10-27 DIAGNOSIS — M7989 Other specified soft tissue disorders: Secondary | ICD-10-CM

## 2014-10-27 DIAGNOSIS — M7981 Nontraumatic hematoma of soft tissue: Secondary | ICD-10-CM

## 2014-10-27 DIAGNOSIS — R269 Unspecified abnormalities of gait and mobility: Secondary | ICD-10-CM | POA: Diagnosis not present

## 2014-10-27 DIAGNOSIS — M25561 Pain in right knee: Secondary | ICD-10-CM | POA: Diagnosis not present

## 2014-10-27 NOTE — Therapy (Signed)
[place], [place], [address] Phone: [phone]   Fax:  [phone]  Physical Therapy Treatment  Patient Details  Name: Jim Hunter MRN: [id number] Date of Birth: 18-Aug-1965 Referring Provider:  Paralee Cancel, MD  Encounter Date: 10/27/2014      PT End of Session - 10/27/14 0844    Visit Number 4   Number of Visits 12   Date for PT Re-Evaluation 11/17/14   PT Start Time 0840   PT Stop Time 0944   PT Time Calculation (min) 64 min   Activity Tolerance Patient tolerated treatment well      Past Medical History  Diagnosis Date  . Hypertension   . Hyperlipidemia   . Arthritis   . Sleep apnea 3 years ago    no borderline no cpap needed per sleep studty done  . GERD (gastroesophageal reflux disease)   . PPD screening test last 9 years ago first started as child    treated as child for positive ppd test    Past Surgical History  Procedure Laterality Date  . Rotator cuff repair  2010    left  . Lumbar disc surgery  2006  . Tympanoplasty      age 41  . Total knee arthroplasty Right 09/29/2014    Procedure: RIGHT TOTAL KNEE ARTHROPLASTY;  Surgeon: Paralee Cancel, MD;  Location: [facility];  Service: Orthopedics;  Laterality: Right;    There were no vitals filed for this visit.  Visit Diagnosis:  Stiffness of knee joint, right  Pain in knee joint, right  Swelling of limb  Abnormality of gait  Muscle weakness (generalized)      Subjective Assessment - 10/27/14 0845    Subjective Pt reports Rt knee pain continues to wake him up in the night. Pt has been taking 1 mile walks with wife daily, some discomfort in calf.    Pain Score 6    Pain Location Knee   Pain Orientation Right   Pain Descriptors / Indicators Aching;Spasm   Aggravating Factors  bending it, prolonged stillness   Pain Relieving Factors medicine, icing, rest             [facility] PT Assessment - 10/27/14 0001    Assessment   Medical Diagnosis Rt TKR   Onset Date 09/29/14   Next MD Visit 10/28/14   AROM   AROM Assessment Site Knee   Right/Left Knee Right   Right Knee Extension -12   Right Knee Flexion 106                   [facility] Adult PT Treatment/Exercise - 10/27/14 0001    Ambulation/Gait   Ambulation/Gait Yes   Ambulation/Gait Assistance 6: Modified independent (Device/Increase time)  slow speed    Ambulation Distance (Feet) 200 Feet   Assistive device None   Gait Pattern Decreased stance time - right;Decreased step length - left;Antalgic;Poor foot clearance - right   Gait Comments VC for Rt heel strike, increased toe off and Rt knee flexion, decreased step length Rt. Improved with repetition and post VC.    Knee/Hip Exercises: Stretches   Gastroc Stretch 2 reps;30 seconds   Soleus Stretch 2 reps;30 seconds   Knee/Hip Exercises: Aerobic   Stationary Bike partial revolutions to full revolutions x 8 min   Knee/Hip Exercises: Standing   Heel Raises 2 sets;10 reps   Terminal Knee Extension 1 set;Right;2 sets  5 sec hold with ball against wall, 2nd set  with band   Theraband Level (Terminal Knee Extension) Level 2 (Red)   Lateral Step Up 1 set;15 reps;Hand Hold: 1;Step Height: 6"   Forward Step Up 1 set;Right;15 reps;Hand Hold: 2;Step Height: 6"   Step Down 10 reps  Heel taps from 3" step    Other Standing Knee Exercises Standing weight shifts (forward backward) with focus on Rt knee flexion through toe off x 25 reps.    Modalities   Modalities Cryotherapy;Electrical Stimulation   Cryotherapy   Number Minutes Cryotherapy 15 Minutes   Cryotherapy Location Knee   Type of Cryotherapy --  vaso, med pressure, 3 snowflakes   Electrical Stimulation   Electrical Stimulation Location Rt knee   Electrical Stimulation Action IFC   Electrical Stimulation Parameters to tolerance    Electrical Stimulation Goals Pain                  PT Short Term Goals - 10/23/14 0906     PT SHORT TERM GOAL #1   Time 2   Period Weeks   Status On-going   PT SHORT TERM GOAL #2   Title increase Rt knee extension to -8 degrees   Time 2   Period Weeks   Status On-going   PT SHORT TERM GOAL #3   Title increase Rt knee flexion 115 degrees   Time 2   Period Weeks   Status On-going   PT SHORT TERM GOAL #4   Title ambulate without assistive device on even surfaces   Time 2   Period Weeks   Status New           PT Long Term Goals - 10/23/14 0907    PT LONG TERM GOAL #1   Title I with advanced HEP   Time 4   Period Weeks   Status On-going   PT LONG TERM GOAL #2   Title ambulate on even/uneven surfaces and stairs with normalized gait   Time 4   Period Weeks   Status On-going   PT LONG TERM GOAL #3   Title increased strength Rt hip =/> 5/5   Time 4   Period Weeks   Status On-going   PT LONG TERM GOAL #4   Title demo Rt knee ROM WFL    Time 4   Period Weeks   Status On-going   PT LONG TERM GOAL #5   Title improve FOTO =/< 47% limited   Time 4   Period Weeks   Status On-going               Plan - 10/27/14 1017    Clinical Impression Statement Pt demonstrated improved Rt knee flexion ROM, however no change in knee ext from last visit. Pt's gait quality improved with VC and repetition. Pt tolerated exercises with minimal increase in symptoms.  Pt making progress towards goals.    Pt will benefit from skilled therapeutic intervention in order to improve on the following deficits Abnormal gait;Decreased range of motion;Difficulty walking;Impaired flexibility;Decreased skin integrity;Pain;Decreased mobility;Decreased strength;Decreased balance   Rehab Potential Excellent   PT Frequency 3x / week   PT Duration 4 weeks   PT Treatment/Interventions Moist Heat;Therapeutic activities;Patient/family education;Scar mobilization;Passive range of motion;Therapeutic exercise;DME Instruction;Ultrasound;Gait training;Balance training;Manual techniques;Neuromuscular  re-education;Stair training;Cryotherapy;Electrical Stimulation   PT Next Visit Plan Continue ROM and progressive strengthening for Rt knee.    Consulted and Agree with Plan of Care Patient        Problem List Patient Active Problem List   Diagnosis  Date Noted  . Obese 09/30/2014  . S/P right TKA 09/29/2014  . S/P knee replacement 09/29/2014  . Excessive cerumen in ear canal 09/02/2014  . Alcohol use 07/06/2014  . Osteoarthritis of right knee 05/26/2014  . Erectile dysfunction 05/26/2014  . Numbness and tingling in left arm 07/02/2013  . Sensation of fullness in left ear 05/02/2013  . Back pain 05/02/2013  . GERD (gastroesophageal reflux disease) 10/02/2012  . Umbilical hernia 29/08/1113  . Hypertension 07/29/2012  . Hyperlipidemia 07/29/2012  . Obesity (BMI 30-39.9) 07/29/2012    Kerin Perna, PTA 10/27/2014 11:01 AM  [place], [place], [address] Phone: [phone]   Fax:  [phone]

## 2014-10-28 ENCOUNTER — Encounter: Payer: Self-pay | Admitting: Physical Therapy

## 2014-10-29 ENCOUNTER — Ambulatory Visit (INDEPENDENT_AMBULATORY_CARE_PROVIDER_SITE_OTHER): Payer: 59 | Admitting: Physical Therapy

## 2014-10-29 DIAGNOSIS — M25661 Stiffness of right knee, not elsewhere classified: Secondary | ICD-10-CM | POA: Diagnosis not present

## 2014-10-29 DIAGNOSIS — R269 Unspecified abnormalities of gait and mobility: Secondary | ICD-10-CM

## 2014-10-29 DIAGNOSIS — M25561 Pain in right knee: Secondary | ICD-10-CM | POA: Diagnosis not present

## 2014-10-29 DIAGNOSIS — M6281 Muscle weakness (generalized): Secondary | ICD-10-CM

## 2014-10-29 DIAGNOSIS — M7989 Other specified soft tissue disorders: Secondary | ICD-10-CM | POA: Diagnosis not present

## 2014-10-29 NOTE — Therapy (Signed)
[place], [place], [address] Phone: [phone]   Fax:  [phone]  Physical Therapy Treatment  Patient Details  Name: Jim Hunter MRN: [id number] Date of Birth: 1965-09-23 Referring Provider:  Paralee Cancel, MD  Encounter Date: 10/29/2014      PT End of Session - 10/29/14 1154    Visit Number 5   Number of Visits 12   Date for PT Re-Evaluation 11/17/14   PT Start Time 1150   PT Stop Time 1244   PT Time Calculation (min) 54 min   Activity Tolerance Patient tolerated treatment well      Past Medical History  Diagnosis Date  . Hypertension   . Hyperlipidemia   . Arthritis   . Sleep apnea 3 years ago    no borderline no cpap needed per sleep studty done  . GERD (gastroesophageal reflux disease)   . PPD screening test last 9 years ago first started as child    treated as child for positive ppd test    Past Surgical History  Procedure Laterality Date  . Rotator cuff repair  2010    left  . Lumbar disc surgery  2006  . Tympanoplasty      age 49  . Total knee arthroplasty Right 09/29/2014    Procedure: RIGHT TOTAL KNEE ARTHROPLASTY;  Surgeon: Paralee Cancel, MD;  Location: [facility];  Service: Orthopedics;  Laterality: Right;    There were no vitals filed for this visit.  Visit Diagnosis:  Stiffness of knee joint, right  Pain in knee joint, right  Swelling of limb  Abnormality of gait  Muscle weakness (generalized)      Subjective Assessment - 10/29/14 1154    Subjective Pt reports he averages 4 hr sleep before waking from pain. (Before surgery averaged 5-6 hr sleep).    Currently in Pain? Yes   Pain Score 5             [facility] PT Assessment - 10/29/14 0001    Assessment   Medical Diagnosis Rt TKR   Onset Date 09/29/14   AROM   AROM Assessment Site Knee   Right/Left Knee Right   Right Knee Flexion 106                   [facility] Adult PT Treatment/Exercise -  10/29/14 0001    Ambulation/Gait   Ambulation/Gait Yes   Ambulation Distance (Feet) --  laps around gym   Assistive device None   Gait Pattern Decreased stance time - right;Right steppage   Gait velocity decreased speed   Gait Comments Pt demonstrated exaggerated hip flexion and DF in toe off to swing through in attempt to avoid decreased foot clearance. Improved step length and heel strike.    Knee/Hip Exercises: Stretches   Active Hamstring Stretch --  (15 reps-see standing lunges)   Gastroc Stretch 2 reps;30 seconds   Knee/Hip Exercises: Aerobic   Stationary Bike partial revolutions to full revolutions x 8 min   Knee/Hip Exercises: Standing   Heel Raises 3 sets;10 reps  toes straight, in, out x 10 each   Terminal Knee Extension Right;Theraband;10 reps;1 set   Theraband Level (Terminal Knee Extension) Level 3 (Green)   Terminal Knee Extension Limitations Compensation with hip noted despite multiple cues to only engage Rt quad.    Other Standing Knee Exercises forward lunges on 13" step (to increase Rt knee flexion) followed by hamstring stretch x 15   Knee/Hip Exercises:  Supine   Bridges 2 sets;10 reps  with ball squeeze for 2nd set   Straight Leg Raise with External Rotation Strengthening;Right;1 set;15 reps   Knee/Hip Exercises: Prone   Other Prone Exercises TKE 5sec x 15   Modalities   Modalities Cryotherapy;Electrical Stimulation   Cryotherapy   Number Minutes Cryotherapy 15 Minutes   Cryotherapy Location Knee   Type of Cryotherapy --  vaso, med pressure, 3 snowflakes   Electrical Stimulation   Electrical Stimulation Location Rt knee   Electrical Stimulation Action ion repelling/ high volt   Electrical Stimulation Parameters to tolerance    Electrical Stimulation Goals Pain;Edema                  PT Short Term Goals - 10/23/14 0906    PT SHORT TERM GOAL #1   Time 2   Period Weeks   Status On-going   PT SHORT TERM GOAL #2   Title increase Rt knee  extension to -8 degrees   Time 2   Period Weeks   Status On-going   PT SHORT TERM GOAL #3   Title increase Rt knee flexion 115 degrees   Time 2   Period Weeks   Status On-going   PT SHORT TERM GOAL #4   Title ambulate without assistive device on even surfaces   Time 2   Period Weeks   Status New           PT Long Term Goals - 10/23/14 0907    PT LONG TERM GOAL #1   Title I with advanced HEP   Time 4   Period Weeks   Status On-going   PT LONG TERM GOAL #2   Title ambulate on even/uneven surfaces and stairs with normalized gait   Time 4   Period Weeks   Status On-going   PT LONG TERM GOAL #3   Title increased strength Rt hip =/> 5/5   Time 4   Period Weeks   Status On-going   PT LONG TERM GOAL #4   Title demo Rt knee ROM WFL    Time 4   Period Weeks   Status On-going   PT LONG TERM GOAL #5   Title improve FOTO =/< 47% limited   Time 4   Period Weeks   Status On-going               Plan - 10/29/14 1234    Clinical Impression Statement Pt tolerated all new exercises well, without increase in symptoms. Pt able to complete TKE RLE in prone with better form and quad activation than in standing. Progressing towards goals.    Pt will benefit from skilled therapeutic intervention in order to improve on the following deficits Abnormal gait;Decreased range of motion;Difficulty walking;Impaired flexibility;Decreased skin integrity;Pain;Decreased mobility;Decreased strength;Decreased balance   Rehab Potential Excellent   PT Frequency 3x / week   PT Duration 4 weeks   PT Treatment/Interventions Moist Heat;Therapeutic activities;Patient/family education;Scar mobilization;Passive range of motion;Therapeutic exercise;DME Instruction;Ultrasound;Gait training;Balance training;Manual techniques;Neuromuscular re-education;Stair training;Cryotherapy;Electrical Stimulation   PT Next Visit Plan Continue ROM and progressive strengthening for Rt knee.    Consulted and Agree with  Plan of Care Patient        Problem List Patient Active Problem List   Diagnosis Date Noted  . Obese 09/30/2014  . S/P right TKA 09/29/2014  . S/P knee replacement 09/29/2014  . Excessive cerumen in ear canal 09/02/2014  . Alcohol use 07/06/2014  . Osteoarthritis of right knee 05/26/2014  .  Erectile dysfunction 05/26/2014  . Numbness and tingling in left arm 07/02/2013  . Sensation of fullness in left ear 05/02/2013  . Back pain 05/02/2013  . GERD (gastroesophageal reflux disease) 10/02/2012  . Umbilical hernia 05/13/2535  . Hypertension 07/29/2012  . Hyperlipidemia 07/29/2012  . Obesity (BMI 30-39.9) 07/29/2012   Kerin Perna, PTA 10/29/2014 12:42 PM   [place], [place], [address] Phone: [phone]   Fax:  [phone]

## 2014-10-30 ENCOUNTER — Ambulatory Visit (INDEPENDENT_AMBULATORY_CARE_PROVIDER_SITE_OTHER): Payer: PRIVATE HEALTH INSURANCE | Admitting: Physical Therapy

## 2014-10-30 DIAGNOSIS — M7989 Other specified soft tissue disorders: Secondary | ICD-10-CM

## 2014-10-30 DIAGNOSIS — M25561 Pain in right knee: Secondary | ICD-10-CM

## 2014-10-30 DIAGNOSIS — M25661 Stiffness of right knee, not elsewhere classified: Secondary | ICD-10-CM

## 2014-10-30 DIAGNOSIS — M6281 Muscle weakness (generalized): Secondary | ICD-10-CM | POA: Diagnosis not present

## 2014-10-30 DIAGNOSIS — R269 Unspecified abnormalities of gait and mobility: Secondary | ICD-10-CM

## 2014-10-30 NOTE — Therapy (Signed)
[facility] [place], [place], [address] Phone: [phone]   Fax:  [phone]  Physical Therapy Treatment  Patient Details  Name: Jim Hunter MRN: [id number] Date of Birth: 23-Jan-1966 Referring Provider:  Paralee Cancel, MD  Encounter Date: 10/30/2014      PT End of Session - 10/30/14 0905    Visit Number 6   Number of Visits 12   Date for PT Re-Evaluation 11/17/14   PT Start Time 0845   PT Stop Time 0945   PT Time Calculation (min) 60 min   Activity Tolerance Patient tolerated treatment well      Past Medical History  Diagnosis Date  . Hypertension   . Hyperlipidemia   . Arthritis   . Sleep apnea 3 years ago    no borderline no cpap needed per sleep studty done  . GERD (gastroesophageal reflux disease)   . PPD screening test last 9 years ago first started as child    treated as child for positive ppd test    Past Surgical History  Procedure Laterality Date  . Rotator cuff repair  2010    left  . Lumbar disc surgery  2006  . Tympanoplasty      age 49  . Total knee arthroplasty Right 09/29/2014    Procedure: RIGHT TOTAL KNEE ARTHROPLASTY;  Surgeon: Paralee Cancel, MD;  Location: [facility];  Service: Orthopedics;  Laterality: Right;    There were no vitals filed for this visit.  Visit Diagnosis:  Stiffness of knee joint, right  Pain in knee joint, right  Swelling of limb  Abnormality of gait  Muscle weakness (generalized)      Subjective Assessment - 10/30/14 1228    Subjective Pt had nothing new to report.    Currently in Pain? Yes   Pain Score 5    Pain Location Knee   Pain Orientation Right            [facility] PT Assessment - 10/30/14 0001    Assessment   Medical Diagnosis Rt TKR   Onset Date 09/29/14   AROM   AROM Assessment Site Knee   Right/Left Knee Right   Right Knee Extension -10   Right Knee Flexion 112                   [facility] Adult PT  Treatment/Exercise - 10/30/14 0001    Knee/Hip Exercises: Stretches   Gastroc Stretch 2 reps;30 seconds   Knee/Hip Exercises: Aerobic   Stationary Bike L1 x 1 x 6 min full revolutions    Knee/Hip Exercises: Machines for Strengthening   Cybex Leg Press 9 plates x 20 reps   Knee/Hip Exercises: Standing   Heel Raises 3 sets;10 reps  toes straight, in, out x 10 each   Lateral Step Up Right;2 sets;10 reps;Hand Hold: 1;Step Height: 8"   Forward Step Up Right;2 sets;10 reps;Hand Hold: 1;Step Height: 8"   SLS on Bosu x 20 sec trials without UE support x 3    Other Standing Knee Exercises forward lunges on 13" step (to increase Rt knee flexion) followed by hamstring stretch x 15   Knee/Hip Exercises: Prone   Other Prone Exercises TKE 5sec x 15   Modalities   Modalities Cryotherapy;Electrical Stimulation   Cryotherapy   Number Minutes Cryotherapy 15 Minutes   Cryotherapy Location Knee   Electrical Stimulation   Electrical Stimulation Location Rt knee   Agricultural consultant  Stimulation Parameters to tolerance   Electrical Stimulation Goals Edema;Pain                  PT Short Term Goals - 10/23/14 0906    PT SHORT TERM GOAL #1   Time 2   Period Weeks   Status On-going   PT SHORT TERM GOAL #2   Title increase Rt knee extension to -8 degrees   Time 2   Period Weeks   Status On-going   PT SHORT TERM GOAL #3   Title increase Rt knee flexion 115 degrees   Time 2   Period Weeks   Status On-going   PT SHORT TERM GOAL #4   Title ambulate without assistive device on even surfaces   Time 2   Period Weeks   Status New           PT Long Term Goals - 10/23/14 0907    PT LONG TERM GOAL #1   Title I with advanced HEP   Time 4   Period Weeks   Status On-going   PT LONG TERM GOAL #2   Title ambulate on even/uneven surfaces and stairs with normalized gait   Time 4   Period Weeks   Status On-going   PT LONG TERM GOAL #3   Title  increased strength Rt hip =/> 5/5   Time 4   Period Weeks   Status On-going   PT LONG TERM GOAL #4   Title demo Rt knee ROM WFL    Time 4   Period Weeks   Status On-going   PT LONG TERM GOAL #5   Title improve FOTO =/< 47% limited   Time 4   Period Weeks   Status On-going               Plan - 10/30/14 1226    Clinical Impression Statement Pt demo improved R knee flexion this session; extension lacking 10 deg.  Pt tolerated all new exercises with minimal increase in pain.    Pt will benefit from skilled therapeutic intervention in order to improve on the following deficits Abnormal gait;Decreased range of motion;Difficulty walking;Impaired flexibility;Decreased skin integrity;Pain;Decreased mobility;Decreased strength;Decreased balance   Rehab Potential Excellent   PT Frequency 3x / week   PT Duration 4 weeks   PT Treatment/Interventions Moist Heat;Therapeutic activities;Patient/family education;Scar mobilization;Passive range of motion;Therapeutic exercise;DME Instruction;Ultrasound;Gait training;Balance training;Manual techniques;Neuromuscular re-education;Stair training;Cryotherapy;Electrical Stimulation   PT Next Visit Plan Continue ROM and progressive strengthening for Rt knee.    Consulted and Agree with Plan of Care Patient        Problem List Patient Active Problem List   Diagnosis Date Noted  . Obese 09/30/2014  . S/P right TKA 09/29/2014  . S/P knee replacement 09/29/2014  . Excessive cerumen in ear canal 09/02/2014  . Alcohol use 07/06/2014  . Osteoarthritis of right knee 05/26/2014  . Erectile dysfunction 05/26/2014  . Numbness and tingling in left arm 07/02/2013  . Sensation of fullness in left ear 05/02/2013  . Back pain 05/02/2013  . GERD (gastroesophageal reflux disease) 10/02/2012  . Umbilical hernia 16/04/9603  . Hypertension 07/29/2012  . Hyperlipidemia 07/29/2012  . Obesity (BMI 30-39.9) 07/29/2012    Kerin Perna, PTA 10/30/2014  12:33 PM  [place], [place], [address] Phone: [phone]   Fax:  [phone]

## 2014-11-02 ENCOUNTER — Ambulatory Visit (INDEPENDENT_AMBULATORY_CARE_PROVIDER_SITE_OTHER): Payer: PRIVATE HEALTH INSURANCE | Admitting: Physical Therapy

## 2014-11-02 DIAGNOSIS — M25661 Stiffness of right knee, not elsewhere classified: Secondary | ICD-10-CM | POA: Diagnosis not present

## 2014-11-02 DIAGNOSIS — M7989 Other specified soft tissue disorders: Secondary | ICD-10-CM

## 2014-11-02 DIAGNOSIS — M6281 Muscle weakness (generalized): Secondary | ICD-10-CM

## 2014-11-02 DIAGNOSIS — R269 Unspecified abnormalities of gait and mobility: Secondary | ICD-10-CM

## 2014-11-02 DIAGNOSIS — M25561 Pain in right knee: Secondary | ICD-10-CM

## 2014-11-02 NOTE — Therapy (Signed)
[facility] [place], [place], [address] Phone: [phone]   Fax:  [phone]  Physical Therapy Treatment  Patient Details  Name: Jim Hunter MRN: [id number] Date of Birth: 09/16/1965 Referring Provider:  Paralee Cancel, MD  Encounter Date: 11/02/2014      PT End of Session - 11/02/14 0847    Visit Number 7   Number of Visits 12   Date for PT Re-Evaluation 11/17/14   PT Start Time 0846   PT Stop Time 0945   PT Time Calculation (min) 59 min      Past Medical History  Diagnosis Date  . Hypertension   . Hyperlipidemia   . Arthritis   . Sleep apnea 3 years ago    no borderline no cpap needed per sleep studty done  . GERD (gastroesophageal reflux disease)   . PPD screening test last 9 years ago first started as child    treated as child for positive ppd test    Past Surgical History  Procedure Laterality Date  . Rotator cuff repair  2010    left  . Lumbar disc surgery  2006  . Tympanoplasty      age 36  . Total knee arthroplasty Right 09/29/2014    Procedure: RIGHT TOTAL KNEE ARTHROPLASTY;  Surgeon: Paralee Cancel, MD;  Location: [facility];  Service: Orthopedics;  Laterality: Right;    There were no vitals filed for this visit.  Visit Diagnosis:  Stiffness of knee joint, right  Pain in knee joint, right  Swelling of limb  Abnormality of gait  Muscle weakness (generalized)      Subjective Assessment - 11/02/14 0847    Subjective Pt reporting he's waking up every other hour or 2 due to pain. Positional changes and medicine help decrease pain.    Currently in Pain? Yes   Pain Score 6   with muscle spasms up to 8/10   Pain Location Knee   Pain Orientation Right   Pain Descriptors / Indicators Aching;Spasm   Aggravating Factors  moving knee into certain positions   Pain Relieving Factors medicine, ice, rest             [facility] PT Assessment - 11/02/14 0001    Assessment   Medical  Diagnosis Rt TKR   Onset Date 09/29/14   AROM   AROM Assessment Site Knee   Right/Left Knee Right   Right Knee Extension -9   Right Knee Flexion 115   Strength   Strength Assessment Site Knee;Hip   Right/Left Hip Right   Right Hip Extension 4+/5   Right Hip ABduction --  5-/5                   [facility] Adult PT Treatment/Exercise - 11/02/14 0001    Knee/Hip Exercises: Aerobic   Stationary Bike L2 x 5 min    Knee/Hip Exercises: Standing   Other Standing Knee Exercises forward lunges on 13" step (to increase Rt knee flexion) followed by hamstring stretch x 15   Knee/Hip Exercises: Seated   Other Seated Knee Exercises seated scoots to edge of mat x 5 reps    Knee/Hip Exercises: Prone   Hamstring Curl 1 set;15 reps  with 5# weight on ankle   Other Prone Exercises TKE 5sec x 15   Modalities   Modalities Cryotherapy;Ultrasound;Electrical Stimulation   Cryotherapy   Number Minutes Cryotherapy 15 Minutes   Cryotherapy Location Knee   Type of Cryotherapy --  vaso  Psychologist, sport and exercise Parameters to tolerance    Electrical Stimulation Goals Edema;Pain   Ultrasound   Ultrasound Location posterior Rt knee at med hamstring insertion    Ultrasound Parameters 100%, 3.3 mHz, 1.2 w/cm2    Ultrasound Goals Pain                  PT Short Term Goals - 11/02/14 4098    PT SHORT TERM GOAL #1   Title I with Knee ROM exercise   Time 2   Period Weeks   Status Achieved   PT SHORT TERM GOAL #2   Title increase Rt knee extension to -8 degrees   Time 2   Period Weeks   Status On-going   PT SHORT TERM GOAL #3   Title increase Rt knee flexion 115 degrees   Time 2   Period Weeks   Status Partially Met   PT SHORT TERM GOAL #4   Title ambulate without assistive device on even surfaces   Time 2   Period Weeks   Status Achieved   PT SHORT TERM GOAL #5    Title reduce pain =/> 50% with dialy activity   Time 2   Period Weeks   Status Achieved           PT Long Term Goals - 10/23/14 1191    PT LONG TERM GOAL #1   Title I with advanced HEP   Time 4   Period Weeks   Status On-going   PT LONG TERM GOAL #2   Title ambulate on even/uneven surfaces and stairs with normalized gait   Time 4   Period Weeks   Status On-going   PT LONG TERM GOAL #3   Title increased strength Rt hip =/> 5/5   Time 4   Period Weeks   Status On-going   PT LONG TERM GOAL #4   Title demo Rt knee ROM WFL    Time 4   Period Weeks   Status On-going   PT LONG TERM GOAL #5   Title improve FOTO =/< 47% limited   Time 4   Period Weeks   Status On-going               Plan - 11/02/14 1028    Clinical Impression Statement Pt demo improved R knee ROM this session; slowly progressing with extension.  Pt has difficulty tolerating prone position during exercise and [place] due to pain in Rt hamstrings with prolonged stretch.  Pt has met STG #1,3, and 5.    Pt will benefit from skilled therapeutic intervention in order to improve on the following deficits Abnormal gait;Decreased range of motion;Difficulty walking;Impaired flexibility;Decreased skin integrity;Pain;Decreased mobility;Decreased strength;Decreased balance   Rehab Potential Excellent   PT Frequency 3x / week   PT Duration 4 weeks   PT Treatment/Interventions Moist Heat;Therapeutic activities;Patient/family education;Scar mobilization;Passive range of motion;Therapeutic exercise;DME Instruction;Ultrasound;Gait training;Balance training;Manual techniques;Neuromuscular re-education;Stair training;Cryotherapy;Electrical Stimulation   PT Next Visit Plan Continue ROM and progressive strengthening for Rt knee.    Consulted and Agree with Plan of Care Patient        Problem List Patient Active Problem List   Diagnosis Date Noted  . Obese 09/30/2014  . S/P right TKA 09/29/2014  . S/P knee replacement  09/29/2014  . Excessive cerumen in ear canal 09/02/2014  . Alcohol use 07/06/2014  . Osteoarthritis of right knee 05/26/2014  .  Erectile dysfunction 05/26/2014  . Numbness and tingling in left arm 07/02/2013  . Sensation of fullness in left ear 05/02/2013  . Back pain 05/02/2013  . GERD (gastroesophageal reflux disease) 10/02/2012  . Umbilical hernia 79/81/0254  . Hypertension 07/29/2012  . Hyperlipidemia 07/29/2012  . Obesity (BMI 30-39.9) 07/29/2012    Kerin Perna, PTA 11/02/2014 10:33 AM  [place], [place], [address] Phone: [phone]   Fax:  [phone]

## 2014-11-04 ENCOUNTER — Ambulatory Visit (INDEPENDENT_AMBULATORY_CARE_PROVIDER_SITE_OTHER): Payer: PRIVATE HEALTH INSURANCE | Admitting: Physical Therapy

## 2014-11-04 DIAGNOSIS — R269 Unspecified abnormalities of gait and mobility: Secondary | ICD-10-CM | POA: Diagnosis not present

## 2014-11-04 DIAGNOSIS — M7989 Other specified soft tissue disorders: Secondary | ICD-10-CM | POA: Diagnosis not present

## 2014-11-04 DIAGNOSIS — M25661 Stiffness of right knee, not elsewhere classified: Secondary | ICD-10-CM

## 2014-11-04 DIAGNOSIS — M25561 Pain in right knee: Secondary | ICD-10-CM | POA: Diagnosis not present

## 2014-11-04 DIAGNOSIS — M6281 Muscle weakness (generalized): Secondary | ICD-10-CM

## 2014-11-04 NOTE — Therapy (Signed)
[facility] [place], [place], [address] Phone: [phone]   Fax:  [phone]  Physical Therapy Treatment  Patient Details  Name: Jim Hunter MRN: [id number] Date of Birth: 1966-04-05 Referring Provider:  Paralee Cancel, MD  Encounter Date: 11/04/2014      PT End of Session - 11/04/14 0852    Visit Number 8   Number of Visits 12   Date for PT Re-Evaluation 11/17/14   PT Start Time 0848   PT Stop Time 0943   PT Time Calculation (min) 55 min   Activity Tolerance Patient tolerated treatment well      Past Medical History  Diagnosis Date  . Hypertension   . Hyperlipidemia   . Arthritis   . Sleep apnea 3 years ago    no borderline no cpap needed per sleep studty done  . GERD (gastroesophageal reflux disease)   . PPD screening test last 9 years ago first started as child    treated as child for positive ppd test    Past Surgical History  Procedure Laterality Date  . Rotator cuff repair  2010    left  . Lumbar disc surgery  2006  . Tympanoplasty      age 49  . Total knee arthroplasty Right 09/29/2014    Procedure: RIGHT TOTAL KNEE ARTHROPLASTY;  Surgeon: Paralee Cancel, MD;  Location: [facility];  Service: Orthopedics;  Laterality: Right;    There were no vitals filed for this visit.  Visit Diagnosis:  Stiffness of knee joint, right  Pain in knee joint, right  Swelling of limb  Abnormality of gait  Muscle weakness (generalized)          [facility] PT Assessment - 11/04/14 0001    Assessment   Medical Diagnosis Rt TKR   Onset Date 09/29/14   AROM   AROM Assessment Site Knee   Right/Left Knee Right   Right Knee Extension -8   Right Knee Flexion 115                     [facility] Adult PT Treatment/Exercise - 11/04/14 0001    Knee/Hip Exercises: Stretches   Active quadricep Stretch 30 seconds;3 reps with strap    Knee/Hip Exercises: Aerobic   Elliptical L2.5 x 4.5 min   Knee/Hip  Exercises: Machines for Strengthening   Cybex Knee Extension 2 plates: extension with BLE and controlled flexion with RLE x 10 x 2 sets (challenging!)   Knee/Hip Exercises: Seated   Other Seated Knee Exercises Long sitting R straight leg raises x 10; with hip abd/add x 10 reps x 2 sets    Knee/Hip Exercises: Prone   Hamstring Curl 2 sets;10 reps  7.5# on ankle   Other Prone Exercises TKE 5sec x 15   Modalities   Modalities Cryotherapy   Cryotherapy   Number Minutes Cryotherapy 15 Minutes   Cryotherapy Location Knee   Type of Cryotherapy --  vaso, med pressure   Electrical Stimulation   Electrical Stimulation Location Rt knee   Electrical Stimulation Action ion repelling    Electrical Stimulation Parameters to tolerance    Electrical Stimulation Goals Edema;Pain   Manual Therapy   Manual Therapy Joint mobilization   Joint Mobilization supine Rt patellar mobs (inf/sup and med/lat); Rt knee grade III mobs for knee ext                  PT Short Term Goals - 11/02/14 5916  PT SHORT TERM GOAL #1   Title I with Knee ROM exercise   Time 2   Period Weeks   Status Achieved   PT SHORT TERM GOAL #2   Title increase Rt knee extension to -8 degrees   Time 2   Period Weeks   Status On-going   PT SHORT TERM GOAL #3   Title increase Rt knee flexion 115 degrees   Time 2   Period Weeks   Status Partially Met   PT SHORT TERM GOAL #4   Title ambulate without assistive device on even surfaces   Time 2   Period Weeks   Status Achieved   PT SHORT TERM GOAL #5   Title reduce pain =/> 50% with dialy activity   Time 2   Period Weeks   Status Achieved           PT Long Term Goals - 10/23/14 8937    PT LONG TERM GOAL #1   Title I with advanced HEP   Time 4   Period Weeks   Status On-going   PT LONG TERM GOAL #2   Title ambulate on even/uneven surfaces and stairs with normalized gait   Time 4   Period Weeks   Status On-going   PT LONG TERM GOAL #3   Title increased  strength Rt hip =/> 5/5   Time 4   Period Weeks   Status On-going   PT LONG TERM GOAL #4   Title demo Rt knee ROM WFL    Time 4   Period Weeks   Status On-going   PT LONG TERM GOAL #5   Title improve FOTO =/< 47% limited   Time 4   Period Weeks   Status On-going               Plan - 11/04/14 3428    Clinical Impression Statement Pt gained 1 degree of extension following manual therapy. Pt tolerated new exercises with minimal increase in pain. Progressing well towards goals.    Pt will benefit from skilled therapeutic intervention in order to improve on the following deficits Abnormal gait;Decreased range of motion;Difficulty walking;Impaired flexibility;Decreased skin integrity;Pain;Decreased mobility;Decreased strength;Decreased balance   Rehab Potential Excellent   PT Frequency 3x / week   PT Duration 4 weeks   PT Treatment/Interventions Moist Heat;Therapeutic activities;Patient/family education;Scar mobilization;Passive range of motion;Therapeutic exercise;DME Instruction;Ultrasound;Gait training;Balance training;Manual techniques;Neuromuscular re-education;Stair training;Cryotherapy;Electrical Stimulation   PT Next Visit Plan Continue ROM and progressive strengthening for Rt knee.    Consulted and Agree with Plan of Care Patient        Problem List Patient Active Problem List   Diagnosis Date Noted  . Obese 09/30/2014  . S/P right TKA 09/29/2014  . S/P knee replacement 09/29/2014  . Excessive cerumen in ear canal 09/02/2014  . Alcohol use 07/06/2014  . Osteoarthritis of right knee 05/26/2014  . Erectile dysfunction 05/26/2014  . Numbness and tingling in left arm 07/02/2013  . Sensation of fullness in left ear 05/02/2013  . Back pain 05/02/2013  . GERD (gastroesophageal reflux disease) 10/02/2012  . Umbilical hernia 76/81/1572  . Hypertension 07/29/2012  . Hyperlipidemia 07/29/2012  . Obesity (BMI 30-39.9) 07/29/2012   Kerin Perna, PTA 11/04/2014  10:02 AM  [place], [place], [address] Phone: [phone]   Fax:  [phone]

## 2014-11-06 ENCOUNTER — Ambulatory Visit (INDEPENDENT_AMBULATORY_CARE_PROVIDER_SITE_OTHER): Payer: PRIVATE HEALTH INSURANCE | Admitting: Physical Therapy

## 2014-11-06 DIAGNOSIS — R269 Unspecified abnormalities of gait and mobility: Secondary | ICD-10-CM | POA: Diagnosis not present

## 2014-11-06 DIAGNOSIS — M25561 Pain in right knee: Secondary | ICD-10-CM | POA: Diagnosis not present

## 2014-11-06 DIAGNOSIS — M6281 Muscle weakness (generalized): Secondary | ICD-10-CM

## 2014-11-06 DIAGNOSIS — M7989 Other specified soft tissue disorders: Secondary | ICD-10-CM | POA: Diagnosis not present

## 2014-11-06 DIAGNOSIS — M25661 Stiffness of right knee, not elsewhere classified: Secondary | ICD-10-CM

## 2014-11-06 NOTE — Therapy (Signed)
[place], [place], [address] Phone: [phone]   Fax:  [phone]  Physical Therapy Treatment  Patient Details  Name: Jim Hunter MRN: [id number] Date of Birth: 1966/01/24 Referring Provider:  Paralee Cancel, MD  Encounter Date: 11/06/2014      PT End of Session - 11/06/14 0934    Visit Number 9   Number of Visits 12   Date for PT Re-Evaluation 11/17/14   PT Start Time 0845   PT Stop Time 0948   PT Time Calculation (min) 63 min   Activity Tolerance Patient tolerated treatment well      Past Medical History  Diagnosis Date  . Hypertension   . Hyperlipidemia   . Arthritis   . Sleep apnea 3 years ago    no borderline no cpap needed per sleep studty done  . GERD (gastroesophageal reflux disease)   . PPD screening test last 9 years ago first started as child    treated as child for positive ppd test    Past Surgical History  Procedure Laterality Date  . Rotator cuff repair  2010    left  . Lumbar disc surgery  2006  . Tympanoplasty      age 49  . Total knee arthroplasty Right 09/29/2014    Procedure: RIGHT TOTAL KNEE ARTHROPLASTY;  Surgeon: Paralee Cancel, MD;  Location: [facility];  Service: Orthopedics;  Laterality: Right;    There were no vitals filed for this visit.  Visit Diagnosis:  Stiffness of knee joint, right  Pain in knee joint, right  Swelling of limb  Abnormality of gait  Muscle weakness (generalized)          [facility] PT Assessment - 11/06/14 0001    Assessment   Medical Diagnosis Rt TKR   Onset Date 09/29/14                     [facility] Adult PT Treatment/Exercise - 11/06/14 0001    Knee/Hip Exercises: Stretches   Quad Stretch 3 reps;30 seconds  RLE   Press photographer 2 reps   Knee/Hip Exercises: Aerobic   Stationary Bike L2 x 5 min    Knee/Hip Exercises: Machines for Strengthening   Cybex Knee Extension 2 plates with RLE only x 8 reps  (challenging) ; 2 legs extended, 1 leg controlled descent x 10    Cybex Leg Press 9 plates x 10, 10 plates x 10   Knee/Hip Exercises: Standing   Heel Raises 2 sets;10 reps  1 set of 10 with RLE   SLS on Bosu x 20 sec trials without UE support x 3, Forward leans to chair x  10 each leg    Modalities   Modalities Electrical Stimulation;Cryotherapy   Cryotherapy   Number Minutes Cryotherapy 15 Minutes   Cryotherapy Location Knee   Electrical Stimulation   Electrical Stimulation Location Rt knee   Electrical Stimulation Action ion repelling   Electrical Stimulation Parameters to tolerance   Electrical Stimulation Goals Edema;Pain   Manual Therapy   Manual Therapy Other (comment);Joint mobilization   Joint Mobilization supine Rt patellar mobs in all directions.     Other Manual Therapy Ktape applied in fan shape across Rt knee to assist with edema reduction and desensitization                   PT Short Term Goals - 11/02/14 0854    PT SHORT TERM GOAL #1   Title  I with Knee ROM exercise   Time 2   Period Weeks   Status Achieved   PT SHORT TERM GOAL #2   Title increase Rt knee extension to -8 degrees   Time 2   Period Weeks   Status On-going   PT SHORT TERM GOAL #3   Title increase Rt knee flexion 115 degrees   Time 2   Period Weeks   Status Partially Met   PT SHORT TERM GOAL #4   Title ambulate without assistive device on even surfaces   Time 2   Period Weeks   Status Achieved   PT SHORT TERM GOAL #5   Title reduce pain =/> 50% with dialy activity   Time 2   Period Weeks   Status Achieved           PT Long Term Goals - 10/23/14 1610    PT LONG TERM GOAL #1   Title I with advanced HEP   Time 4   Period Weeks   Status On-going   PT LONG TERM GOAL #2   Title ambulate on even/uneven surfaces and stairs with normalized gait   Time 4   Period Weeks   Status On-going   PT LONG TERM GOAL #3   Title increased strength Rt hip =/> 5/5   Time 4   Period  Weeks   Status On-going   PT LONG TERM GOAL #4   Title demo Rt knee ROM WFL    Time 4   Period Weeks   Status On-going   PT LONG TERM GOAL #5   Title improve FOTO =/< 47% limited   Time 4   Period Weeks   Status On-going               Plan - 11/06/14 0936    Clinical Impression Statement Pt continues to report pain in R patella waking him at night.  He tolerated all new exercises and increased resistance without increased symptoms, except began to c/o back pain with SLS forward leans once complete. (Pain resolved with rest during cryotherapy to knee). Noted slight pitting edema on Rt lateral aspect of knee with gentle patella mobs. Pt continues to make gains toward goals.    Pt will benefit from skilled therapeutic intervention in order to improve on the following deficits Abnormal gait;Decreased range of motion;Difficulty walking;Impaired flexibility;Decreased skin integrity;Pain;Decreased mobility;Decreased strength;Decreased balance   Rehab Potential Excellent   PT Frequency 3x / week   PT Duration 4 weeks   PT Treatment/Interventions Moist Heat;Therapeutic activities;Patient/family education;Scar mobilization;Passive range of motion;Therapeutic exercise;DME Instruction;Ultrasound;Gait training;Balance training;Manual techniques;Neuromuscular re-education;Stair training;Cryotherapy;Electrical Stimulation   PT Next Visit Plan Continue ROM and progressive strengthening for Rt knee. Trial weighted UE lifts (LTG #6) for return to work.    Consulted and Agree with Plan of Care Patient        Problem List Patient Active Problem List   Diagnosis Date Noted  . Obese 09/30/2014  . S/P right TKA 09/29/2014  . S/P knee replacement 09/29/2014  . Excessive cerumen in ear canal 09/02/2014  . Alcohol use 07/06/2014  . Osteoarthritis of right knee 05/26/2014  . Erectile dysfunction 05/26/2014  . Numbness and tingling in left arm 07/02/2013  . Sensation of fullness in left ear  05/02/2013  . Back pain 05/02/2013  . GERD (gastroesophageal reflux disease) 10/02/2012  . Umbilical hernia 96/10/5407  . Hypertension 07/29/2012  . Hyperlipidemia 07/29/2012  . Obesity (BMI 30-39.9) 07/29/2012    Kerin Perna, PTA 11/06/2014  10:06 AM  [facility] [place], [place], [address] Phone: [phone]   Fax:  [phone]

## 2014-11-09 ENCOUNTER — Ambulatory Visit (INDEPENDENT_AMBULATORY_CARE_PROVIDER_SITE_OTHER): Payer: PRIVATE HEALTH INSURANCE | Admitting: Physical Therapy

## 2014-11-09 DIAGNOSIS — M25561 Pain in right knee: Secondary | ICD-10-CM | POA: Diagnosis not present

## 2014-11-09 DIAGNOSIS — M25661 Stiffness of right knee, not elsewhere classified: Secondary | ICD-10-CM | POA: Diagnosis not present

## 2014-11-09 DIAGNOSIS — M7989 Other specified soft tissue disorders: Secondary | ICD-10-CM | POA: Diagnosis not present

## 2014-11-09 DIAGNOSIS — R269 Unspecified abnormalities of gait and mobility: Secondary | ICD-10-CM

## 2014-11-09 DIAGNOSIS — M6281 Muscle weakness (generalized): Secondary | ICD-10-CM

## 2014-11-09 NOTE — Therapy (Signed)
[place], [place], [address] Phone: [phone]   Fax:  [phone]  Physical Therapy Treatment  Patient Details  Name: Jim Hunter MRN: [id number] Date of Birth: [birth year] Referring Provider:  Paralee Cancel, MD  Encounter Date: 11/09/2014      PT End of Session - 11/09/14 0854    Visit Number 10   Number of Visits 12   Date for PT Re-Evaluation 11/17/14   PT Start Time 0850   PT Stop Time 0941   PT Time Calculation (min) 51 min      Past Medical History  Diagnosis Date  . Hypertension   . Hyperlipidemia   . Arthritis   . Sleep apnea 3 years ago    no borderline no cpap needed per sleep studty done  . GERD (gastroesophageal reflux disease)   . PPD screening test last 9 years ago first started as child    treated as child for positive ppd test    Past Surgical History  Procedure Laterality Date  . Rotator cuff repair  2010    left  . Lumbar disc surgery  2006  . Tympanoplasty      age 49  . Total knee arthroplasty Right 09/29/2014    Procedure: RIGHT TOTAL KNEE ARTHROPLASTY;  Surgeon: Paralee Cancel, MD;  Location: [facility];  Service: Orthopedics;  Laterality: Right;    There were no vitals filed for this visit.  Visit Diagnosis:  Stiffness of knee joint, right  Pain in knee joint, right  Swelling of limb  Abnormality of gait  Muscle weakness (generalized)      Subjective Assessment - 11/09/14 0854    Subjective Pt reported tape helped his Rt knee over weekend, but wasn't able to rub it in same way to decrease pain. Pt's wife massaging Rt calf/hamstring; still sore.    Pain Score 4    Pain Location Knee   Pain Orientation Right   Pain Descriptors / Indicators Aching;Spasm   Aggravating Factors  moving knee into certain positions    Pain Relieving Factors medicine, ice, and rest             [facility] PT Assessment - 11/09/14 0001    Assessment   Medical Diagnosis Rt  TKR   Onset Date 09/29/14   AROM   AROM Assessment Site Knee   Right/Left Knee Right   Right Knee Extension -11   Right Knee Flexion 120                     [facility] Adult PT Treatment/Exercise - 11/09/14 0001    Knee/Hip Exercises: Stretches   Gastroc Stretch 1 rep;60 seconds   Knee/Hip Exercises: Aerobic   Stationary Bike L3 x 5 min   Knee/Hip Exercises: Machines for Strengthening   Total Gym Leg Press 6 plates RLE only x 10  X 2 sets  VC to slow down   Knee/Hip Exercises: Standing   Heel Raises 2 sets  RLE only   SLS with Vectors weighted ball toss x 10 angled R/L (R SLS) then same standing on blue foam    Other Standing Knee Exercises forward lunges on 13" step followed by hamstring stretch x 10 reps.    Other Standing Knee Exercises 20# box lift eye level to ground x 5 reps, 30# eye to floor level x 2 reps    Modalities   Modalities Electrical Stimulation;Cryotherapy   Cryotherapy   Number Minutes Cryotherapy 15  Minutes   Cryotherapy Location Knee   Type of Cryotherapy --  vaso, 3*, med pressure   Electrical Stimulation   Electrical Stimulation Location Rt knee    Electrical Stimulation Action ion repelling    Electrical Stimulation Parameters to tolerance    Electrical Stimulation Goals Edema;Pain                  PT Short Term Goals - 11/02/14 0854    PT SHORT TERM GOAL #1   Title I with Knee ROM exercise   Time 2   Period Weeks   Status Achieved   PT SHORT TERM GOAL #2   Title increase Rt knee extension to -8 degrees   Time 2   Period Weeks   Status On-going   PT SHORT TERM GOAL #3   Title increase Rt knee flexion 115 degrees   Time 2   Period Weeks   Status Partially Met   PT SHORT TERM GOAL #4   Title ambulate without assistive device on even surfaces   Time 2   Period Weeks   Status Achieved   PT SHORT TERM GOAL #5   Title reduce pain =/> 50% with dialy activity   Time 2   Period Weeks   Status Achieved           PT  Long Term Goals - 10/23/14 8756    PT LONG TERM GOAL #1   Title I with advanced HEP   Time 4   Period Weeks   Status On-going   PT LONG TERM GOAL #2   Title ambulate on even/uneven surfaces and stairs with normalized gait   Time 4   Period Weeks   Status On-going   PT LONG TERM GOAL #3   Title increased strength Rt hip =/> 5/5   Time 4   Period Weeks   Status On-going   PT LONG TERM GOAL #4   Title demo Rt knee ROM WFL    Time 4   Period Weeks   Status On-going   PT LONG TERM GOAL #5   Title improve FOTO =/< 47% limited   Time 4   Period Weeks   Status On-going               Plan - 11/09/14 0927    Clinical Impression Statement Pt demo improved Rt knee flexion, continue lack of full extension (lacking 11 degrees).  Pt tolerated UE lifts of up to 30# with mild pain in Rt knee during squat.     Pt will benefit from skilled therapeutic intervention in order to improve on the following deficits Abnormal gait;Decreased range of motion;Difficulty walking;Impaired flexibility;Decreased skin integrity;Pain;Decreased mobility;Decreased strength;Decreased balance   Rehab Potential Excellent   PT Frequency 3x / week   PT Duration 4 weeks   PT Next Visit Plan Continue ROM and progressive strengthening for Rt knee. Advance HEP. Continue weighted UE lifts for return to work.   Consulted and Agree with Plan of Care Patient        Problem List Patient Active Problem List   Diagnosis Date Noted  . Obese 09/30/2014  . S/P right TKA 09/29/2014  . S/P knee replacement 09/29/2014  . Excessive cerumen in ear canal 09/02/2014  . Alcohol use 07/06/2014  . Osteoarthritis of right knee 05/26/2014  . Erectile dysfunction 05/26/2014  . Numbness and tingling in left arm 07/02/2013  . Sensation of fullness in left ear 05/02/2013  . Back pain 05/02/2013  .  GERD (gastroesophageal reflux disease) 10/02/2012  . Umbilical hernia 58/34/6219  . Hypertension 07/29/2012  . Hyperlipidemia  07/29/2012  . Obesity (BMI 30-39.9) 07/29/2012   Kerin Perna, PTA 11/09/2014 12:47 PM  [place], [place], [address] Phone: [phone]   Fax:  [phone]

## 2014-11-11 ENCOUNTER — Ambulatory Visit (INDEPENDENT_AMBULATORY_CARE_PROVIDER_SITE_OTHER): Payer: PRIVATE HEALTH INSURANCE | Admitting: Physical Therapy

## 2014-11-11 DIAGNOSIS — M7989 Other specified soft tissue disorders: Secondary | ICD-10-CM | POA: Diagnosis not present

## 2014-11-11 DIAGNOSIS — M6281 Muscle weakness (generalized): Secondary | ICD-10-CM | POA: Diagnosis not present

## 2014-11-11 DIAGNOSIS — M25561 Pain in right knee: Secondary | ICD-10-CM

## 2014-11-11 DIAGNOSIS — M25661 Stiffness of right knee, not elsewhere classified: Secondary | ICD-10-CM | POA: Diagnosis not present

## 2014-11-11 NOTE — Therapy (Signed)
[place], [place], [address] Phone: [phone]   Fax:  [phone]  Physical Therapy Treatment  Patient Details  Name: Jim Hunter MRN: [id number] Date of Birth: [birth year] Referring Provider:  Leone Brand, MD  Encounter Date: 11/11/2014      PT End of Session - 11/11/14 0840    Visit Number 11   Number of Visits 12   Date for PT Re-Evaluation 11/17/14   PT Start Time 0839   PT Stop Time 0959   PT Time Calculation (min) 80 min   Activity Tolerance Patient tolerated treatment well      Past Medical History  Diagnosis Date  . Hypertension   . Hyperlipidemia   . Arthritis   . Sleep apnea 3 years ago    no borderline no cpap needed per sleep studty done  . GERD (gastroesophageal reflux disease)   . PPD screening test last 9 years ago first started as child    treated as child for positive ppd test    Past Surgical History  Procedure Laterality Date  . Rotator cuff repair  2010    left  . Lumbar disc surgery  2006  . Tympanoplasty      age 46  . Total knee arthroplasty Right 09/29/2014    Procedure: RIGHT TOTAL KNEE ARTHROPLASTY;  Surgeon: Paralee Cancel, MD;  Location: [facility];  Service: Orthopedics;  Laterality: Right;    There were no vitals filed for this visit.  Visit Diagnosis:  Stiffness of knee joint, right  Pain in knee joint, right  Swelling of limb  Muscle weakness (generalized)      Subjective Assessment - 11/11/14 0857    Subjective Pt doing well with activity for his knee, however starting to have some Lt side buttock pain with repetitive lifting    Currently in Pain? Yes   Pain Score 3    Pain Location Knee   Pain Orientation Right   Pain Descriptors / Indicators Dull;Stabbing   Pain Type Surgical pain   Pain Frequency Constant                         [facility] Adult PT Treatment/Exercise - 11/11/14 0001    Knee/Hip Exercises: Aerobic   Stationary Bike L5x5'   Knee/Hip Exercises: Standing   Wall Squat 2 sets;15 reps   Other Standing Knee Exercises sled push/pull sled with 70# on it ( 50#oF)   Other Standing Knee Exercises lifting 20# OH to/from waist, then floor to/from waist x 10.    Knee/Hip Exercises: Supine   Bridges 2 sets;10 reps   Other Supine Knee Exercises SKTC x 30 sec, then LTR x 10   Knee/Hip Exercises: Prone   Other Prone Exercises 5x10 sec plank on elbows.    Modalities   Modalities Electrical Stimulation;Ultrasound;Cryotherapy   Cryotherapy   Number Minutes Cryotherapy 15 Minutes   Cryotherapy Location Knee  Rt   Type of Cryotherapy --  vaso, med pressure 3*   Electrical Stimulation   Electrical Stimulation Location Rt knee    Electrical Stimulation Action ion repelling   Electrical Stimulation Parameters to tolerance   Electrical Stimulation Goals Edema   Ultrasound   Ultrasound Location Rt knee   Ultrasound Parameters 100%, 3.3 Mhz, 1.5 w/cm2   Ultrasound Goals Pain  muslce tightness   Manual Therapy   Manual Therapy Myofascial release   Myofascial Release STW to posterior Rt knee with passive  stretching.                   PT Short Term Goals - 11/02/14 0854    PT SHORT TERM GOAL #1   Title I with Knee ROM exercise   Time 2   Period Weeks   Status Achieved   PT SHORT TERM GOAL #2   Title increase Rt knee extension to -8 degrees   Time 2   Period Weeks   Status On-going   PT SHORT TERM GOAL #3   Title increase Rt knee flexion 115 degrees   Time 2   Period Weeks   Status Partially Met   PT SHORT TERM GOAL #4   Title ambulate without assistive device on even surfaces   Time 2   Period Weeks   Status Achieved   PT SHORT TERM GOAL #5   Title reduce pain =/> 50% with dialy activity   Time 2   Period Weeks   Status Achieved           PT Long Term Goals - 10/23/14 5093    PT LONG TERM GOAL #1   Title I with advanced HEP   Time 4   Period Weeks   Status  On-going   PT LONG TERM GOAL #2   Title ambulate on even/uneven surfaces and stairs with normalized gait   Time 4   Period Weeks   Status On-going   PT LONG TERM GOAL #3   Title increased strength Rt hip =/> 5/5   Time 4   Period Weeks   Status On-going   PT LONG TERM GOAL #4   Title demo Rt knee ROM WFL    Time 4   Period Weeks   Status On-going   PT LONG TERM GOAL #5   Title improve FOTO =/< 47% limited   Time 4   Period Weeks   Status On-going               Plan - 11/11/14 0936    Clinical Impression Statement Pt continues to have limited Rt knee extension, should respond well with the JAZZ brace to work on this.  He is doing well with functional tasks of lifting up to 20#.    Pt will benefit from skilled therapeutic intervention in order to improve on the following deficits Abnormal gait;Decreased range of motion;Difficulty walking;Impaired flexibility;Decreased skin integrity;Pain;Decreased mobility;Decreased strength;Decreased balance   Rehab Potential Excellent   PT Frequency 3x / week   PT Duration 4 weeks   PT Treatment/Interventions Moist Heat;Therapeutic activities;Patient/family education;Scar mobilization;Passive range of motion;Therapeutic exercise;DME Instruction;Ultrasound;Gait training;Balance training;Manual techniques;Neuromuscular re-education;Stair training;Cryotherapy;Electrical Stimulation   PT Next Visit Plan assess use of brace   Consulted and Agree with Plan of Care Patient        Problem List Patient Active Problem List   Diagnosis Date Noted  . Obese 09/30/2014  . S/P right TKA 09/29/2014  . S/P knee replacement 09/29/2014  . Excessive cerumen in ear canal 09/02/2014  . Alcohol use 07/06/2014  . Osteoarthritis of right knee 05/26/2014  . Erectile dysfunction 05/26/2014  . Numbness and tingling in left arm 07/02/2013  . Sensation of fullness in left ear 05/02/2013  . Back pain 05/02/2013  . GERD (gastroesophageal reflux disease)  10/02/2012  . Umbilical hernia 26/71/2458  . Hypertension 07/29/2012  . Hyperlipidemia 07/29/2012  . Obesity (BMI 30-39.9) 07/29/2012    Jeral Pinch, PT 11/11/2014, 9:39 AM  [phone]  [place], [place], [address] Phone: [phone]   Fax:  [phone]

## 2014-11-11 NOTE — Patient Instructions (Signed)
Jazz rep came and instructed in use of the brace, 3 x 30 min a day to work on extension, should feel gentle stretch with pain ~ 3/10. Push lever towards extension

## 2014-11-13 ENCOUNTER — Ambulatory Visit (INDEPENDENT_AMBULATORY_CARE_PROVIDER_SITE_OTHER): Payer: PRIVATE HEALTH INSURANCE | Admitting: Physical Therapy

## 2014-11-13 DIAGNOSIS — M25561 Pain in right knee: Secondary | ICD-10-CM | POA: Diagnosis not present

## 2014-11-13 DIAGNOSIS — M7989 Other specified soft tissue disorders: Secondary | ICD-10-CM | POA: Diagnosis not present

## 2014-11-13 DIAGNOSIS — M6281 Muscle weakness (generalized): Secondary | ICD-10-CM | POA: Diagnosis not present

## 2014-11-13 DIAGNOSIS — M25661 Stiffness of right knee, not elsewhere classified: Secondary | ICD-10-CM | POA: Diagnosis not present

## 2014-11-13 DIAGNOSIS — R269 Unspecified abnormalities of gait and mobility: Secondary | ICD-10-CM

## 2014-11-13 NOTE — Therapy (Signed)
[place], [place], [address] Phone: [phone]   Fax:  [phone]  Physical Therapy Treatment  Patient Details  Name: Jim Hunter MRN: [id number] Date of Birth: 1966/06/25 Referring Provider:  Paralee Cancel, MD  Encounter Date: 11/13/2014      PT End of Session - 11/13/14 0847    Visit Number 12   Number of Visits 20   Date for PT Re-Evaluation 12/11/14   PT Start Time 0840   PT Stop Time 0940   PT Time Calculation (min) 60 min   Activity Tolerance Patient tolerated treatment well      Past Medical History  Diagnosis Date  . Hypertension   . Hyperlipidemia   . Arthritis   . Sleep apnea 3 years ago    no borderline no cpap needed per sleep studty done  . GERD (gastroesophageal reflux disease)   . PPD screening test last 9 years ago first started as child    treated as child for positive ppd test    Past Surgical History  Procedure Laterality Date  . Rotator cuff repair  2010    left  . Lumbar disc surgery  2006  . Tympanoplasty      age 55  . Total knee arthroplasty Right 09/29/2014    Procedure: RIGHT TOTAL KNEE ARTHROPLASTY;  Surgeon: Paralee Cancel, MD;  Location: [facility];  Service: Orthopedics;  Laterality: Right;    There were no vitals filed for this visit.  Visit Diagnosis:  Stiffness of knee joint, right  Pain in knee joint, right  Swelling of limb  Muscle weakness (generalized)  Abnormality of gait      Subjective Assessment - 11/13/14 0842    Currently in Pain? Yes   Pain Score 4    Pain Location Knee   Pain Orientation Right   Pain Descriptors / Indicators Dull            [facility] PT Assessment - 11/13/14 0001    Assessment   Medical Diagnosis Rt TKR   Onset Date 09/29/14   AROM   AROM Assessment Site Knee   Right/Left Knee Right   Right Knee Extension -9   Right Knee Flexion 119   Strength   Strength Assessment Site Knee;Hip   Right/Left Hip  Right   Right Hip Flexion 5/5   Right Hip Extension 5/5   Right Hip ABduction 5/5   Right/Left Knee Right   Right Knee Flexion 5/5   Right Knee Extension 5/5                     [facility] Adult PT Treatment/Exercise - 11/13/14 0001    Ambulation/Gait   Ambulation/Gait Yes   Ambulation/Gait Assistance 7: Independent   Ambulation Distance (Feet) 200 Feet   Assistive device None   Gait Pattern Step-through pattern;Decreased step length - left;Antalgic   Ambulation Surface Unlevel;Outdoor;Grass;Gravel   Knee/Hip Exercises: Standing   Heel Raises 2 sets  and 2 sets with RLE only   Heel Raises Limitations R heel raises 1" less than Lt.    SLS on Trampoline with ball toss 30 sec x 4 reps.    Other Standing Knee Exercises sled push/pull sled with 70# on it ( 50#oF), x 150 ft.    Other Standing Knee Exercises lifting 30# box waist to floor x 4 reps    Knee/Hip Exercises: Prone   Other Prone Exercises Prone TKE with 10 sec hold x 10.  Modalities   Modalities Electrical Stimulation;Cryotherapy   Cryotherapy   Number Minutes Cryotherapy 15 Minutes   Cryotherapy Location Knee   Type of Cryotherapy --  vaso, med pressure    Electrical Stimulation   Electrical Stimulation Location Rt knee    Electrical Stimulation Action ion repelling x 15 min    Electrical Stimulation Parameters to tolerance    Electrical Stimulation Goals Edema;Pain                PT Education - 11/13/14 1321    Education provided Yes   Education Details HEP - instructed to do single leg heel raises to current HEP   Person(s) Educated Patient   Methods Explanation   Comprehension Verbalized understanding;Returned demonstration          PT Short Term Goals - 11/02/14 0854    PT SHORT TERM GOAL #1   Title I with Knee ROM exercise   Time 2   Period Weeks   Status Achieved   PT SHORT TERM GOAL #2   Title increase Rt knee extension to -8 degrees   Time 2   Period Weeks   Status On-going    PT SHORT TERM GOAL #3   Title increase Rt knee flexion 115 degrees   Time 2   Period Weeks   Status Partially Met   PT SHORT TERM GOAL #4   Title ambulate without assistive device on even surfaces   Time 2   Period Weeks   Status Achieved   PT SHORT TERM GOAL #5   Title reduce pain =/> 50% with dialy activity   Time 2   Period Weeks   Status Achieved           PT Long Term Goals - 11/13/14 1318    PT LONG TERM GOAL #1   Title I with advanced HEP   Time 4   Period Weeks   Status On-going   PT LONG TERM GOAL #2   Title ambulate on even/uneven surfaces and stairs with normalized gait   Time 4   Period Weeks   Status Partially Met   PT LONG TERM GOAL #3   Title increased strength Rt hip =/> 5/5   Time 4   Period Weeks   Status Achieved   PT LONG TERM GOAL #4   Title demo Rt knee ROM WFL    Time 4   Period Weeks   Status On-going   PT LONG TERM GOAL #5   Title improve FOTO =/< 47% limited   Time 4   Period Weeks   Status On-going   PT LONG TERM GOAL #6   Title lift 50# floor to waist level without trouble   Time 4   Period Weeks   Status On-going               Plan - 11/13/14 1313    Clinical Impression Statement Pt demonstrated slight improvement in Rt knee extension this visit, while still lacking 8 degrees. Pt continues with antalgic gait, especially on uneven surfaces/ ramps. Pt continues with decreased Rt quad eccentric control with functional tasks. pt has met LTG #3.     Pt will benefit from skilled therapeutic intervention in order to improve on the following deficits Abnormal gait;Decreased range of motion;Difficulty walking;Impaired flexibility;Decreased skin integrity;Pain;Decreased mobility;Decreased strength;Decreased balance   Rehab Potential Excellent   PT Treatment/Interventions Moist Heat;Therapeutic activities;Patient/family education;Scar mobilization;Passive range of motion;Therapeutic exercise;DME Instruction;Ultrasound;Gait  training;Balance training;Manual techniques;Neuromuscular re-education;Stair training;Cryotherapy;Printmaker  PT Next Visit Plan Spoke to supervising PT regarding pt's progress and his desire to continue therapy to improve functional mobility independence.    Consulted and Agree with Plan of Care Patient        Problem List Patient Active Problem List   Diagnosis Date Noted  . Obese 09/30/2014  . S/P right TKA 09/29/2014  . S/P knee replacement 09/29/2014  . Excessive cerumen in ear canal 09/02/2014  . Alcohol use 07/06/2014  . Osteoarthritis of right knee 05/26/2014  . Erectile dysfunction 05/26/2014  . Numbness and tingling in left arm 07/02/2013  . Sensation of fullness in left ear 05/02/2013  . Back pain 05/02/2013  . GERD (gastroesophageal reflux disease) 10/02/2012  . Umbilical hernia 04/59/9774  . Hypertension 07/29/2012  . Hyperlipidemia 07/29/2012  . Obesity (BMI 30-39.9) 07/29/2012   Kerin Perna, PTA 11/13/2014 1:23 PM  [facility] [place], [place], [address] Phone: [phone]   Fax:  [phone]    Madelyn Flavors, PT 11/13/2014 1:38 PM  [facility] [place], [place] [address]  [phone] (office) [phone] (fax)

## 2014-11-16 ENCOUNTER — Encounter: Payer: Self-pay | Admitting: Physical Therapy

## 2014-11-18 ENCOUNTER — Ambulatory Visit (INDEPENDENT_AMBULATORY_CARE_PROVIDER_SITE_OTHER): Payer: PRIVATE HEALTH INSURANCE | Admitting: Physical Therapy

## 2014-11-18 DIAGNOSIS — M6281 Muscle weakness (generalized): Secondary | ICD-10-CM | POA: Diagnosis not present

## 2014-11-18 DIAGNOSIS — M7989 Other specified soft tissue disorders: Secondary | ICD-10-CM

## 2014-11-18 DIAGNOSIS — M25561 Pain in right knee: Secondary | ICD-10-CM

## 2014-11-18 DIAGNOSIS — M25661 Stiffness of right knee, not elsewhere classified: Secondary | ICD-10-CM | POA: Diagnosis not present

## 2014-11-18 NOTE — Patient Instructions (Signed)
Knee Extension: Quadriceps Single Leg Minisquat (Eccentric),   Single leg sit to stand) Stand on affected leg , holding support as needed. Slowly bend knee for 3-5 seconds. Return to starting position by extending affected knee, then standing on affected leg. __8_ reps per set, __2_ sets per day, _3__ days per week. .  Outer Hip Stretch: Reclined IT Band Stretch (Strap)   Strap around opposite foot, pull across only as far as possible with shoulders on mat. Hold for __5__ breaths. Repeat _5___ times each leg.  Copyright  VHI. All rights reserved.   [facility] at [facility] [place], [place] [address]  [phone] (office) [phone] (fax)

## 2014-11-18 NOTE — Therapy (Signed)
[facility] [place], [place], [address] Phone: [phone]   Fax:  [phone]  Physical Therapy Treatment  Patient Details  Name: Jim Hunter MRN: [id number] Date of Birth: 1966/01/31 Referring Provider:  Paralee Cancel, MD  Encounter Date: 11/18/2014      PT End of Session - 11/18/14 0848    Visit Number 13   Number of Visits 20   Date for PT Re-Evaluation 12/11/14   PT Start Time 0845   PT Stop Time 0940   PT Time Calculation (min) 55 min      Past Medical History  Diagnosis Date  . Hypertension   . Hyperlipidemia   . Arthritis   . Sleep apnea 3 years ago    no borderline no cpap needed per sleep studty done  . GERD (gastroesophageal reflux disease)   . PPD screening test last 9 years ago first started as child    treated as child for positive ppd test    Past Surgical History  Procedure Laterality Date  . Rotator cuff repair  2010    left  . Lumbar disc surgery  2006  . Tympanoplasty      age 48  . Total knee arthroplasty Right 09/29/2014    Procedure: RIGHT TOTAL KNEE ARTHROPLASTY;  Surgeon: Paralee Cancel, MD;  Location: [facility];  Service: Orthopedics;  Laterality: Right;    There were no vitals filed for this visit.  Visit Diagnosis:  Stiffness of knee joint, right  Pain in knee joint, right  Swelling of limb  Muscle weakness (generalized)      Subjective Assessment - 11/18/14 0849    Subjective Pt reported he is having difficulty with extension brace; has been in contact with rep.  Pt reported Lt knee "gave way" when walking in yard.  Taking pain medicine at night, changes in weather, and before therapy sessions.    Currently in Pain? Yes   Pain Score 2    Pain Location Knee   Pain Orientation Right   Pain Descriptors / Indicators Burning   Aggravating Factors  Moving knee into certain positions   Pain Relieving Factors medicine, ice, rest             [facility] PT Assessment  - 11/18/14 0001    Assessment   Medical Diagnosis Rt TKR   Onset Date 09/29/14   AROM   AROM Assessment Site Knee   Right/Left Knee Right   Right Knee Extension -8   Right Knee Flexion 118                     [facility] Adult PT Treatment/Exercise - 11/18/14 0001    Knee/Hip Exercises: Stretches   Passive Hamstring Stretch 2 reps;30 seconds   Quad Stretch 2 reps;30 seconds   ITB Stretch 2 reps;30 seconds   Gastroc Stretch 2 reps;30 seconds   Soleus Stretch 2 reps;30 seconds   Knee/Hip Exercises: Aerobic   Stationary Bike L3 x 5 min    Knee/Hip Exercises: Standing   Heel Raises 10 reps;3 sets  RLE only   Step Down Right;10 reps  Heel taps, 6" step.    Wall Squat 5 reps  single leg, poor form. Stopped.    Other Standing Knee Exercises Single leg squats RLE to low mat table (with 3"step on table) x 12 (very challenging)    Other Standing Knee Exercises Box 45# lift floor to waist x 5 reps    Modalities  Modalities Electrical Stimulation;Cryotherapy   Cryotherapy   Number Minutes Cryotherapy 15 Minutes   Cryotherapy Location Knee   Type of Cryotherapy --  vaso, med pressure, 3*   Electrical Stimulation   Electrical Stimulation Location Rt knee    Electrical Stimulation Action ion repelling    Electrical Stimulation Parameters to tolerance x 15 min    Electrical Stimulation Goals Edema;Pain                PT Education - 11/18/14 [phone]    Education provided Yes   Education Details HEP    Person(s) Educated Patient   Methods Explanation;Handout   Comprehension Verbalized understanding          PT Short Term Goals - 11/18/14 1233    PT SHORT TERM GOAL #1   Title I with Knee ROM exercise   Time 2   Period Weeks   Status Achieved   PT SHORT TERM GOAL #2   Title increase Rt knee extension to -8 degrees   Time 2   Period Weeks   Status Achieved   PT SHORT TERM GOAL #3   Title increase Rt knee flexion 115 degrees   Time 2   Period Weeks    Status Achieved   PT SHORT TERM GOAL #4   Title ambulate without assistive device on even surfaces   Time 2   Period Weeks   Status Achieved   PT SHORT TERM GOAL #5   Title reduce pain =/> 50% with dialy activity   Time 2   Period Weeks   Status Achieved           PT Long Term Goals - 11/13/14 1318    PT LONG TERM GOAL #1   Title I with advanced HEP   Time 4   Period Weeks   Status On-going   PT LONG TERM GOAL #2   Title ambulate on even/uneven surfaces and stairs with normalized gait   Time 4   Period Weeks   Status Partially Met   PT LONG TERM GOAL #3   Title increased strength Rt hip =/> 5/5   Time 4   Period Weeks   Status Achieved   PT LONG TERM GOAL #4   Title demo Rt knee ROM WFL    Time 4   Period Weeks   Status On-going   PT LONG TERM GOAL #5   Title improve FOTO =/< 47% limited   Time 4   Period Weeks   Status On-going   PT LONG TERM GOAL #6   Title lift 50# floor to waist level without trouble   Time 4   Period Weeks   Status On-going               Plan - 11/18/14 0939    Clinical Impression Statement Pt gained 1 degree of Rt knee extension meeting STG #2 and #3.  Pt demonstrated difficulty with Rt eccentric quad strengthening exercises; LE became tremulous and fatigued quickly with single leg squats and heels taps. Pt had one episode of Rt knee buckling after box lift.  Progressing towards all goals.      Pt will benefit from skilled therapeutic intervention in order to improve on the following deficits Abnormal gait;Decreased range of motion;Difficulty walking;Impaired flexibility;Decreased skin integrity;Pain;Decreased mobility;Decreased strength;Decreased balance   Rehab Potential Excellent   PT Frequency 2x / week   PT Duration 4 weeks   PT Treatment/Interventions Moist Heat;Therapeutic activities;Patient/family education;Scar mobilization;Passive range of motion;Therapeutic exercise;DME  Instruction;Ultrasound;Gait training;Balance  training;Manual techniques;Neuromuscular re-education;Stair training;Cryotherapy;Electrical Stimulation   PT Next Visit Plan Continue eccentric strengthening of RLE and ROM for Rt knee.    Consulted and Agree with Plan of Care Patient        Problem List Patient Active Problem List   Diagnosis Date Noted  . Obese 09/30/2014  . S/P right TKA 09/29/2014  . S/P knee replacement 09/29/2014  . Excessive cerumen in ear canal 09/02/2014  . Alcohol use 07/06/2014  . Osteoarthritis of right knee 05/26/2014  . Erectile dysfunction 05/26/2014  . Numbness and tingling in left arm 07/02/2013  . Sensation of fullness in left ear 05/02/2013  . Back pain 05/02/2013  . GERD (gastroesophageal reflux disease) 10/02/2012  . Umbilical hernia 23/76/2831  . Hypertension 07/29/2012  . Hyperlipidemia 07/29/2012  . Obesity (BMI 30-39.9) 07/29/2012    Kerin Perna, PTA 11/18/2014 12:34 PM  [place], [place], [address] Phone: [phone]   Fax:  [phone]

## 2014-11-20 ENCOUNTER — Ambulatory Visit (INDEPENDENT_AMBULATORY_CARE_PROVIDER_SITE_OTHER): Payer: PRIVATE HEALTH INSURANCE | Admitting: Physical Therapy

## 2014-11-20 DIAGNOSIS — M25661 Stiffness of right knee, not elsewhere classified: Secondary | ICD-10-CM

## 2014-11-20 DIAGNOSIS — M7989 Other specified soft tissue disorders: Secondary | ICD-10-CM

## 2014-11-20 DIAGNOSIS — M6281 Muscle weakness (generalized): Secondary | ICD-10-CM | POA: Diagnosis not present

## 2014-11-20 DIAGNOSIS — R269 Unspecified abnormalities of gait and mobility: Secondary | ICD-10-CM

## 2014-11-20 DIAGNOSIS — M25561 Pain in right knee: Secondary | ICD-10-CM | POA: Diagnosis not present

## 2014-11-20 NOTE — Therapy (Signed)
[facility], [place], [address] Phone: [phone]   Fax:  [phone]  Physical Therapy Treatment  Patient Details  Name: Jim Hunter MRN: [id number] Date of Birth: June 27, 1966 Referring Provider:  Paralee Cancel, MD  Encounter Date: 11/20/2014      PT End of Session - 11/20/14 0847    Visit Number 14   Number of Visits 20   Date for PT Re-Evaluation 12/11/14   PT Start Time 0845   PT Stop Time 0939   PT Time Calculation (min) 54 min   Activity Tolerance Patient tolerated treatment well      Past Medical History  Diagnosis Date  . Hypertension   . Hyperlipidemia   . Arthritis   . Sleep apnea 3 years ago    no borderline no cpap needed per sleep studty done  . GERD (gastroesophageal reflux disease)   . PPD screening test last 9 years ago first started as child    treated as child for positive ppd test    Past Surgical History  Procedure Laterality Date  . Rotator cuff repair  2010    left  . Lumbar disc surgery  2006  . Tympanoplasty      age 49  . Total knee arthroplasty Right 09/29/2014    Procedure: RIGHT TOTAL KNEE ARTHROPLASTY;  Surgeon: Paralee Cancel, MD;  Location: [facility];  Service: Orthopedics;  Laterality: Right;    There were no vitals filed for this visit.  Visit Diagnosis:  Pain in knee joint, right  Stiffness of knee joint, right  Swelling of limb  Muscle weakness (generalized)  Abnormality of gait      Subjective Assessment - 11/20/14 0849    Subjective Pt reports he had pain in Rt buttock from exercise during last therapy. Clicking in Rt knee bothering him, as well as his knee "heating up" with exercise.    Currently in Pain? Yes   Pain Score 4    Pain Location Knee   Pain Orientation Right   Pain Descriptors / Indicators Burning            [facility] PT Assessment - 11/20/14 0001    Assessment   Medical Diagnosis Rt TKR   Onset Date 09/29/14                      [facility] Adult PT Treatment/Exercise - 11/20/14 0001    Knee/Hip Exercises: Stretches   Passive Hamstring Stretch 2 reps;30 seconds   Quad Stretch 2 reps;30 seconds   ITB Stretch 2 reps;30 seconds   Gastroc Stretch 2 reps;20 seconds   Soleus Stretch 20 seconds;2 reps   Knee/Hip Exercises: Aerobic   Stationary Bike L4 x 5 min    Tread Mill Decline@ speed of 2.0 working on even step pattern    Knee/Hip Exercises: Machines for Strengthening   Cybex Knee Extension 2 plates with RLE x 10 reps x 2 sets, slow descent   Knee/Hip Exercises: Standing   Heel Raises 2 sets;15 reps   Step Down Right;10 reps  Heel taps, 6" step.    Other Standing Knee Exercises sled push/pull with 90# x 150 ft; single leg squat to mat table (with 3.5"in step) x 10 reps    Other Standing Knee Exercises Box lift 50# eye level to floor x 2 reps; waist to floor x 2 reps    Modalities   Modalities Electrical Stimulation;Cryotherapy   Cryotherapy   Number Minutes Cryotherapy  15 Minutes   Cryotherapy Location Knee   Type of Cryotherapy --  vaso, med, 3*   Acupuncturist Location Rt knee    Electrical Stimulation Action ion repelling    Electrical Stimulation Parameters to tolerance x 15 min    Electrical Stimulation Goals Edema;Pain                  PT Short Term Goals - 11/18/14 1233    PT SHORT TERM GOAL #1   Title I with Knee ROM exercise   Time 2   Period Weeks   Status Achieved   PT SHORT TERM GOAL #2   Title increase Rt knee extension to -8 degrees   Time 2   Period Weeks   Status Achieved   PT SHORT TERM GOAL #3   Title increase Rt knee flexion 115 degrees   Time 2   Period Weeks   Status Achieved   PT SHORT TERM GOAL #4   Title ambulate without assistive device on even surfaces   Time 2   Period Weeks   Status Achieved   PT SHORT TERM GOAL #5   Title reduce pain =/> 50% with dialy activity   Time 2   Period Weeks    Status Achieved           PT Long Term Goals - 11/20/14 0569    PT LONG TERM GOAL #1   Title I with advanced HEP   Time 4   Period Weeks   Status On-going   PT LONG TERM GOAL #2   Title ambulate on even/uneven surfaces and stairs with normalized gait   Time 4   Period Weeks   Status Partially Met   PT LONG TERM GOAL #3   Title increased strength Rt hip =/> 5/5   Time 4   Period Weeks   Status Achieved   PT LONG TERM GOAL #4   Title demo Rt knee ROM WFL    Time 4   Period Weeks   Status Partially Met   PT LONG TERM GOAL #5   Title improve FOTO =/< 47% limited   Time 4   Period Weeks   Status On-going   PT LONG TERM GOAL #6   Title lift 50# floor to waist level without trouble   Time 4   Period Weeks   Status Achieved               Plan - 11/20/14 0926    Clinical Impression Statement Pt demonstrated improved Rt knee extension (lacking 6 degrees).  Pt tolerated increased resistance with box lift and Rt knee exercises with minimal increase in pain.  Pt has partially met LTG 4 and met LTG 6.    Pt will benefit from skilled therapeutic intervention in order to improve on the following deficits Abnormal gait;Decreased range of motion;Difficulty walking;Impaired flexibility;Decreased skin integrity;Pain;Decreased mobility;Decreased strength;Decreased balance   Rehab Potential Excellent   PT Frequency 2x / week   PT Duration 4 weeks   PT Treatment/Interventions Moist Heat;Therapeutic activities;Patient/family education;Scar mobilization;Passive range of motion;Therapeutic exercise;DME Instruction;Ultrasound;Gait training;Balance training;Manual techniques;Neuromuscular re-education;Stair training;Cryotherapy;Electrical Stimulation   PT Next Visit Plan Continue eccentric strengthening of RLE and ROM for Rt knee.    Consulted and Agree with Plan of Care Patient        Problem List Patient Active Problem List   Diagnosis Date Noted  . Obese 09/30/2014  . S/P  right TKA 09/29/2014  . S/P  knee replacement 09/29/2014  . Excessive cerumen in ear canal 09/02/2014  . Alcohol use 07/06/2014  . Osteoarthritis of right knee 05/26/2014  . Erectile dysfunction 05/26/2014  . Numbness and tingling in left arm 07/02/2013  . Sensation of fullness in left ear 05/02/2013  . Back pain 05/02/2013  . GERD (gastroesophageal reflux disease) 10/02/2012  . Umbilical hernia 71/59/5396  . Hypertension 07/29/2012  . Hyperlipidemia 07/29/2012  . Obesity (BMI 30-39.9) 07/29/2012   Kerin Perna, PTA 11/20/2014 9:32 AM  [place], [place], [address] Phone: [phone]   Fax:  [phone]

## 2014-11-25 ENCOUNTER — Ambulatory Visit (INDEPENDENT_AMBULATORY_CARE_PROVIDER_SITE_OTHER): Payer: PRIVATE HEALTH INSURANCE | Admitting: Physical Therapy

## 2014-11-25 ENCOUNTER — Encounter: Payer: Self-pay | Admitting: Physical Therapy

## 2014-11-25 DIAGNOSIS — M7989 Other specified soft tissue disorders: Secondary | ICD-10-CM

## 2014-11-25 DIAGNOSIS — M25561 Pain in right knee: Secondary | ICD-10-CM | POA: Diagnosis not present

## 2014-11-25 DIAGNOSIS — M6281 Muscle weakness (generalized): Secondary | ICD-10-CM | POA: Diagnosis not present

## 2014-11-25 DIAGNOSIS — M25661 Stiffness of right knee, not elsewhere classified: Secondary | ICD-10-CM

## 2014-11-25 NOTE — Therapy (Signed)
[facility] [place], [place], [address] Phone: [phone]   Fax:  [phone]  Physical Therapy Treatment  Patient Details  Name: Jim Hunter MRN: [id number] Date of Birth: Jul 28, 1965 Referring Provider:  Paralee Cancel, MD  Encounter Date: 11/25/2014      PT End of Session - 11/25/14 0837    Visit Number 15   Number of Visits 20   Date for PT Re-Evaluation 12/11/14   PT Start Time 0841   PT Stop Time 0948   PT Time Calculation (min) 67 min      Past Medical History  Diagnosis Date  . Hypertension   . Hyperlipidemia   . Arthritis   . Sleep apnea 3 years ago    no borderline no cpap needed per sleep studty done  . GERD (gastroesophageal reflux disease)   . PPD screening test last 9 years ago first started as child    treated as child for positive ppd test    Past Surgical History  Procedure Laterality Date  . Rotator cuff repair  2010    left  . Lumbar disc surgery  2006  . Tympanoplasty      age 27  . Total knee arthroplasty Right 09/29/2014    Procedure: RIGHT TOTAL KNEE ARTHROPLASTY;  Surgeon: Paralee Cancel, MD;  Location: [facility];  Service: Orthopedics;  Laterality: Right;    There were no vitals filed for this visit.  Visit Diagnosis:  Pain in knee joint, right  Stiffness of knee joint, right  Swelling of limb  Muscle weakness (generalized)      Subjective Assessment - 11/25/14 0842    Subjective PT reports he is doing well, some pain in the knee area.  Thinks the clicking may be decreased   Currently in Pain? Yes   Pain Score 4    Pain Location Knee   Pain Orientation Right   Pain Type Surgical pain   Pain Radiating Towards 6/10 stinging in the lateral tibial plataue area   Pain Onset More than a month ago   Pain Frequency Constant   Aggravating Factors  certain movements   Pain Relieving Factors ice and rest            [facility] PT Assessment - 11/25/14 0001    Assessment   Medical Diagnosis Rt TKR   Onset Date 09/29/14   AROM   Right Knee Extension -2                     [facility] Adult PT Treatment/Exercise - 11/25/14 0001    Knee/Hip Exercises: Aerobic   Elliptical L3x 4'   Knee/Hip Exercises: Machines for Strengthening   Cybex Knee Extension 2 plates with RLE 3 x 10 reps x 2 sets, slow descent   Total Gym Leg Press 7 plates 3x10, Rt LE only   Knee/Hip Exercises: Supine   Quad Sets Right  30 reps with strap and HS stretch.    Knee/Hip Exercises: Prone   Hamstring Curl 3 sets;10 reps  Rt with 7.5#, increased to  10#   Modalities   Modalities Electrical Stimulation;Cryotherapy   Cryotherapy   Number Minutes Cryotherapy 15 Minutes   Cryotherapy Location Knee  Rt   Type of Cryotherapy --  vaso, high pressure 3*   Electrical Stimulation   Electrical Stimulation Location Rt knee    Electrical Stimulation Action ion repelling   Electrical Stimulation Parameters to tolerance   Electrical Stimulation Goals Edema;Pain  Ultrasound   Ultrasound Location --  posterior Rt knee   Ultrasound Parameters 100%, 1.5 w/cm2, 3.3-1.0 mhz   Ultrasound Goals Other (Comment)  tightness   Manual Therapy   Joint Mobilization anterior tibial mobs, grade III, then knee extension mobs   Myofascial Release passive release to posterior knee muscles,                   PT Short Term Goals - 11/25/14 1049    PT SHORT TERM GOAL #1   Title I with Knee ROM exercise   Status Achieved   PT SHORT TERM GOAL #2   Title increase Rt knee extension to -8 degrees   Status Achieved   PT SHORT TERM GOAL #3   Title increase Rt knee flexion 115 degrees   Status Achieved   PT SHORT TERM GOAL #4   Title ambulate without assistive device on even surfaces   Status Achieved   PT SHORT TERM GOAL #5   Title reduce pain =/> 50% with dialy activity   Status Achieved           PT Long Term Goals - 11/25/14 1049    PT LONG TERM GOAL #1   Title I  with advanced HEP   Status On-going   PT LONG TERM GOAL #2   Title ambulate on even/uneven surfaces and stairs with normalized gait   Status Partially Met   PT LONG TERM GOAL #3   Title increased strength Rt hip =/> 5/5   Status Achieved   PT LONG TERM GOAL #4   Title demo Rt knee ROM WFL    Status Partially Met   PT LONG TERM GOAL #5   Title improve FOTO =/< 47% limited   Status On-going   PT LONG TERM GOAL #6   Title lift 50# floor to waist level without trouble   Status Achieved               Plan - 11/25/14 0943    Clinical Impression Statement Pt is slowly  improving in knee extension. He does have a clunk in the Rt knee with posterior tibial mobs along with some discomfort.  His knee fatigues quickly and becomes unstable with weightbearing   Pt will benefit from skilled therapeutic intervention in order to improve on the following deficits Abnormal gait;Decreased range of motion;Difficulty walking;Impaired flexibility;Decreased skin integrity;Pain;Decreased mobility;Decreased strength;Decreased balance   Rehab Potential Excellent   PT Frequency 2x / week   PT Duration 4 weeks   PT Treatment/Interventions Moist Heat;Therapeutic activities;Patient/family education;Scar mobilization;Passive range of motion;Therapeutic exercise;DME Instruction;Ultrasound;Gait training;Balance training;Manual techniques;Neuromuscular re-education;Stair training;Cryotherapy;Electrical Stimulation   PT Next Visit Plan work on higher level single leg stance activities   Consulted and Agree with Plan of Care Patient        Problem List Patient Active Problem List   Diagnosis Date Noted  . Obese 09/30/2014  . S/P right TKA 09/29/2014  . S/P knee replacement 09/29/2014  . Excessive cerumen in ear canal 09/02/2014  . Alcohol use 07/06/2014  . Osteoarthritis of right knee 05/26/2014  . Erectile dysfunction 05/26/2014  . Numbness and tingling in left arm 07/02/2013  . Sensation of fullness  in left ear 05/02/2013  . Back pain 05/02/2013  . GERD (gastroesophageal reflux disease) 10/02/2012  . Umbilical hernia 16/04/9603  . Hypertension 07/29/2012  . Hyperlipidemia 07/29/2012  . Obesity (BMI 30-39.9) 07/29/2012    Jeral Pinch, PT 11/25/2014, 10:51 AM  [place] [phone]  [place], [place], [address] Phone: [phone]   Fax:  [phone]

## 2014-11-27 ENCOUNTER — Ambulatory Visit (INDEPENDENT_AMBULATORY_CARE_PROVIDER_SITE_OTHER): Payer: PRIVATE HEALTH INSURANCE | Admitting: Physical Therapy

## 2014-11-27 DIAGNOSIS — M7989 Other specified soft tissue disorders: Secondary | ICD-10-CM | POA: Diagnosis not present

## 2014-11-27 DIAGNOSIS — M6281 Muscle weakness (generalized): Secondary | ICD-10-CM | POA: Diagnosis not present

## 2014-11-27 DIAGNOSIS — M25561 Pain in right knee: Secondary | ICD-10-CM

## 2014-11-27 DIAGNOSIS — M25661 Stiffness of right knee, not elsewhere classified: Secondary | ICD-10-CM

## 2014-11-27 NOTE — Therapy (Signed)
[place], [place], [address] Phone: [phone]   Fax:  [phone]  Physical Therapy Treatment  Patient Details  Name: Jim Hunter MRN: [id number] Date of Birth: 07/28/1965 Referring Provider:  Paralee Cancel, MD  Encounter Date: 11/27/2014      PT End of Session - 11/27/14 0852    Visit Number 16   Number of Visits 20   Date for PT Re-Evaluation 12/11/14   PT Start Time 0845   PT Stop Time 0944   PT Time Calculation (min) 59 min   Activity Tolerance Patient tolerated treatment well      Past Medical History  Diagnosis Date  . Hypertension   . Hyperlipidemia   . Arthritis   . Sleep apnea 3 years ago    no borderline no cpap needed per sleep studty done  . GERD (gastroesophageal reflux disease)   . PPD screening test last 9 years ago first started as child    treated as child for positive ppd test    Past Surgical History  Procedure Laterality Date  . Rotator cuff repair  2010    left  . Lumbar disc surgery  2006  . Tympanoplasty      age 49  . Total knee arthroplasty Right 09/29/2014    Procedure: RIGHT TOTAL KNEE ARTHROPLASTY;  Surgeon: Paralee Cancel, MD;  Location: [facility];  Service: Orthopedics;  Laterality: Right;    There were no vitals filed for this visit.  Visit Diagnosis:  Pain in knee joint, right  Stiffness of knee joint, right  Swelling of limb  Muscle weakness (generalized)      Subjective Assessment - 11/27/14 0853    Subjective Pt reports he felt increased soreness and swelling in Rt knee/lower leg.     Currently in Pain? Yes   Pain Score 4   with "bites" up to 6/10   Pain Location Knee   Pain Orientation Right   Pain Descriptors / Indicators Burning;Hervey Ard            [facility] PT Assessment - 11/27/14 0001    Assessment   Medical Diagnosis Rt TKR   Onset Date 09/29/14                     [facility] Adult PT Treatment/Exercise - 11/27/14  0001    Knee/Hip Exercises: Aerobic   Stationary Bike L5 x 5 min    Knee/Hip Exercises: Machines for Strengthening   Cybex Knee Extension 2 plates with RLE x 25 reps, 3 plates with RLE x 8    Total Gym Leg Press 7 plates x 10 RLE, 8 plates with RLE x 10    Knee/Hip Exercises: Standing   Step Down Right;20 reps;Step Height: 6";Hand Hold: 1   SLS forward leans with hip flexion to ext x 10 each leg   SLS with Vectors standing on blue mat with red weighted ball x 15 throws to trampoline.    Walking with Sports Cord backwards walking with pink XTS x 5 ft x 4, side stepping x 5 ft x 6 each direction.    Modalities   Modalities Electrical Stimulation;Vasopneumatic   Cryotherapy   Number Minutes Cryotherapy 15 Minutes   Cryotherapy Location Knee   Electrical Stimulation   Electrical Stimulation Location Rt knee    Electrical Stimulation Action ion repelling    Electrical Stimulation Parameters to tolerance x 15 min    Electrical Stimulation Goals Edema;Pain   Vasopneumatic  Number Minutes Vasopneumatic  15 minutes   Vasopnuematic Location  Knee   Vasopneumatic Pressure Medium   Vasopneumatic Temperature  3 *   Manual Therapy   Manual Therapy Joint mobilization   Joint Mobilization anterior tibial mobs, grade III, then knee extension mobs                  PT Short Term Goals - 11/25/14 1049    PT SHORT TERM GOAL #1   Title I with Knee ROM exercise   Status Achieved   PT SHORT TERM GOAL #2   Title increase Rt knee extension to -8 degrees   Status Achieved   PT SHORT TERM GOAL #3   Title increase Rt knee flexion 115 degrees   Status Achieved   PT SHORT TERM GOAL #4   Title ambulate without assistive device on even surfaces   Status Achieved   PT SHORT TERM GOAL #5   Title reduce pain =/> 50% with dialy activity   Status Achieved           PT Long Term Goals - 11/25/14 1049    PT LONG TERM GOAL #1   Title I with advanced HEP   Status On-going   PT LONG TERM  GOAL #2   Title ambulate on even/uneven surfaces and stairs with normalized gait   Status Partially Met   PT LONG TERM GOAL #3   Title increased strength Rt hip =/> 5/5   Status Achieved   PT LONG TERM GOAL #4   Title demo Rt knee ROM WFL    Status Partially Met   PT LONG TERM GOAL #5   Title improve FOTO =/< 47% limited   Status On-going   PT LONG TERM GOAL #6   Title lift 50# floor to waist level without trouble   Status Achieved               Plan - 11/27/14 1056    Clinical Impression Statement Pt tolerated all new exercises and increased resistance with minimal increase in pain. Rt knee extension continues to improve.    Pt will benefit from skilled therapeutic intervention in order to improve on the following deficits Abnormal gait;Decreased range of motion;Difficulty walking;Impaired flexibility;Decreased skin integrity;Pain;Decreased mobility;Decreased strength;Decreased balance   Rehab Potential Excellent   PT Frequency 2x / week   PT Duration 4 weeks   PT Treatment/Interventions Moist Heat;Therapeutic activities;Patient/family education;Scar mobilization;Passive range of motion;Therapeutic exercise;DME Instruction;Ultrasound;Gait training;Balance training;Manual techniques;Neuromuscular re-education;Stair training;Cryotherapy;Electrical Stimulation   PT Next Visit Plan work on higher level single leg stance activities   Consulted and Agree with Plan of Care Patient        Problem List Patient Active Problem List   Diagnosis Date Noted  . Obese 09/30/2014  . S/P right TKA 09/29/2014  . S/P knee replacement 09/29/2014  . Excessive cerumen in ear canal 09/02/2014  . Alcohol use 07/06/2014  . Osteoarthritis of right knee 05/26/2014  . Erectile dysfunction 05/26/2014  . Numbness and tingling in left arm 07/02/2013  . Sensation of fullness in left ear 05/02/2013  . Back pain 05/02/2013  . GERD (gastroesophageal reflux disease) 10/02/2012  . Umbilical hernia  81/44/8185  . Hypertension 07/29/2012  . Hyperlipidemia 07/29/2012  . Obesity (BMI 30-39.9) 07/29/2012   Kerin Perna, PTA 11/27/2014 10:57 AM   [facility] [place], [place], [address] Phone: [phone]   Fax:  [phone]

## 2014-12-02 ENCOUNTER — Ambulatory Visit (INDEPENDENT_AMBULATORY_CARE_PROVIDER_SITE_OTHER): Payer: PRIVATE HEALTH INSURANCE | Admitting: Physical Therapy

## 2014-12-02 DIAGNOSIS — M25561 Pain in right knee: Secondary | ICD-10-CM | POA: Diagnosis not present

## 2014-12-02 DIAGNOSIS — M6281 Muscle weakness (generalized): Secondary | ICD-10-CM | POA: Diagnosis not present

## 2014-12-02 DIAGNOSIS — M7989 Other specified soft tissue disorders: Secondary | ICD-10-CM | POA: Diagnosis not present

## 2014-12-02 DIAGNOSIS — M25661 Stiffness of right knee, not elsewhere classified: Secondary | ICD-10-CM

## 2014-12-02 DIAGNOSIS — R269 Unspecified abnormalities of gait and mobility: Secondary | ICD-10-CM

## 2014-12-02 NOTE — Therapy (Signed)
[facility] [place], [place], [address] Phone: [phone]   Fax:  [phone]  Physical Therapy Treatment  Patient Details  Name: Jim Hunter MRN: [id number] Date of Birth: 1966/03/23 Referring Provider:  Paralee Cancel, MD  Encounter Date: 12/02/2014      PT End of Session - 12/02/14 0848    Visit Number 17   Number of Visits 20   Date for PT Re-Evaluation 12/11/14   PT Start Time 0848   PT Stop Time 0948   PT Time Calculation (min) 60 min   Activity Tolerance Patient tolerated treatment well      Past Medical History  Diagnosis Date  . Hypertension   . Hyperlipidemia   . Arthritis   . Sleep apnea 3 years ago    no borderline no cpap needed per sleep studty done  . GERD (gastroesophageal reflux disease)   . PPD screening test last 9 years ago first started as child    treated as child for positive ppd test    Past Surgical History  Procedure Laterality Date  . Rotator cuff repair  2010    left  . Lumbar disc surgery  2006  . Tympanoplasty      age 49  . Total knee arthroplasty Right 09/29/2014    Procedure: RIGHT TOTAL KNEE ARTHROPLASTY;  Surgeon: Paralee Cancel, MD;  Location: [facility];  Service: Orthopedics;  Laterality: Right;    There were no vitals filed for this visit.  Visit Diagnosis:  Pain in knee joint, right  Stiffness of knee joint, right  Swelling of limb  Muscle weakness (generalized)  Abnormality of gait      Subjective Assessment - 12/02/14 0850    Subjective Pt is reporting the "biting" sensation in Rt knee less often over the last week. Pt reports his Rt knee continues to occasionally buckle with even/uneven surfaces .   Currently in Pain? Yes   Pain Score 3   up to 6/10 with exercise   Pain Location Knee   Pain Orientation Right   Pain Descriptors / Indicators Burning;Sharp   Aggravating Factors  prolonged positions, squatting    Pain Relieving Factors ice,  rest, medicine            [facility] PT Assessment - 12/02/14 0001    Assessment   Medical Diagnosis Rt TKR   Onset Date 09/29/14   AROM   Right Knee Extension -4   Right Knee Flexion 118                     [facility] Adult PT Treatment/Exercise - 12/02/14 0001    Knee/Hip Exercises: Aerobic   Stationary Bike L5 x 5 min    Knee/Hip Exercises: Standing   Other Standing Knee Exercises Squat held 30 sec. Wall slide squat x 3 reps x 30 sec.  Sled pull 70# x 160 ft, repeated weight with sled push 160 ft.      Other Standing Knee Exercises squat with box pick up (27#) x 10, weights of 5-11# waist to floor lifts x 20 reps; Pushing cart with 69 # around gym (160 ft) x 2; Side stepping with blue band x 40 ft x 2 reps, 20 ft x 2 reps. Backwards step ups with RLE x 10 x 2 sets. Rt  Cross over step ups 6" steps x 15 reps.    Knee/Hip Exercises: Prone   Hamstring Curl 15 reps  5# at ankle, holding in  ext (stretch) for 10 after each rep.   Modalities   Modalities Scientist, physiological Action IFC   Electrical Stimulation Parameters to tolerance, 15 min    Electrical Stimulation Goals Pain   Vasopneumatic   Number Minutes Vasopneumatic  15 minutes   Vasopnuematic Location  Rt Knee   Vasopneumatic Pressure Medium   Vasopneumatic Temperature  3*                  PT Short Term Goals - 11/25/14 1049    PT SHORT TERM GOAL #1   Title I with Knee ROM exercise   Status Achieved   PT SHORT TERM GOAL #2   Title increase Rt knee extension to -8 degrees   Status Achieved   PT SHORT TERM GOAL #3   Title increase Rt knee flexion 115 degrees   Status Achieved   PT SHORT TERM GOAL #4   Title ambulate without assistive device on even surfaces   Status Achieved   PT SHORT TERM GOAL #5   Title reduce pain =/> 50% with dialy activity   Status Achieved           PT Long  Term Goals - 11/25/14 1049    PT LONG TERM GOAL #1   Title I with advanced HEP   Status On-going   PT LONG TERM GOAL #2   Title ambulate on even/uneven surfaces and stairs with normalized gait   Status Partially Met   PT LONG TERM GOAL #3   Title increased strength Rt hip =/> 5/5   Status Achieved   PT LONG TERM GOAL #4   Title demo Rt knee ROM WFL    Status Partially Met   PT LONG TERM GOAL #5   Title improve FOTO =/< 47% limited   Status On-going   PT LONG TERM GOAL #6   Title lift 50# floor to waist level without trouble   Status Achieved               Plan - 12/02/14 1221    Clinical Impression Statement Pt tolerated all new exercises, improving activity tolerance. Pt did report increased pain at Rt knee cap with backward step ups. Pt reported decreased pain with use of vaso/estim after session.    Pt will benefit from skilled therapeutic intervention in order to improve on the following deficits Abnormal gait;Decreased range of motion;Difficulty walking;Impaired flexibility;Decreased skin integrity;Pain;Decreased mobility;Decreased strength;Decreased balance   Rehab Potential Excellent   PT Frequency 2x / week   PT Duration 4 weeks   PT Treatment/Interventions Moist Heat;Therapeutic activities;Patient/family education;Scar mobilization;Passive range of motion;Therapeutic exercise;DME Instruction;Ultrasound;Gait training;Balance training;Manual techniques;Neuromuscular re-education;Stair training;Cryotherapy;Electrical Stimulation   PT Next Visit Plan work on higher level single leg stance activities and more work simulated exercises to aid in return to work   [employer] and Orthoptist with Plan of Care Patient        Problem List Patient Active Problem List   Diagnosis Date Noted  . Obese 09/30/2014  . S/P right TKA 09/29/2014  . S/P knee replacement 09/29/2014  . Excessive cerumen in ear canal 09/02/2014  . Alcohol use 07/06/2014  . Osteoarthritis of right knee  05/26/2014  . Erectile dysfunction 05/26/2014  . Numbness and tingling in left arm 07/02/2013  . Sensation of fullness in left ear 05/02/2013  . Back pain 05/02/2013  . GERD (gastroesophageal reflux disease) 10/02/2012  . Umbilical hernia  10/02/2012  . Hypertension 07/29/2012  . Hyperlipidemia 07/29/2012  . Obesity (BMI 30-39.9) 07/29/2012   Kerin Perna, PTA 12/02/2014 12:25 PM  [place], [place], [address] Phone: [phone]   Fax:  [phone]

## 2014-12-04 ENCOUNTER — Ambulatory Visit (INDEPENDENT_AMBULATORY_CARE_PROVIDER_SITE_OTHER): Payer: 59 | Admitting: Physical Therapy

## 2014-12-04 DIAGNOSIS — M7989 Other specified soft tissue disorders: Secondary | ICD-10-CM

## 2014-12-04 DIAGNOSIS — M6281 Muscle weakness (generalized): Secondary | ICD-10-CM

## 2014-12-04 DIAGNOSIS — M25561 Pain in right knee: Secondary | ICD-10-CM

## 2014-12-04 DIAGNOSIS — M25661 Stiffness of right knee, not elsewhere classified: Secondary | ICD-10-CM | POA: Diagnosis not present

## 2014-12-04 DIAGNOSIS — R269 Unspecified abnormalities of gait and mobility: Secondary | ICD-10-CM

## 2014-12-04 NOTE — Therapy (Signed)
[place], [place], [address] Phone: [phone]   Fax:  [phone]  Physical Therapy Treatment  Patient Details  Name: Jim Hunter MRN: [id number] Date of Birth: [birth year] Referring Provider:  Leone Brand, MD  Encounter Date: 12/04/2014      PT End of Session - 12/04/14 0852    Visit Number 18   Number of Visits 20   Date for PT Re-Evaluation 12/11/14   PT Start Time 0849   PT Stop Time 0945   PT Time Calculation (min) 56 min      Past Medical History  Diagnosis Date  . Hypertension   . Hyperlipidemia   . Arthritis   . Sleep apnea 3 years ago    no borderline no cpap needed per sleep studty done  . GERD (gastroesophageal reflux disease)   . PPD screening test last 9 years ago first started as child    treated as child for positive ppd test    Past Surgical History  Procedure Laterality Date  . Rotator cuff repair  2010    left  . Lumbar disc surgery  2006  . Tympanoplasty      age 30  . Total knee arthroplasty Right 09/29/2014    Procedure: RIGHT TOTAL KNEE ARTHROPLASTY;  Surgeon: Paralee Cancel, MD;  Location: [facility];  Service: Orthopedics;  Laterality: Right;    There were no vitals filed for this visit.  Visit Diagnosis:  Pain in knee joint, right  Stiffness of knee joint, right  Swelling of limb  Muscle weakness (generalized)  Abnormality of gait      Subjective Assessment - 12/04/14 0855    Subjective Pt reports that after last few sessions his Rt knee likes to "creep back" into flexion and increased pain (4/10).  He does feel he is getting along better.    Currently in Pain? Yes   Pain Score 3    Pain Location Knee   Pain Orientation Right   Pain Descriptors / Indicators Burning;Aching            [facility] PT Assessment - 12/04/14 0001    Assessment   Medical Diagnosis Rt TKR   Onset Date 09/29/14   AROM   Right Knee Extension -4                      [facility] Adult PT Treatment/Exercise - 12/04/14 0001    Knee/Hip Exercises: Stretches   Gastroc Stretch 2 reps;30 seconds   Soleus Stretch 2 reps;30 seconds   Knee/Hip Exercises: Aerobic   Stationary Bike L6 x 5 min    Elliptical L2 x 4 min    Knee/Hip Exercises: Standing   Heel Raises 2 sets;10 reps   Step Down Right;Left;2 sets;10 reps  heel taps, 6"   Other Standing Knee Exercises standing lunges with R foot on 13" step, holding 10 sec with knee flexion, then followed by R hamstring stretch for 15 sec x 10 reps. Side stepping with blue band at ankles x 20 ft Rt/ Lt x 2 sets   Other Standing Knee Exercises Lifting various wts overhead/ eye level to ground x 10 reps each direction (5-11#)   Knee/Hip Exercises: Prone   Hamstring Curl 10 reps  7# at ankle, holding in ext (stretch) for 10 after each rep.   Modalities   Modalities Scientist, physiological  Action IFC   Electrical Stimulation Parameters to tolerance    Electrical Stimulation Goals Pain   Ultrasound   Ultrasound Location      Vasopneumatic   Number Minutes Vasopneumatic  15 minutes   Vasopnuematic Location  Knee   Vasopneumatic Pressure Medium   Vasopneumatic Temperature  3*                  PT Short Term Goals - 11/25/14 1049    PT SHORT TERM GOAL #1   Title I with Knee ROM exercise   Status Achieved   PT SHORT TERM GOAL #2   Title increase Rt knee extension to -8 degrees   Status Achieved   PT SHORT TERM GOAL #3   Title increase Rt knee flexion 115 degrees   Status Achieved   PT SHORT TERM GOAL #4   Title ambulate without assistive device on even surfaces   Status Achieved   PT SHORT TERM GOAL #5   Title reduce pain =/> 50% with dialy activity   Status Achieved           PT Long Term Goals - 11/25/14 1049    PT LONG TERM GOAL #1   Title I with advanced  HEP   Status On-going   PT LONG TERM GOAL #2   Title ambulate on even/uneven surfaces and stairs with normalized gait   Status Partially Met   PT LONG TERM GOAL #3   Title increased strength Rt hip =/> 5/5   Status Achieved   PT LONG TERM GOAL #4   Title demo Rt knee ROM WFL    Status Partially Met   PT LONG TERM GOAL #5   Title improve FOTO =/< 47% limited   Status On-going   PT LONG TERM GOAL #6   Title lift 50# floor to waist level without trouble   Status Achieved               Problem List Patient Active Problem List   Diagnosis Date Noted  . Obese 09/30/2014  . S/P right TKA 09/29/2014  . S/P knee replacement 09/29/2014  . Excessive cerumen in ear canal 09/02/2014  . Alcohol use 07/06/2014  . Osteoarthritis of right knee 05/26/2014  . Erectile dysfunction 05/26/2014  . Numbness and tingling in left arm 07/02/2013  . Sensation of fullness in left ear 05/02/2013  . Back pain 05/02/2013  . GERD (gastroesophageal reflux disease) 10/02/2012  . Umbilical hernia 36/64/4034  . Hypertension 07/29/2012  . Hyperlipidemia 07/29/2012  . Obesity (BMI 30-39.9) 07/29/2012    Shelbie Hutching 12/04/2014, 2:26 PM  [facility] [place], [place], [address] Phone: [phone]   Fax:  [phone]

## 2014-12-09 ENCOUNTER — Ambulatory Visit (INDEPENDENT_AMBULATORY_CARE_PROVIDER_SITE_OTHER): Payer: PRIVATE HEALTH INSURANCE | Admitting: Physical Therapy

## 2014-12-09 DIAGNOSIS — M25561 Pain in right knee: Secondary | ICD-10-CM | POA: Diagnosis not present

## 2014-12-09 DIAGNOSIS — R269 Unspecified abnormalities of gait and mobility: Secondary | ICD-10-CM

## 2014-12-09 DIAGNOSIS — M7989 Other specified soft tissue disorders: Secondary | ICD-10-CM | POA: Diagnosis not present

## 2014-12-09 DIAGNOSIS — M6281 Muscle weakness (generalized): Secondary | ICD-10-CM

## 2014-12-09 DIAGNOSIS — M25661 Stiffness of right knee, not elsewhere classified: Secondary | ICD-10-CM

## 2014-12-09 NOTE — Therapy (Addendum)
[place], [place], [address] Phone: [phone]   Fax:  [phone]  Physical Therapy Treatment  Patient Details  Name: Jim Hunter MRN: [id number] Date of Birth: 1966-03-18 Referring Provider:  Paralee Cancel, MD  Encounter Date: 12/09/2014      PT End of Session - 12/09/14 0849    Visit Number 19   Number of Visits 20   Date for PT Re-Evaluation 12/11/14   PT Start Time 0846   PT Stop Time 0953   PT Time Calculation (min) 67 min      Past Medical History  Diagnosis Date  . Hypertension   . Hyperlipidemia   . Arthritis   . Sleep apnea 3 years ago    no borderline no cpap needed per sleep studty done  . GERD (gastroesophageal reflux disease)   . PPD screening test last 9 years ago first started as child    treated as child for positive ppd test    Past Surgical History  Procedure Laterality Date  . Rotator cuff repair  2010    left  . Lumbar disc surgery  2006  . Tympanoplasty      age 89  . Total knee arthroplasty Right 09/29/2014    Procedure: RIGHT TOTAL KNEE ARTHROPLASTY;  Surgeon: Paralee Cancel, MD;  Location: [facility];  Service: Orthopedics;  Laterality: Right;    There were no vitals filed for this visit.  Visit Diagnosis:  Pain in knee joint, right  Stiffness of knee joint, right  Swelling of limb  Muscle weakness (generalized)  Abnormality of gait      Subjective Assessment - 12/09/14 0847    Subjective Pt reports he has been walking and performing more lifting at home; back is sore. Pt still having difficult getting in/out of truck and tub, as well as terraine changes.    Currently in Pain? Yes   Pain Score 3    Pain Location Knee   Pain Orientation Right   Pain Descriptors / Indicators Burning;Aching   Aggravating Factors  prolonged positions, squatting    Pain Relieving Factors ice, rest, medicine            [facility] PT Assessment - 12/09/14 0001    Assessment   Medical Diagnosis Rt TKR   Onset Date/Surgical Date 09/29/14                     [facility] Adult PT Treatment/Exercise - 12/09/14 0001    Knee/Hip Exercises: Aerobic   Stationary Bike L6 x 5 min    Elliptical L3:62mn    Knee/Hip Exercises: Standing   Heel Raises 2 sets;15 reps  RLE only, calf stretch x 30 sec x 2 reps    Other Standing Knee Exercises Rt Single leg sit to stands (step on low mat) x 5 reps x 2 sets   Other Standing Knee Exercises standing hip/knee flexion to clear 18" step x 20 reps (with UE support), then up and over with RLE x 15 reps    Knee/Hip Exercises: Seated   Other Seated Knee Exercises stand to sit on low mat simulating knee flexion needed to clear inside car. x 8 reps    Knee/Hip Exercises: Prone   Hamstring Curl 2 sets;10 reps  7.5#, with 1 min prone hang between sets   Modalities   Modalities Electrical Stimulation;Ultrasound;Cryotherapy   Cryotherapy   Number Minutes Cryotherapy 15 Minutes   Cryotherapy Location Knee  Rt  Type of Cryotherapy Ice pack   Electrical Stimulation   Electrical Stimulation Location Rt knee    Electrical Stimulation Action IFC   Electrical Stimulation Parameters to tolerance x 15 min    Electrical Stimulation Goals Pain   Ultrasound   Ultrasound Location posterior Rt knee (distal hamstring)    Ultrasound Parameters 100%, 1.0 mHz, 1.3 w/cm2 x 8 min    Ultrasound Goals Other (Comment);Pain  Increase tissue extensibility               PT Short Term Goals - 11/25/14 1049    PT SHORT TERM GOAL #1   Title I with Knee ROM exercise   Status Achieved   PT SHORT TERM GOAL #2   Title increase Rt knee extension to -8 degrees   Status Achieved   PT SHORT TERM GOAL #3   Title increase Rt knee flexion 115 degrees   Status Achieved   PT SHORT TERM GOAL #4   Title ambulate without assistive device on even surfaces   Status Achieved   PT SHORT TERM GOAL #5   Title reduce pain =/> 50% with dialy  activity   Status Achieved           PT Long Term Goals - 11/25/14 1049    PT LONG TERM GOAL #1   Title I with advanced HEP   Status On-going   PT LONG TERM GOAL #2   Title ambulate on even/uneven surfaces and stairs with normalized gait   Status Partially Met   PT LONG TERM GOAL #3   Title increased strength Rt hip =/> 5/5   Status Achieved   PT LONG TERM GOAL #4   Title demo Rt knee ROM WFL    Status Partially Met   PT LONG TERM GOAL #5   Title improve FOTO =/< 47% limited   Status On-going   PT LONG TERM GOAL #6   Title lift 50# floor to waist level without trouble   Status Achieved         PT Long Term Goals - 12/10/14 1610    PT LONG TERM GOAL #1   Title I with advanced HEP ( 01/01/15)   Time 3   Status On-going   PT LONG TERM GOAL #2   Title ambulate on even/uneven surfaces and stairs with normalized gait ( 01/01/15)   Status Partially Met   PT LONG TERM GOAL #3   Title increased strength Rt hip =/> 5/5   Status Achieved   PT LONG TERM GOAL #4   Title demo Rt knee ROM WFL  ( 01/01/15)   Status Partially Met   PT LONG TERM GOAL #5   Title improve FOTO =/< 47% limited ( 01/01/15)   Status On-going   Additional Long Term Goals   Additional Long Term Goals Yes   PT LONG TERM GOAL #6   Title lift 50# floor to waist level without trouble   Status Achieved   PT LONG TERM GOAL #7   Title I and painfree with lifting and getting his Rt LE into his truck (01/01/15)   Time 3   Period Weeks   Status New   PT LONG TERM GOAL #8   Title I without difficulty stepping Rt LE in/out of his tub ([address])   Time 3   Period Weeks   Status New           Plan - 12/09/14 1210    Clinical Impression Statement Pt demo improved  Rt hip/knee flexion with high obstacle with repetition. Pt continues to lack 4 degrees of Rt knee extension. Pt progressing towards goals, compliant with HEP.    Pt will benefit from skilled therapeutic intervention in order to improve on the following  deficits Abnormal gait;Decreased range of motion;Difficulty walking;Impaired flexibility;Decreased skin integrity;Pain;Decreased mobility;Decreased strength;Decreased balance   Rehab Potential Excellent   PT Frequency 2x / week   PT Duration 4 weeks   PT Treatment/Interventions Moist Heat;Therapeutic activities;Patient/family education;Scar mobilization;Passive range of motion;Therapeutic exercise;DME Instruction;Ultrasound;Gait training;Balance training;Manual techniques;Neuromuscular re-education;Stair training;Cryotherapy;Electrical Stimulation   PT Next Visit Plan work on higher level single leg stance activities and more work simulated exercises to aid in return to work   [employer] and Orthoptist with Plan of Care Patient        Problem List Patient Active Problem List   Diagnosis Date Noted  . Obese 09/30/2014  . S/P right TKA 09/29/2014  . S/P knee replacement 09/29/2014  . Excessive cerumen in ear canal 09/02/2014  . Alcohol use 07/06/2014  . Osteoarthritis of right knee 05/26/2014  . Erectile dysfunction 05/26/2014  . Numbness and tingling in left arm 07/02/2013  . Sensation of fullness in left ear 05/02/2013  . Back pain 05/02/2013  . GERD (gastroesophageal reflux disease) 10/02/2012  . Umbilical hernia 29/03/300  . Hypertension 07/29/2012  . Hyperlipidemia 07/29/2012  . Obesity (BMI 30-39.9) 07/29/2012   Kerin Perna, PTA 12/09/2014 12:39 PM  [place], [place], [address] Phone: [phone]   Fax:  [place], PT 12/10/2014 8:21 AM

## 2014-12-10 NOTE — Addendum Note (Signed)
Addended by: Kinzley Savell, Manuela Schwartz E on: 12/10/2014 08:24 AM   Modules accepted: Orders

## 2014-12-11 ENCOUNTER — Encounter: Payer: Self-pay | Admitting: Physical Therapy

## 2014-12-16 ENCOUNTER — Ambulatory Visit (INDEPENDENT_AMBULATORY_CARE_PROVIDER_SITE_OTHER): Payer: PRIVATE HEALTH INSURANCE | Admitting: Physical Therapy

## 2014-12-16 DIAGNOSIS — M7989 Other specified soft tissue disorders: Secondary | ICD-10-CM | POA: Diagnosis not present

## 2014-12-16 DIAGNOSIS — M25661 Stiffness of right knee, not elsewhere classified: Secondary | ICD-10-CM

## 2014-12-16 DIAGNOSIS — M25561 Pain in right knee: Secondary | ICD-10-CM

## 2014-12-16 DIAGNOSIS — M6281 Muscle weakness (generalized): Secondary | ICD-10-CM | POA: Diagnosis not present

## 2014-12-16 NOTE — Patient Instructions (Signed)
Band Walk: Side Stepping   Tie band around legs, just above knees. Step _20__ feet to one side, then step back to start. Repeat _2-3__ times.     Hamstring Curl: Resisted (Sitting)   Facing anchor with tubing on right ankle, leg straight out, bend knee. Repeat __12__ times per set. Do __2__ sets per session. Do __1__ sessions per day.   Knee Extension: Terminal - Standing (Single Leg)   Face anchor in shoulder width stance, band around knee. Allow tension of band to slightly bend knee. Pull leg back, straightening knee- hold 10 seconds. Repeat _10_ times per set. Repeat with other leg. Do 2__ sets per session. Do _3-4_ sessions per week. Anchor Height: Knee

## 2014-12-16 NOTE — Therapy (Signed)
[place], [place], [address] Phone: [phone]   Fax:  [phone]  Physical Therapy Treatment  Patient Details  Name: Jim Hunter MRN: [id number] Date of Birth: Oct 25, 1965 Referring Provider:  Paralee Cancel, MD  Encounter Date: 12/16/2014      PT End of Session - 12/16/14 0846    Visit Number 20   Number of Visits 23   Date for PT Re-Evaluation 01/01/15   PT Start Time 0843   PT Stop Time 0957   PT Time Calculation (min) 74 min   Activity Tolerance Patient tolerated treatment well      Past Medical History  Diagnosis Date  . Hypertension   . Hyperlipidemia   . Arthritis   . Sleep apnea 3 years ago    no borderline no cpap needed per sleep studty done  . GERD (gastroesophageal reflux disease)   . PPD screening test last 9 years ago first started as child    treated as child for positive ppd test    Past Surgical History  Procedure Laterality Date  . Rotator cuff repair  2010    left  . Lumbar disc surgery  2006  . Tympanoplasty      age 33  . Total knee arthroplasty Right 09/29/2014    Procedure: RIGHT TOTAL KNEE ARTHROPLASTY;  Surgeon: Paralee Cancel, MD;  Location: [facility];  Service: Orthopedics;  Laterality: Right;    There were no vitals filed for this visit.  Visit Diagnosis:  Pain in knee joint, right  Stiffness of knee joint, right  Swelling of limb  Muscle weakness (generalized)      Subjective Assessment - 12/16/14 0847    Subjective Pt reports his Rt knee continues to buckle after taking walks. Getting in/out of truck and tub is still difficult and painful. [employer]AS brace representative to visit pt today after therapy to check fitting of brace (hasn't worn extension brace due to fitting issues).    Currently in Pain? Yes   Pain Score 2    Pain Location Knee   Pain Orientation Right   Pain Descriptors / Indicators Aching   Aggravating Factors  prolonged positions,  squatting   Pain Relieving Factors ice, rest, medicine            [facility] PT Assessment - 12/16/14 0001    Assessment   Medical Diagnosis Rt TKR   Onset Date/Surgical Date 09/29/14   Next MD Visit 12/30/14   AROM   Right/Left Knee Right;Left   Right Knee Extension -4   Right Knee Flexion 118   Left Knee Flexion 138                     [facility] Adult PT Treatment/Exercise - 12/16/14 0001    Knee/Hip Exercises: Stretches   Passive Hamstring Stretch 3 reps;30 seconds   Gastroc Stretch 30 seconds;2 reps   Soleus Stretch 2 reps;20 seconds   Knee/Hip Exercises: Aerobic   Elliptical L3: 46mn    Knee/Hip Exercises: Standing   Heel Raises 3 sets;10 reps  toe in, out, straight RLE only   Forward Lunges 1 set;10 reps  Rt ft back, to increase ROM   Terminal Knee Extension Strengthening;Right;1 set;10 reps  10 sec hold    Theraband Level (Terminal Knee Extension) Level 3 (Green)   SLS with green resisted kicks x 10 each leg   Other Standing Knee Exercises Lt kneeling to standing x 10;  Sled push/pull with  80# x 20 ft each way x 3;  side stepping with blue band at ankles 20 ft x    Other Standing Knee Exercises standing hip/knee flexion to clear 18" step, then up and over with RLE x 20 reps    Knee/Hip Exercises: Seated   Other Seated Knee Exercises hamstring curl (RLE) with green band x 20 reps    Knee/Hip Exercises: Supine   Other Supine Knee Exercises return demo of RLE hip/knee flexion with assist of weight of LLE  x 60 sec    Modalities   Modalities Electrical Stimulation;Vasopneumatic   Electrical Stimulation   Electrical Stimulation Location Rt knee    Electrical Stimulation Action IFC   Electrical Stimulation Parameters to tolerance x 15 min   Electrical Stimulation Goals Pain   Vasopneumatic   Number Minutes Vasopneumatic  15 minutes   Vasopnuematic Location  Knee   Vasopneumatic Pressure Medium   Vasopneumatic Temperature  3*                PT  Education - 12/16/14 1003    Education provided Yes   Education Details HEP- issued paper copy of knee flexion stretch (prone and supine), TKE, hamstring curls, and side stepping.    Person(s) Educated Patient   Methods Handout;Explanation   Comprehension Verbalized understanding;Returned demonstration          PT Short Term Goals - 11/25/14 1049    PT SHORT TERM GOAL #1   Title I with Knee ROM exercise   Status Achieved   PT SHORT TERM GOAL #2   Title increase Rt knee extension to -8 degrees   Status Achieved   PT SHORT TERM GOAL #3   Title increase Rt knee flexion 115 degrees   Status Achieved   PT SHORT TERM GOAL #4   Title ambulate without assistive device on even surfaces   Status Achieved   PT SHORT TERM GOAL #5   Title reduce pain =/> 50% with dialy activity   Status Achieved           PT Long Term Goals - 12/10/14 4174    PT LONG TERM GOAL #1   Title I with advanced HEP ( 01/01/15)   Time 3   Status On-going   PT LONG TERM GOAL #2   Title ambulate on even/uneven surfaces and stairs with normalized gait ( 01/01/15)   Status Partially Met   PT LONG TERM GOAL #3   Title increased strength Rt hip =/> 5/5   Status Achieved   PT LONG TERM GOAL #4   Title demo Rt knee ROM WFL  ( 01/01/15)   Status Partially Met   PT LONG TERM GOAL #5   Title improve FOTO =/< 47% limited ( 01/01/15)   Status On-going   Additional Long Term Goals   Additional Long Term Goals Yes   PT LONG TERM GOAL #6   Title lift 50# floor to waist level without trouble   Status Achieved   PT LONG TERM GOAL #7   Title I and painfree with lifting and getting his Rt LE into his truck (01/01/15)   Time 3   Period Weeks   Status New   PT LONG TERM GOAL #8   Title I without difficulty stepping Rt LE in/out of his tub ([address])   Time 3   Period Weeks   Status New               Plan - 12/16/14 1029  Clinical Impression Statement Pt's Rt knee ROM remains unchanged from last session  affecting his functional tasks such as donning shoes and getting in/out of tub and car.  Pt tolerated all new exercises with minimal report of pain.  Pt did report "itchy" sensation in Rt knee after Rt knee stretch.    Pt will benefit from skilled therapeutic intervention in order to improve on the following deficits Abnormal gait;Decreased range of motion;Difficulty walking;Impaired flexibility;Decreased skin integrity;Pain;Decreased mobility;Decreased strength;Decreased balance   Rehab Potential Excellent   PT Frequency 2x / week   PT Duration 4 weeks   PT Treatment/Interventions Moist Heat;Therapeutic activities;Patient/family education;Scar mobilization;Passive range of motion;Therapeutic exercise;DME Instruction;Ultrasound;Gait training;Balance training;Manual techniques;Neuromuscular re-education;Stair training;Cryotherapy;Electrical Stimulation   PT Next Visit Plan work on higher level single leg stance activities and more work simulated exercises to aid in return to work   [employer] and Orthoptist with Plan of Care Patient        Problem List Patient Active Problem List   Diagnosis Date Noted  . Obese 09/30/2014  . S/P right TKA 09/29/2014  . S/P knee replacement 09/29/2014  . Excessive cerumen in ear canal 09/02/2014  . Alcohol use 07/06/2014  . Osteoarthritis of right knee 05/26/2014  . Erectile dysfunction 05/26/2014  . Numbness and tingling in left arm 07/02/2013  . Sensation of fullness in left ear 05/02/2013  . Back pain 05/02/2013  . GERD (gastroesophageal reflux disease) 10/02/2012  . Umbilical hernia 46/10/7996  . Hypertension 07/29/2012  . Hyperlipidemia 07/29/2012  . Obesity (BMI 30-39.9) 07/29/2012    Kerin Perna, PTA 12/16/2014 10:40 AM  [place], [place], [address] Phone: [phone]   Fax:  [phone]

## 2014-12-23 ENCOUNTER — Ambulatory Visit (INDEPENDENT_AMBULATORY_CARE_PROVIDER_SITE_OTHER): Payer: PRIVATE HEALTH INSURANCE | Admitting: Physical Therapy

## 2014-12-23 DIAGNOSIS — M25561 Pain in right knee: Secondary | ICD-10-CM | POA: Diagnosis not present

## 2014-12-23 DIAGNOSIS — M6281 Muscle weakness (generalized): Secondary | ICD-10-CM | POA: Diagnosis not present

## 2014-12-23 DIAGNOSIS — M25661 Stiffness of right knee, not elsewhere classified: Secondary | ICD-10-CM

## 2014-12-23 DIAGNOSIS — R269 Unspecified abnormalities of gait and mobility: Secondary | ICD-10-CM | POA: Diagnosis not present

## 2014-12-23 NOTE — Therapy (Signed)
[employer], [place], [address] Phone: [phone]   Fax:  [phone]  Physical Therapy Treatment  Patient Details  Name: Jim Hunter MRN: [id number] Date of Birth: Jul 29, 1965 Referring Provider:  Paralee Cancel, MD  Encounter Date: 12/23/2014      PT End of Session - 12/23/14 0849    Visit Number 21   Number of Visits 23   Date for PT Re-Evaluation 01/01/15   PT Start Time 0849   PT Stop Time 0956   PT Time Calculation (min) 67 min      Past Medical History  Diagnosis Date  . Hypertension   . Hyperlipidemia   . Arthritis   . Sleep apnea 3 years ago    no borderline no cpap needed per sleep studty done  . GERD (gastroesophageal reflux disease)   . PPD screening test last 9 years ago first started as child    treated as child for positive ppd test    Past Surgical History  Procedure Laterality Date  . Rotator cuff repair  2010    left  . Lumbar disc surgery  2006  . Tympanoplasty      age 49  . Total knee arthroplasty Right 09/29/2014    Procedure: RIGHT TOTAL KNEE ARTHROPLASTY;  Surgeon: Paralee Cancel, MD;  Location: [facility];  Service: Orthopedics;  Laterality: Right;    There were no vitals filed for this visit.  Visit Diagnosis:  Pain in knee joint, right  Stiffness of knee joint, right  Muscle weakness (generalized)  Abnormality of gait      Subjective Assessment - 12/23/14 0850    Subjective Pt reports his pain level has improved, but he still experiences stiffness and "bites" of pain occasionally. Pt continues to have issues of buckling when Rt leg is fatigued.  JAS brace rep re-measured and is to provide improved brace parts; awaiting approval from case manager.    Currently in Pain? Yes   Pain Score 2    Pain Location Knee   Pain Orientation Right   Pain Descriptors / Indicators Aching   Aggravating Factors  prolonged positions, squatting   Pain Relieving Factors ice,  rest, medicine.             [facility] PT Assessment - 12/23/14 0001    Assessment   Medical Diagnosis Rt TKR   Onset Date/Surgical Date 09/29/14   Next MD Visit 12/30/14   AROM   AROM Assessment Site Knee   Right/Left Knee Right;Left   Right Knee Extension -4   Right Knee Flexion 120   Left Knee Extension 10  hyperextension   Left Knee Flexion 138                     [facility] Adult PT Treatment/Exercise - 12/23/14 0001    Knee/Hip Exercises: Aerobic   Stationary Bike L6: 6.5 min    Knee/Hip Exercises: Machines for Strengthening   Cybex Knee Extension 3 plates x 10 reps, 2 sets   Total Gym Leg Press 8 plates RLE x 10, x 2 sets   Knee/Hip Exercises: Standing   Forward Lunges Left;Right;10 reps;2 sets  with 4# wts in hands    Forward Lunges Limitations VC and demo for technique   Side Lunges Right;Left;10 reps;2 sets  with 4# in hands   Step Down Right;1 set;Hand Hold: 1;Step Height: 6";10 reps   Other Standing Knee Exercises Sled push 85#  160 ft.  Other Standing Knee Exercises 35# box lift from floor to waist x 5 reps;    Modalities   Modalities Electrical Stimulation;Moist Heat;Cryotherapy   Moist Heat Therapy   Number Minutes Moist Heat 15 Minutes   Moist Heat Location Knee  posterior Rt   Cryotherapy   Number Minutes Cryotherapy 15 Minutes   Cryotherapy Location Knee  Anterior Rt   Type of Cryotherapy Ice pack   Electrical Stimulation   Electrical Stimulation Location Rt knee    Electrical Stimulation Action IFC   Electrical Stimulation Parameters to tolerance x 15 min    Electrical Stimulation Goals Pain                PT Education - 12/23/14 1216    Education provided Yes   Education Details HEP- instructed pt to complete Lt heel taps (for improved eccentric Rt quad control) x 10 reps x 2-3 sets daily. Cont with previous HEP.    Person(s) Educated Patient   Methods Explanation   Comprehension Verbalized understanding;Returned  demonstration          PT Short Term Goals - 11/25/14 1049    PT SHORT TERM GOAL #1   Title I with Knee ROM exercise   Status Achieved   PT SHORT TERM GOAL #2   Title increase Rt knee extension to -8 degrees   Status Achieved   PT SHORT TERM GOAL #3   Title increase Rt knee flexion 115 degrees   Status Achieved   PT SHORT TERM GOAL #4   Title ambulate without assistive device on even surfaces   Status Achieved   PT SHORT TERM GOAL #5   Title reduce pain =/> 50% with dialy activity   Status Achieved           PT Long Term Goals - 12/10/14 1062    PT LONG TERM GOAL #1   Title I with advanced HEP ( 01/01/15)   Time 3   Status On-going   PT LONG TERM GOAL #2   Title ambulate on even/uneven surfaces and stairs with normalized gait ( 01/01/15)   Status Partially Met   PT LONG TERM GOAL #3   Title increased strength Rt hip =/> 5/5   Status Achieved   PT LONG TERM GOAL #4   Title demo Rt knee ROM WFL  ( 01/01/15)   Status Partially Met   PT LONG TERM GOAL #5   Title improve FOTO =/< 47% limited ( 01/01/15)   Status On-going   Additional Long Term Goals   Additional Long Term Goals Yes   PT LONG TERM GOAL #6   Title lift 50# floor to waist level without trouble   Status Achieved   PT LONG TERM GOAL #7   Title I and painfree with lifting and getting his Rt LE into his truck (01/01/15)   Time 3   Period Weeks   Status New   PT LONG TERM GOAL #8   Title I without difficulty stepping Rt LE in/out of his tub ([address])   Time 3   Period Weeks   Status New               Plan - 12/23/14 1219    Clinical Impression Statement Pt gained 2 degrees of Rt knee flexion from last session. Pt tolerated all exercises without increased pain, demonstrating improved LE strength with increased reps/weight. Progressing towards goals. Pt reported less stiffness with use of MHP on Rt post knee at end of  session.    Pt will benefit from skilled therapeutic intervention in order to  improve on the following deficits Abnormal gait;Decreased range of motion;Difficulty walking;Impaired flexibility;Decreased skin integrity;Pain;Decreased mobility;Decreased strength;Decreased balance   Rehab Potential Excellent   PT Frequency 2x / week   PT Duration 4 weeks   PT Treatment/Interventions Moist Heat;Therapeutic activities;Patient/family education;Scar mobilization;Passive range of motion;Therapeutic exercise;DME Instruction;Ultrasound;Gait training;Balance training;Manual techniques;Neuromuscular re-education;Stair training;Cryotherapy;Electrical Stimulation   PT Next Visit Plan work on higher level single leg stance activities and more work simulated exercises to aid in return to work.  Assess goals, knee ROM + strength; write MD note. Complete FOTO.    Consulted and Agree with Plan of Care Patient        Problem List Patient Active Problem List   Diagnosis Date Noted  . Obese 09/30/2014  . S/P right TKA 09/29/2014  . S/P knee replacement 09/29/2014  . Excessive cerumen in ear canal 09/02/2014  . Alcohol use 07/06/2014  . Osteoarthritis of right knee 05/26/2014  . Erectile dysfunction 05/26/2014  . Numbness and tingling in left arm 07/02/2013  . Sensation of fullness in left ear 05/02/2013  . Back pain 05/02/2013  . GERD (gastroesophageal reflux disease) 10/02/2012  . Umbilical hernia 47/18/5501  . Hypertension 07/29/2012  . Hyperlipidemia 07/29/2012  . Obesity (BMI 30-39.9) 07/29/2012    Kerin Perna, PTA 12/23/2014 12:27 PM  [place], [place], [address] Phone: [phone]   Fax:  [phone]

## 2014-12-29 ENCOUNTER — Ambulatory Visit (INDEPENDENT_AMBULATORY_CARE_PROVIDER_SITE_OTHER): Payer: PRIVATE HEALTH INSURANCE | Admitting: Physical Therapy

## 2014-12-29 ENCOUNTER — Encounter: Payer: Self-pay | Admitting: Physical Therapy

## 2014-12-29 DIAGNOSIS — M25561 Pain in right knee: Secondary | ICD-10-CM

## 2014-12-29 DIAGNOSIS — M25661 Stiffness of right knee, not elsewhere classified: Secondary | ICD-10-CM | POA: Diagnosis not present

## 2014-12-29 DIAGNOSIS — M7989 Other specified soft tissue disorders: Secondary | ICD-10-CM

## 2014-12-29 DIAGNOSIS — R269 Unspecified abnormalities of gait and mobility: Secondary | ICD-10-CM | POA: Diagnosis not present

## 2014-12-29 DIAGNOSIS — M6281 Muscle weakness (generalized): Secondary | ICD-10-CM

## 2014-12-29 NOTE — Therapy (Signed)
[facility] [place] , [place], [address] Phone: [phone]   Fax:  [phone]  Physical Therapy Treatment  Patient Details  Name: Jim Hunter MRN: [id number] Date of Birth: 10-Apr-1966 Referring Provider:  Paralee Cancel, MD  Encounter Date: 12/29/2014      PT End of Session - 12/29/14 0951    Visit Number 22   Number of Visits 23   Date for PT Re-Evaluation 01/01/15   PT Start Time 8676   PT Stop Time 0952   PT Time Calculation (min) 65 min      Past Medical History  Diagnosis Date  . Hypertension   . Hyperlipidemia   . Arthritis   . Sleep apnea 3 years ago    no borderline no cpap needed per sleep studty done  . GERD (gastroesophageal reflux disease)   . PPD screening test last 9 years ago first started as child    treated as child for positive ppd test    Past Surgical History  Procedure Laterality Date  . Rotator cuff repair  2010    left  . Lumbar disc surgery  2006  . Tympanoplasty      age 49  . Total knee arthroplasty Right 09/29/2014    Procedure: RIGHT TOTAL KNEE ARTHROPLASTY;  Surgeon: Paralee Cancel, MD;  Location: [facility];  Service: Orthopedics;  Laterality: Right;    There were no vitals filed for this visit.  Visit Diagnosis:  Pain in knee joint, right  Stiffness of knee joint, right  Muscle weakness (generalized)  Abnormality of gait  Swelling of limb      Subjective Assessment - 12/29/14 0852    Subjective Pt was stung by a bee or wasp yesterday in the hand and arm and he is swollen up now.    Currently in Pain? Yes   Pain Score 3    Pain Location Knee   Pain Orientation Right   Pain Descriptors / Indicators Aching   Aggravating Factors  squatting, attempting to run from a wasp   Pain Relieving Factors rest , ice             [facility] PT Assessment - 12/29/14 0001    Assessment   Medical Diagnosis Rt TKR   Onset Date/Surgical Date 09/29/14   Next MD Visit  12/30/14   Observation/Other Assessments   Focus on Therapeutic Outcomes (FOTO)  55% limited   AROM   AROM Assessment Site Knee   Right/Left Knee Right   Strength   Right Hip Flexion 5/5   Right Hip Extension 5/5   Right Hip ABduction 5/5   Right Knee Flexion 5/5   Right Knee Extension 5/5  fair eccentric Rt quad control on 8" step                     [facility] Adult PT Treatment/Exercise - 12/29/14 0001    Knee/Hip Exercises: Aerobic   Stationary Bike L7 x 6'   Knee/Hip Exercises: Standing   Step Down Right;Step Height: 8";3 sets;10 reps  focus on eccentric contraction   SLS 2x10 with FWD lean   Other Standing Knee Exercises 3x15 hipo flexion in standing using green band   Moist Heat Therapy   Number Minutes Moist Heat 15 Minutes   Moist Heat Location Knee  posterior   Cryotherapy   Number Minutes Cryotherapy 15 Minutes   Cryotherapy Location Knee  Rt anterior knee   Type of Cryotherapy Ice pack  Astronomer IFC   Electrical Stimulation Parameters to tolerance   Electrical Stimulation Goals Pain   Ultrasound   Ultrasound Location posterior Rt knee   Ultrasound Parameters 100%, 3.44mz, 1.5w/cm2   Ultrasound Goals Pain                  PT Short Term Goals - 11/25/14 1049    PT SHORT TERM GOAL #1   Title I with Knee ROM exercise   Status Achieved   PT SHORT TERM GOAL #2   Title increase Rt knee extension to -8 degrees   Status Achieved   PT SHORT TERM GOAL #3   Title increase Rt knee flexion 115 degrees   Status Achieved   PT SHORT TERM GOAL #4   Title ambulate without assistive device on even surfaces   Status Achieved   PT SHORT TERM GOAL #5   Title reduce pain =/> 50% with dialy activity   Status Achieved           PT Long Term Goals - 12/29/14 0855    PT LONG TERM GOAL #1   Title I with advanced HEP ( 01/01/15)   Status Achieved   PT LONG TERM  GOAL #2   Title ambulate on even/uneven surfaces and stairs with normalized gait ( 01/01/15)   Status Partially Met  some difficulty on uneven grassy surfaces.    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    PT LONG TERM GOAL #8   Title I without difficulty stepping Rt LE in/out of his tub ([phone]   Status Achieved               Plan - 12/29/14 0944    Clinical Impression Statement Pt continues to do well, he is somewhat limited due to pain in his knee. He has met most of the goals set for him and is progressing to the goals he hasn't met.  If OK with MD will plan on D/C to HEP   PT Next Visit Plan Pt sees MD tomorrow, will see what he recommends   Consulted and Agree with Plan of Care Patient        Problem List Patient Active Problem List   Diagnosis Date Noted  . Obese 09/30/2014  . S/P right TKA 09/29/2014  . S/P knee replacement 09/29/2014  . Excessive cerumen in ear canal 09/02/2014  . Alcohol use 07/06/2014  . Osteoarthritis of right knee 05/26/2014  . Erectile dysfunction 05/26/2014  . Numbness and tingling in left arm 07/02/2013  . Sensation of fullness in left ear 05/02/2013  . Back pain 05/02/2013  . GERD (gastroesophageal reflux  disease) 10/02/2012  . Umbilical hernia 83/01/3542  . Hypertension 07/29/2012  . Hyperlipidemia 07/29/2012  . Obesity (BMI 30-39.9) 07/29/2012    Jeral Pinch,  PT 12/29/2014, 9:59 AM  [facility] [place], [place], [address] Phone: [phone]   Fax:  [phone]

## 2014-12-30 ENCOUNTER — Encounter: Payer: Self-pay | Admitting: Physical Therapy

## 2015-01-04 ENCOUNTER — Ambulatory Visit (INDEPENDENT_AMBULATORY_CARE_PROVIDER_SITE_OTHER): Payer: PRIVATE HEALTH INSURANCE | Admitting: Physical Therapy

## 2015-01-04 DIAGNOSIS — M25661 Stiffness of right knee, not elsewhere classified: Secondary | ICD-10-CM | POA: Diagnosis not present

## 2015-01-04 DIAGNOSIS — M6281 Muscle weakness (generalized): Secondary | ICD-10-CM | POA: Diagnosis not present

## 2015-01-04 DIAGNOSIS — R269 Unspecified abnormalities of gait and mobility: Secondary | ICD-10-CM

## 2015-01-04 DIAGNOSIS — M25561 Pain in right knee: Secondary | ICD-10-CM

## 2015-01-04 NOTE — Therapy (Signed)
[place], [place], [address] Phone: [phone]   Fax:  [phone]  Physical Therapy Treatment  Patient Details  Name: Jim Hunter MRN: [id number] Date of Birth: 09/22/1965 Referring Provider:  Paralee Cancel, MD  Encounter Date: 01/04/2015      PT End of Session - 01/04/15 1018    Visit Number 23   Number of Visits 30   Date for PT Re-Evaluation 01/29/15   PT Start Time 0933   PT Stop Time 1026   PT Time Calculation (min) 53 min   Activity Tolerance Patient tolerated treatment well;No increased pain      Past Medical History  Diagnosis Date  . Hypertension   . Hyperlipidemia   . Arthritis   . Sleep apnea 3 years ago    no borderline no cpap needed per sleep studty done  . GERD (gastroesophageal reflux disease)   . PPD screening test last 9 years ago first started as child    treated as child for positive ppd test    Past Surgical History  Procedure Laterality Date  . Rotator cuff repair  2010    left  . Lumbar disc surgery  2006  . Tympanoplasty      age 56  . Total knee arthroplasty Right 09/29/2014    Procedure: RIGHT TOTAL KNEE ARTHROPLASTY;  Surgeon: Paralee Cancel, MD;  Location: [facility];  Service: Orthopedics;  Laterality: Right;    There were no vitals filed for this visit.  Visit Diagnosis:  Stiffness of knee joint, right  Pain in knee joint, right  Muscle weakness (generalized)  Abnormality of gait      Subjective Assessment - 01/04/15 0934    Subjective Pt had MD visit; extended therapy 1 month and d/c use of SPC and pain medicine. Pt notes he has buckling of Rt knee with descending stairs. Reports he is walking and only working on Rt knee ROM for HEP, no strengthening exercises.    Currently in Pain? No/denies  up to 6/10 with "bites of pain"    Pain Location Knee   Pain Orientation Right   Aggravating Factors  squatting    Pain Relieving Factors rest, ice              [facility] PT Assessment - 01/04/15 0001    Assessment   Medical Diagnosis Rt TKR   Onset Date/Surgical Date 09/29/14   Next MD Visit 01/29/15   AROM   AROM Assessment Site Knee   Right/Left Knee Right   Right Knee Extension -3   Right Knee Flexion 120                     [facility] Adult PT Treatment/Exercise - 01/04/15 0001    Knee/Hip Exercises: Aerobic   Elliptical L3: 8 min    Knee/Hip Exercises: Standing   Step Down Right;3 sets;10 reps;Hand Hold: 1;Step Height: 6"   Other Standing Knee Exercises Standing forward Rt lunge to increase ROM, followed by Rt hamstring stretch x 20 sec each direction x 6 reps    Other Standing Knee Exercises 6# over head shelf to/from waist level shelf x 10 reps x 3 sets. Sled push 85#  80 ft pull/push x 2 reps; sled push/pull with 95# 80 ft each way.  35# box waist to floor x 5 reps x 3 sets.    Knee/Hip Exercises: Seated   Other Seated Knee Exercises Rt kneeling squat to increase Rt knee flexion (  to 120 deg). x 30 sec hold x 3 reps.    Modalities   Modalities Cryotherapy;Moist Heat   Moist Heat Therapy   Number Minutes Moist Heat 12 Minutes   Moist Heat Location Knee   Cryotherapy   Number Minutes Cryotherapy 12 Minutes   Cryotherapy Location Knee   Type of Cryotherapy Ice pack                PT Education - 01/04/15 1046    Education provided Yes   Education Details HEP- encouraged pt to complete Lt heel taps and Rt knee flexion ROM.    Person(s) Educated Patient;Spouse   Methods Explanation   Comprehension Verbalized understanding;Returned demonstration          PT Short Term Goals - 11/25/14 1049    PT SHORT TERM GOAL #1   Title I with Knee ROM exercise   Status Achieved   PT SHORT TERM GOAL #2   Title increase Rt knee extension to -8 degrees   Status Achieved   PT SHORT TERM GOAL #3   Title increase Rt knee flexion 115 degrees   Status Achieved   PT SHORT TERM GOAL #4   Title ambulate without  assistive device on even surfaces   Status Achieved   PT SHORT TERM GOAL #5   Title reduce pain =/> 50% with dialy activity   Status Achieved           PT Long Term Goals - 12/29/14 0855    PT LONG TERM GOAL #1   Title I with advanced HEP ( 01/01/15)   Status Achieved   PT LONG TERM GOAL #2   Title ambulate on even/uneven surfaces and stairs with normalized gait ( 01/01/15)   Status Partially Met  some difficulty on uneven grassy surfaces.    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    PT LONG TERM GOAL #8   Title I without difficulty stepping Rt LE in/out of his tub [phone])   Status Achieved               Plan - 01/04/15 1033    Clinical Impression Statement Pt received Rx from MD for 4 more wks of continued therapy for ROM, strengthening, and work conditioning (see note in file).  Pt tolerated all exercises and work-like circuit without increase in pain. Rt knee ROM remains unchanged from last session; will push in HEP.  Pt notes decreased "biting" pain with use of ice/MHP at end of session.    Pt will benefit from skilled therapeutic intervention in order to improve on the following deficits Abnormal gait;Decreased range of motion;Difficulty walking;Impaired flexibility;Decreased skin integrity;Pain;Decreased mobility;Decreased strength;Decreased balance   Rehab Potential Excellent  PT Frequency 2x / week    PT Duration 4 weeks   PT Treatment/Interventions Moist Heat;Therapeutic activities;Patient/family education;Scar mobilization;Passive range of motion;Therapeutic exercise;DME Instruction;Ultrasound;Gait training;Balance training;Manual techniques;Neuromuscular re-education;Stair training;Cryotherapy;Electrical Stimulation   PT Next Visit Plan Continued ROM for Rt knee, Rt quad eccentric exercises, and simulated work exercises to aid in return to work.    Consulted and Agree with Plan of Care Patient        Problem List Patient Active Problem List   Diagnosis Date Noted  . Obese 09/30/2014  . S/P right TKA 09/29/2014  . S/P knee replacement 09/29/2014  . Excessive cerumen in ear canal 09/02/2014  . Alcohol use 07/06/2014  . Osteoarthritis of right knee 05/26/2014  . Erectile dysfunction 05/26/2014  . Numbness and tingling in left arm 07/02/2013  . Sensation of fullness in left ear 05/02/2013  . Back pain 05/02/2013  . GERD (gastroesophageal reflux disease) 10/02/2012  . Umbilical hernia 15/10/1362  . Hypertension 07/29/2012  . Hyperlipidemia 07/29/2012  . Obesity (BMI 30-39.9) 07/29/2012    Kerin Perna, PTA 01/04/2015 10:52 AM [place] [place], [place], [address] Phone: [phone]   Fax:  [phone]

## 2015-01-05 ENCOUNTER — Ambulatory Visit (INDEPENDENT_AMBULATORY_CARE_PROVIDER_SITE_OTHER): Payer: PRIVATE HEALTH INSURANCE | Admitting: Physical Therapy

## 2015-01-05 DIAGNOSIS — M25561 Pain in right knee: Secondary | ICD-10-CM | POA: Diagnosis not present

## 2015-01-05 DIAGNOSIS — R269 Unspecified abnormalities of gait and mobility: Secondary | ICD-10-CM | POA: Diagnosis not present

## 2015-01-05 DIAGNOSIS — M25661 Stiffness of right knee, not elsewhere classified: Secondary | ICD-10-CM | POA: Diagnosis not present

## 2015-01-05 DIAGNOSIS — M6281 Muscle weakness (generalized): Secondary | ICD-10-CM

## 2015-01-05 NOTE — Therapy (Signed)
[place], [place], [address] Phone: [phone]   Fax:  [phone]  Physical Therapy Treatment  Patient Details  Name: Jim Hunter MRN: [id number] Date of Birth: May 26, 1966 Referring Provider:  Paralee Cancel, MD  Encounter Date: 01/05/2015      PT End of Session - 01/05/15 0803    Visit Number 24   Number of Visits 30   Date for PT Re-Evaluation 01/29/15   PT Start Time 0800   PT Stop Time 0843   PT Time Calculation (min) 43 min   Activity Tolerance Patient tolerated treatment well      Past Medical History  Diagnosis Date  . Hypertension   . Hyperlipidemia   . Arthritis   . Sleep apnea 3 years ago    no borderline no cpap needed per sleep studty done  . GERD (gastroesophageal reflux disease)   . PPD screening test last 9 years ago first started as child    treated as child for positive ppd test    Past Surgical History  Procedure Laterality Date  . Rotator cuff repair  2010    left  . Lumbar disc surgery  2006  . Tympanoplasty      age 45  . Total knee arthroplasty Right 09/29/2014    Procedure: RIGHT TOTAL KNEE ARTHROPLASTY;  Surgeon: Paralee Cancel, MD;  Location: [facility];  Service: Orthopedics;  Laterality: Right;    There were no vitals filed for this visit.  Visit Diagnosis:  Stiffness of knee joint, right  Pain in knee joint, right  Muscle weakness (generalized)  Abnormality of gait      Subjective Assessment - 01/05/15 0804    Subjective "The pain bites are lasting longer these days".   Pt reports that his Rt knee was not as painful in afternoon as previous session.    Currently in Pain? No/denies  Up to 4/10 sporadically            [facility] PT Assessment - 01/05/15 0001    Assessment   Medical Diagnosis Rt TKR   Onset Date/Surgical Date 09/29/14   Next MD Visit 01/29/15   AROM   AROM Assessment Site Knee   Right/Left Knee Right   Right Knee Flexion 121   after kneeling squat                     [facility] Adult PT Treatment/Exercise - 01/05/15 0001    Knee/Hip Exercises: Stretches   Passive Hamstring Stretch Right;2 reps;30 seconds   Quad Stretch Right;2 reps;30 seconds   Gastroc Stretch Right;2 reps;20 seconds   Knee/Hip Exercises: Aerobic   Elliptical L3.5 x 5 min    Knee/Hip Exercises: Standing   Heel Raises Right;2 sets;15 reps   Lateral Step Up Right;1 set;10 reps   SLS On Bosu x 20 sec x 2; upside down Bosu x 15 sec x 2.   Forward leans with cone pick up off of step x 10 each leg    Other Standing Knee Exercises sled push/pull 85# x 160 ft each way.  40# box lift floor to waist level x 5 reps.  40# box waist to waist 4 ft carry x 5 reps  50# cart push 80 ft.    Other Standing Knee Exercises 11# overhead to waist level transfer x 10 x 2 sets.    Knee/Hip Exercises: Seated   Heel Slides AAROM;Right;5 reps   Other Seated Knee/Hip Exercises Rt kneeling squat  to increase Rt knee flexion (to 120 deg). x 30 sec hold x 5 reps.    Modalities   Modalities --  Pt declined modalites due to time constraints.                PT Education - 01/04/15 1046    Education provided Yes   Education Details HEP- encouraged pt to complete Lt heel taps and Rt knee flexion ROM.    Person(s) Educated Patient;Spouse   Methods Explanation   Comprehension Verbalized understanding;Returned demonstration          PT Short Term Goals - 11/25/14 1049    PT SHORT TERM GOAL #1   Title I with Knee ROM exercise   Status Achieved   PT SHORT TERM GOAL #2   Title increase Rt knee extension to -8 degrees   Status Achieved   PT SHORT TERM GOAL #3   Title increase Rt knee flexion 115 degrees   Status Achieved   PT SHORT TERM GOAL #4   Title ambulate without assistive device on even surfaces   Status Achieved   PT SHORT TERM GOAL #5   Title reduce pain =/> 50% with dialy activity   Status Achieved           PT Long Term Goals -  12/29/14 0855    PT LONG TERM GOAL #1   Title I with advanced HEP ( 01/01/15)   Status Achieved   PT LONG TERM GOAL #2   Title ambulate on even/uneven surfaces and stairs with normalized gait ( 01/01/15)   Status Partially Met  some difficulty on uneven grassy surfaces.    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    PT LONG TERM GOAL #8   Title I without difficulty stepping Rt LE in/out of his tub ([id number])   Status Achieved               Plan - 01/05/15 0840    Clinical Impression Statement Pt gained 1-2 degrees of Rt knee flexion with ROM stretches today.  Improving strength and endurance.    Pt will benefit from skilled therapeutic intervention in order to improve on the following deficits Abnormal gait;Decreased range of motion;Difficulty walking;Impaired flexibility;Decreased skin integrity;Pain;Decreased mobility;Decreased strength;Decreased balance   Rehab Potential Excellent   PT Frequency 2x / week   PT Duration 4 weeks   PT Treatment/Interventions Moist Heat;Therapeutic activities;Patient/family education;Scar mobilization;Passive range of motion;Therapeutic exercise;DME Instruction;Ultrasound;Gait training;Balance training;Manual techniques;Neuromuscular re-education;Stair training;Cryotherapy;Electrical Stimulation   PT Next Visit Plan Continued ROM for Rt knee, Rt quad eccentric exercises, and simulated work  exercises to aid in return to work.    Consulted and Agree with Plan of Care Patient  Problem List Patient Active Problem List   Diagnosis Date Noted  . Obese 09/30/2014  . S/P right TKA 09/29/2014  . S/P knee replacement 09/29/2014  . Excessive cerumen in ear canal 09/02/2014  . Alcohol use 07/06/2014  . Osteoarthritis of right knee 05/26/2014  . Erectile dysfunction 05/26/2014  . Numbness and tingling in left arm 07/02/2013  . Sensation of fullness in left ear 05/02/2013  . Back pain 05/02/2013  . GERD (gastroesophageal reflux disease) 10/02/2012  . Umbilical hernia 49/17/9150  . Hypertension 07/29/2012  . Hyperlipidemia 07/29/2012  . Obesity (BMI 30-39.9) 07/29/2012    Kerin Perna, PTA 01/05/2015 8:44 AM  [place], [place], [address] Phone: [phone]   Fax:  [phone]

## 2015-01-06 ENCOUNTER — Encounter: Payer: Self-pay | Admitting: Physical Therapy

## 2015-01-14 ENCOUNTER — Ambulatory Visit (INDEPENDENT_AMBULATORY_CARE_PROVIDER_SITE_OTHER): Payer: PRIVATE HEALTH INSURANCE | Admitting: Physical Therapy

## 2015-01-14 DIAGNOSIS — M6281 Muscle weakness (generalized): Secondary | ICD-10-CM

## 2015-01-14 DIAGNOSIS — M25661 Stiffness of right knee, not elsewhere classified: Secondary | ICD-10-CM

## 2015-01-14 DIAGNOSIS — M25561 Pain in right knee: Secondary | ICD-10-CM | POA: Diagnosis not present

## 2015-01-14 DIAGNOSIS — R269 Unspecified abnormalities of gait and mobility: Secondary | ICD-10-CM | POA: Diagnosis not present

## 2015-01-14 DIAGNOSIS — M7989 Other specified soft tissue disorders: Secondary | ICD-10-CM

## 2015-01-14 NOTE — Therapy (Signed)
[place], [place], [address] Phone: [phone]   Fax:  [phone]  Physical Therapy Treatment  Patient Details  Name: Jim Hunter MRN: [id number] Date of Birth: Jun 09, 1966 Referring Provider:  Paralee Cancel, MD  Encounter Date: 01/14/2015      PT End of Session - 01/14/15 1537    Visit Number 25   Number of Visits 30   Date for PT Re-Evaluation 01/29/15   PT Start Time 3846   PT Stop Time 1635   PT Time Calculation (min) 65 min   Activity Tolerance Patient tolerated treatment well      Past Medical History  Diagnosis Date  . Hypertension   . Hyperlipidemia   . Arthritis   . Sleep apnea 3 years ago    no borderline no cpap needed per sleep studty done  . GERD (gastroesophageal reflux disease)   . PPD screening test last 9 years ago first started as child    treated as child for positive ppd test    Past Surgical History  Procedure Laterality Date  . Rotator cuff repair  2010    left  . Lumbar disc surgery  2006  . Tympanoplasty      age 40  . Total knee arthroplasty Right 09/29/2014    Procedure: RIGHT TOTAL KNEE ARTHROPLASTY;  Surgeon: Paralee Cancel, MD;  Location: [facility];  Service: Orthopedics;  Laterality: Right;    There were no vitals filed for this visit.  Visit Diagnosis:  Stiffness of knee joint, right  Pain in knee joint, right  Muscle weakness (generalized)  Abnormality of gait  Swelling of limb      Subjective Assessment - 01/14/15 1537    Subjective Pt reports he still has pain (1-2/10) with sit to stand, and to straighten leg. "The longer I sit the worse it gets".  Pt warmed up on bike and eliptical, knee bends prior to treatment.    Currently in Pain? Yes   Pain Score 2    Pain Location Knee   Pain Orientation Right   Pain Descriptors / Indicators Sharp;Shooting   Pain Frequency Constant   Aggravating Factors  squatting, bending over   Pain Relieving  Factors rest, ice            [facility] PT Assessment - 01/14/15 0001    Assessment   Medical Diagnosis Rt TKR   Onset Date/Surgical Date 09/29/14   Next MD Visit 01/29/15   AROM   AROM Assessment Site Knee   Right/Left Knee Right   Right Knee Extension -3   Right Knee Flexion 121  after kneeling squat                     [facility] Adult PT Treatment/Exercise - 01/14/15 0001    Knee/Hip Exercises: Machines for Strengthening   Cybex Knee Extension 3 plates x 10 reps, 2 sets   Total Gym Leg Press 8 plates RLE x 15, x 2 sets   Knee/Hip Exercises: Standing   SLS On upside down BOSU x 30 sec each leg, x 2 sets; with horiz head turns x 10    Knee/Hip Exercises: Seated   Other Seated Knee/Hip Exercises Rt kneeling squat to increase Rt knee flexion (to 120 deg). x 30 sec hold x 5 reps.    Knee/Hip Exercises: Supine   Other Supine Knee/Hip Exercises Hamstring curl with bridge, LE on therapy ball x 2 sets of 10 (challenging), then reverse  curl with bridge x 10. VC not to compensate with LLE.   Knee/Hip Exercises: Prone   Other Prone Exercises Opp arm and leg extension x 10 reps each side.    Modalities   Modalities Cryotherapy;Moist Heat;Electrical Stimulation   Moist Heat Therapy   Number Minutes Moist Heat 15 Minutes   Moist Heat Location Knee  posterior aspect of Rt   Cryotherapy   Number Minutes Cryotherapy 15 Minutes   Cryotherapy Location Knee  anterior    Type of Cryotherapy Ice pack   Electrical Stimulation   Electrical Stimulation Location Rt knee    Electrical Stimulation Action IFC   Electrical Stimulation Parameters to tolerance, x 15 min    Electrical Stimulation Goals Pain                  PT Short Term Goals - 11/25/14 1049    PT SHORT TERM GOAL #1   Title I with Knee ROM exercise   Status Achieved   PT SHORT TERM GOAL #2   Title increase Rt knee extension to -8 degrees   Status Achieved   PT SHORT TERM GOAL #3   Title increase Rt knee  flexion 115 degrees   Status Achieved   PT SHORT TERM GOAL #4   Title ambulate without assistive device on even surfaces   Status Achieved   PT SHORT TERM GOAL #5   Title reduce pain =/> 50% with dialy activity   Status Achieved           PT Long Term Goals - 12/29/14 0855    PT LONG TERM GOAL #1   Title I with advanced HEP ( 01/01/15)   Status Achieved   PT LONG TERM GOAL #2   Title ambulate on even/uneven surfaces and stairs with normalized gait ( 01/01/15)   Status Partially Met  some difficulty on uneven grassy surfaces.    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    PT LONG TERM GOAL #8   Title I without difficulty stepping Rt LE in/out of his tub (83338)   Status Achieved               Plan - 01/14/15 1645    Clinical Impression Statement Pt's Rt knee ROM remains unchanged from last session despite efforts from pt AAROM and manual therapy.  Pt continues to improve with endurance and strength. Pt will benefit from continued PT intervention to maximize functional mobility and return to work.    Pt will benefit from skilled therapeutic intervention in order to improve on the following deficits Abnormal gait;Decreased range of motion;Difficulty walking;Impaired flexibility;Decreased skin integrity;Pain;Decreased mobility;Decreased strength;Decreased balance   Rehab Potential Excellent   PT Frequency 2x /  week   PT Duration 4 weeks   PT Treatment/Interventions Moist Heat;Therapeutic activities;Patient/family education;Scar mobilization;Passive range of motion;Therapeutic exercise;DME Instruction;Ultrasound;Gait training;Balance training;Manual techniques;Neuromuscular re-education;Stair training;Cryotherapy;Electrical Stimulation   PT Next Visit Plan Continued ROM for Rt knee, Rt quad eccentric exercises, and simulated work exercises to aid in return to work.    Consulted and Agree with Plan of Care Patient        Problem List Patient Active Problem List   Diagnosis Date Noted  . Obese 09/30/2014  . S/P right TKA 09/29/2014  . S/P knee replacement 09/29/2014  . Excessive cerumen in ear canal 09/02/2014  . Alcohol use 07/06/2014  . Osteoarthritis of right knee 05/26/2014  . Erectile dysfunction 05/26/2014  . Numbness and tingling in left arm 07/02/2013  . Sensation of fullness in left ear 05/02/2013  . Back pain 05/02/2013  . GERD (gastroesophageal reflux disease) 10/02/2012  . Umbilical hernia 86/85/4883  . Hypertension 07/29/2012  . Hyperlipidemia 07/29/2012  . Obesity (BMI 30-39.9) 07/29/2012   Kerin Perna, PTA 01/14/2015 4:56 PM  [facility] [place], [place], [address] Phone: [phone]   Fax:  [phone]

## 2015-01-15 ENCOUNTER — Ambulatory Visit (INDEPENDENT_AMBULATORY_CARE_PROVIDER_SITE_OTHER): Payer: PRIVATE HEALTH INSURANCE | Admitting: Physical Therapy

## 2015-01-15 DIAGNOSIS — R269 Unspecified abnormalities of gait and mobility: Secondary | ICD-10-CM

## 2015-01-15 DIAGNOSIS — M6281 Muscle weakness (generalized): Secondary | ICD-10-CM | POA: Diagnosis not present

## 2015-01-15 DIAGNOSIS — M25661 Stiffness of right knee, not elsewhere classified: Secondary | ICD-10-CM | POA: Diagnosis not present

## 2015-01-15 DIAGNOSIS — M25561 Pain in right knee: Secondary | ICD-10-CM

## 2015-01-15 NOTE — Therapy (Signed)
[place], [place], [address] Phone: [phone]   Fax:  [phone]  Physical Therapy Treatment  Patient Details  Name: Jim Hunter MRN: [id number] Date of Birth: 11/02/1965 Referring Provider:  Paralee Cancel, MD  Encounter Date: 01/15/2015      PT End of Session - 01/15/15 0859    Visit Number 26   Number of Visits 30   Date for PT Re-Evaluation 01/29/15   PT Start Time 1696   PT Stop Time 0948   PT Time Calculation (min) 56 min      Past Medical History  Diagnosis Date  . Hypertension   . Hyperlipidemia   . Arthritis   . Sleep apnea 3 years ago    no borderline no cpap needed per sleep studty done  . GERD (gastroesophageal reflux disease)   . PPD screening test last 9 years ago first started as child    treated as child for positive ppd test    Past Surgical History  Procedure Laterality Date  . Rotator cuff repair  2010    left  . Lumbar disc surgery  2006  . Tympanoplasty      age 24  . Total knee arthroplasty Right 09/29/2014    Procedure: RIGHT TOTAL KNEE ARTHROPLASTY;  Surgeon: Paralee Cancel, MD;  Location: [facility];  Service: Orthopedics;  Laterality: Right;    There were no vitals filed for this visit.  Visit Diagnosis:  Stiffness of knee joint, right  Pain in knee joint, right  Muscle weakness (generalized)  Abnormality of gait      Subjective Assessment - 01/15/15 0856    Subjective Pt reports he did not have the pain after therapy like he has in the past.  Nothing new to report, Rt knee still has "bites" at distal medial thigh and proximal lateral lower leg (near joint line) with any amount of sitting.     Currently in Pain? No/denies; up to 6/10 during therapy session.            [facility] PT Assessment - 01/15/15 0001    Assessment   Medical Diagnosis Rt TKR   Onset Date/Surgical Date 09/29/14   Next MD Visit 01/29/15   AROM   AROM Assessment Site Knee   Right/Left Knee Right   Right Knee Flexion 123  after kneeling exercise                     [facility] Adult PT Treatment/Exercise - 01/15/15 0001    Knee/Hip Exercises: Aerobic   Elliptical L3: 2 min forward, 1 min backward, 2 min forward    Knee/Hip Exercises: Standing   Other Standing Knee Exercises 35# box lift waist level to/from floor x 8; 35# box  carry 4 ft x 5 reps    Knee/Hip Exercises: Seated   Other Seated Knee/Hip Exercises Rt kneeling to standing x 10 reps, Lt kneeling to standing x 10 reps to work strength and flexibility.  Rt kneeling with forward lunging forward to increase Rt knee ROM.  Multiple attempts at childs pose - unable to increase hip flexion/difficult tolerating.    Knee/Hip Exercises: Prone   Hamstring Curl 2 sets;10 reps  7.5#, with 50 min prone hang between sets   Modalities   Modalities Cryotherapy;Moist Heat;Electrical Stimulation   Moist Heat Therapy   Number Minutes Moist Heat 15 Minutes   Moist Heat Location Knee  posterior aspect of Rt   Cryotherapy   Number  Minutes Cryotherapy 50 Minutes   Cryotherapy Location Knee  anterior    Type of Cryotherapy Ice pack   Electrical Stimulation   Electrical Stimulation Location Rt knee    Electrical Stimulation Action IFC   Electrical Stimulation Parameters to tolerance    Electrical Stimulation Goals Pain                  PT Short Term Goals - 11/25/14 1049    PT SHORT TERM GOAL #1   Title I with Knee ROM exercise   Status Achieved   PT SHORT TERM GOAL #2   Title increase Rt knee extension to -8 degrees   Status Achieved   PT SHORT TERM GOAL #3   Title increase Rt knee flexion 115 degrees   Status Achieved   PT SHORT TERM GOAL #4   Title ambulate without assistive device on even surfaces   Status Achieved   PT SHORT TERM GOAL #5   Title reduce pain =/> 50% with dialy activity   Status Achieved           PT Long Term Goals - 12/29/14 0855    PT LONG TERM GOAL #1    Title I with advanced HEP ( 01/01/15)   Status Achieved   PT LONG TERM GOAL #2   Title ambulate on even/uneven surfaces and stairs with normalized gait ( 01/01/15)   Status Partially Met  some difficulty on uneven grassy surfaces.    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    PT LONG TERM GOAL #8   Title I without difficulty stepping Rt LE in/out of his tub ([id number])   Status Achieved               Plan - 01/15/15 0925    Clinical Impression Statement Pt demo 2 degree gain in Rt knee flexion after kneeling ROM exercise. Pt continues to have antalgic gait; reports "stiffness".  Pt will benefit from continued PT intervention to maximize functional mobility and return to work.    Pt will benefit from skilled therapeutic intervention in order to improve on the following deficits Abnormal gait;Decreased range of motion;Difficulty walking;Impaired flexibility;Decreased skin integrity;Pain;Decreased mobility;Decreased strength;Decreased balance   Rehab Potential Excellent   PT Frequency 2x / week   PT Duration 4 weeks   PT Treatment/Interventions Moist Heat;Therapeutic activities;Patient/family education;Scar mobilization;Passive range of motion;Therapeutic exercise;DME Instruction;Ultrasound;Gait training;Balance training;Manual techniques;Neuromuscular re-education;Stair training;Cryotherapy;Electrical Stimulation   PT Next  Visit Plan Continued ROM for Rt knee, Rt quad eccentric exercises, and simulated work exercises to aid in return to work. May try gait with uneven surfaces.    Consulted and Agree with Plan of Care Patient        Problem List  Patient Active Problem List   Diagnosis Date Noted  . Obese 09/30/2014  . S/P right TKA 09/29/2014  . S/P knee replacement 09/29/2014  . Excessive cerumen in ear canal 09/02/2014  . Alcohol use 07/06/2014  . Osteoarthritis of right knee 05/26/2014  . Erectile dysfunction 05/26/2014  . Numbness and tingling in left arm 07/02/2013  . Sensation of fullness in left ear 05/02/2013  . Back pain 05/02/2013  . GERD (gastroesophageal reflux disease) 10/02/2012  . Umbilical hernia 11/65/4612  . Hypertension 07/29/2012  . Hyperlipidemia 07/29/2012  . Obesity (BMI 30-39.9) 07/29/2012   Kerin Perna, PTA 01/15/2015 9:43 AM  [facility] [place], [place], [address] Phone: [phone]   Fax:  [phone]

## 2015-01-20 ENCOUNTER — Ambulatory Visit (INDEPENDENT_AMBULATORY_CARE_PROVIDER_SITE_OTHER): Payer: PRIVATE HEALTH INSURANCE | Admitting: Physical Therapy

## 2015-01-20 DIAGNOSIS — R269 Unspecified abnormalities of gait and mobility: Secondary | ICD-10-CM

## 2015-01-20 DIAGNOSIS — M25661 Stiffness of right knee, not elsewhere classified: Secondary | ICD-10-CM

## 2015-01-20 DIAGNOSIS — M6281 Muscle weakness (generalized): Secondary | ICD-10-CM | POA: Diagnosis not present

## 2015-01-20 DIAGNOSIS — M25561 Pain in right knee: Secondary | ICD-10-CM | POA: Diagnosis not present

## 2015-01-20 DIAGNOSIS — M7989 Other specified soft tissue disorders: Secondary | ICD-10-CM

## 2015-01-20 NOTE — Therapy (Signed)
[place], [place], [address] Phone: [phone]   Fax:  [phone]  Physical Therapy Treatment  Patient Details  Name: Jim Hunter MRN: [id number] Date of Birth: [birth year] Referring Provider:  Paralee Cancel, MD  Encounter Date: 01/20/2015      PT End of Session - 01/20/15 0927    Visit Number 27   Number of Visits 30   Date for PT Re-Evaluation 01/29/15   PT Start Time 0845   PT Stop Time 0945   PT Time Calculation (min) 60 min   Activity Tolerance Patient tolerated treatment well   Behavior During Therapy [facility] for tasks assessed/performed      Past Medical History  Diagnosis Date  . Hypertension   . Hyperlipidemia   . Arthritis   . Sleep apnea 3 years ago    no borderline no cpap needed per sleep studty done  . GERD (gastroesophageal reflux disease)   . PPD screening test last 9 years ago first started as child    treated as child for positive ppd test    Past Surgical History  Procedure Laterality Date  . Rotator cuff repair  2010    left  . Lumbar disc surgery  2006  . Tympanoplasty      age 49  . Total knee arthroplasty Right 09/29/2014    Procedure: RIGHT TOTAL KNEE ARTHROPLASTY;  Surgeon: Paralee Cancel, MD;  Location: [facility];  Service: Orthopedics;  Laterality: Right;    There were no vitals filed for this visit.  Visit Diagnosis:  Stiffness of knee joint, right  Pain in knee joint, right  Muscle weakness (generalized)  Abnormality of gait  Swelling of limb      Subjective Assessment - 01/20/15 0848    Subjective Having some stiffness with pain.  Has been stretching knee a lot and thinks that's why it hurts.   Pertinent History Pt initially saw MD and had a scope for tears ACL and meniscus.  Had cortisone shots, Pain returned 2 yrs later worse. Had to start all over with injections, never had PT. was bone on bone and failed conservative tx   Patient Stated Goals Pt  wishes to return to his normal activities, walk and hike. Work   Currently in Pain? Yes   Pain Score 3    Pain Location Knee   Pain Orientation Right   Pain Descriptors / Indicators Sharp   Pain Type Surgical pain   Pain Onset More than a month ago   Pain Frequency Intermittent   Aggravating Factors  activity, stretching   Pain Relieving Factors rest, ice                         [facility] Adult PT Treatment/Exercise - 01/20/15 0849    Knee/Hip Exercises: Aerobic   Elliptical Level 2.5 x 6 min; 3 min forward/3 min backward   Knee/Hip Exercises: Standing   Other Standing Knee Exercises 35# box  carry 20 ft x 10 reps    Knee/Hip Exercises: Seated   Other Seated Knee/Hip Exercises Rt kneeling to standing x 10 reps, Lt kneeling to standing x 10 reps to work strength and flexibility.  Rt kneeling with forward lunging forward to increase Rt knee ROM.     Knee/Hip Exercises: Prone   Hamstring Curl 3 sets;10 reps   Hamstring Curl Limitations 5# with prone hang x 2 min between  PT Short Term Goals - 11/25/14 1049    PT SHORT TERM GOAL #1   Title I with Knee ROM exercise   Status Achieved   PT SHORT TERM GOAL #2   Title increase Rt knee extension to -8 degrees   Status Achieved   PT SHORT TERM GOAL #3   Title increase Rt knee flexion 115 degrees   Status Achieved   PT SHORT TERM GOAL #4   Title ambulate without assistive device on even surfaces   Status Achieved   PT SHORT TERM GOAL #5   Title reduce pain =/> 50% with dialy activity   Status Achieved           PT Long Term Goals - 12/29/14 0855    PT LONG TERM GOAL #1   Title I with advanced HEP ( 01/01/15)   Status Achieved   PT LONG TERM GOAL #2   Title ambulate on even/uneven surfaces and stairs with normalized gait ( 01/01/15)   Status Partially Met  some difficulty on uneven grassy surfaces.    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    PT LONG TERM GOAL #8   Title I without difficulty stepping Rt LE in/out of his tub [phone])   Status Achieved               Plan - 01/20/15 0927    Clinical Impression Statement Tolerated work related activities well today.  Discussed possible need for pillow for kneeling at work on concrete.   PT Next Visit Plan Continued ROM for Rt knee, Rt quad eccentric exercises, and simulated work exercises to aid in return to work. May try gait with uneven surfaces.         Problem List Patient Active Problem List   Diagnosis Date Noted  . Obese 09/30/2014  . S/P right TKA 09/29/2014  . S/P knee replacement 09/29/2014  . Excessive cerumen in ear canal 09/02/2014  . Alcohol use 07/06/2014  . Osteoarthritis of right knee 05/26/2014  . Erectile dysfunction 05/26/2014  . Numbness and tingling in left arm 07/02/2013  . Sensation of fullness in left ear 05/02/2013  . Back pain 05/02/2013  . GERD (gastroesophageal reflux disease) 10/02/2012  . Umbilical hernia 95/28/4132  . Hypertension 07/29/2012  . Hyperlipidemia 07/29/2012  . Obesity (BMI 30-39.9) 07/29/2012   Laureen Abrahams, PT, DPT 01/20/2015 9:52 AM  [facility] [place], [place], [address] Phone: [phone]   Fax:  [phone]

## 2015-01-22 ENCOUNTER — Ambulatory Visit (INDEPENDENT_AMBULATORY_CARE_PROVIDER_SITE_OTHER): Payer: PRIVATE HEALTH INSURANCE | Admitting: Physical Therapy

## 2015-01-22 DIAGNOSIS — M6281 Muscle weakness (generalized): Secondary | ICD-10-CM

## 2015-01-22 DIAGNOSIS — M25661 Stiffness of right knee, not elsewhere classified: Secondary | ICD-10-CM | POA: Diagnosis not present

## 2015-01-22 DIAGNOSIS — R269 Unspecified abnormalities of gait and mobility: Secondary | ICD-10-CM | POA: Diagnosis not present

## 2015-01-22 DIAGNOSIS — M25561 Pain in right knee: Secondary | ICD-10-CM | POA: Diagnosis not present

## 2015-01-22 DIAGNOSIS — M7989 Other specified soft tissue disorders: Secondary | ICD-10-CM

## 2015-01-22 NOTE — Therapy (Signed)
[place], [place], [address] Phone: [phone]   Fax:  [phone]  Physical Therapy Treatment  Patient Details  Name: Jim Hunter MRN: [id number] Date of Birth: 01-10-1966 Referring Provider:  Paralee Cancel, MD  Encounter Date: 01/22/2015      PT End of Session - 01/22/15 0924    Visit Number 28   Number of Visits 30   Date for PT Re-Evaluation 01/29/15   PT Start Time 0844   PT Stop Time 0922   PT Time Calculation (min) 38 min   Activity Tolerance Patient tolerated treatment well   Behavior During Therapy [facility] for tasks assessed/performed      Past Medical History  Diagnosis Date  . Hypertension   . Hyperlipidemia   . Arthritis   . Sleep apnea 3 years ago    no borderline no cpap needed per sleep studty done  . GERD (gastroesophageal reflux disease)   . PPD screening test last 9 years ago first started as child    treated as child for positive ppd test    Past Surgical History  Procedure Laterality Date  . Rotator cuff repair  2010    left  . Lumbar disc surgery  2006  . Tympanoplasty      age 49  . Total knee arthroplasty Right 09/29/2014    Procedure: RIGHT TOTAL KNEE ARTHROPLASTY;  Surgeon: Paralee Cancel, MD;  Location: [facility];  Service: Orthopedics;  Laterality: Right;    There were no vitals filed for this visit.  Visit Diagnosis:  Stiffness of knee joint, right  Pain in knee joint, right  Muscle weakness (generalized)  Abnormality of gait  Swelling of limb      Subjective Assessment - 01/22/15 0847    Subjective Has some running around to do therefore requesting to ice at home today.   Patient Stated Goals Pt wishes to return to his normal activities, walk and hike. Work   Currently in Pain? Yes   Pain Score 2    Pain Location Knee   Pain Orientation Right   Pain Descriptors / Indicators Dull  stiff                         [facility] Adult PT  Treatment/Exercise - 01/22/15 0848    Ambulation/Gait   Ambulation/Gait Yes   Ambulation/Gait Assistance 7: Independent   Ambulation Distance (Feet) 300 Feet   Assistive device None   Gait Pattern Within Functional Limits   Ambulation Surface Unlevel;Outdoor;Grass   Gait Comments no instability noted with gait on unlevel outdoor surfaces   Knee/Hip Exercises: Aerobic   Elliptical Level 3 x 6 min; 3 min forward/3 min backward   Knee/Hip Exercises: Machines for Strengthening   Cybex Knee Extension 3 plates x 10 reps, 3 sets with focus on eccentric control   Knee/Hip Exercises: Standing   Step Down Right;2 sets;10 reps;Hand Hold: 1;Step Height: 8"   Step Down Limitations decreased eccentric control at very end of step down   Wall Squat 10 reps;5 seconds                  PT Short Term Goals - 11/25/14 1049    PT SHORT TERM GOAL #1   Title I with Knee ROM exercise   Status Achieved   PT SHORT TERM GOAL #2   Title increase Rt knee extension to -8 degrees   Status Achieved   PT SHORT  TERM GOAL #3   Title increase Rt knee flexion 115 degrees   Status Achieved   PT SHORT TERM GOAL #4   Title ambulate without assistive device on even surfaces   Status Achieved   PT SHORT TERM GOAL #5   Title reduce pain =/> 50% with dialy activity   Status Achieved           PT Long Term Goals - 01/22/15 7998    PT LONG TERM GOAL #1   Title I with advanced HEP ( 01/01/15)   Status Achieved   PT LONG TERM GOAL #2   Title ambulate on even/uneven surfaces and stairs with normalized gait ( 01/01/15)   Status Achieved   PT LONG TERM GOAL #3   Title increased strength Rt hip =/> 5/5   Status Achieved   PT LONG TERM GOAL #4   Title demo Rt knee ROM WFL  ( 01/01/15)   Status Partially Met   PT LONG TERM GOAL #5   Title improve FOTO =/< 47% limited ( 01/01/15)   Status Not Met   PT LONG TERM GOAL #6   Title lift 50# floor to waist level without trouble   Status Achieved   PT LONG TERM  GOAL #7   Title I and painfree with lifting and getting his Rt LE into his truck (01/01/15)   Status Partially Met   PT LONG TERM GOAL #8   Title I without difficulty stepping Rt LE in/out of his tub (00123)   Status Achieved               Plan - 01/22/15 0924    Clinical Impression Statement Pt demonstrates decreased eccentric quad control with negotiating down hill.  Anticipate d/c next week.   PT Next Visit Plan Give HEP for eccentric quad control, discuss d/c with pt.   Consulted and Agree with Plan of Care Patient        Problem List Patient Active Problem List   Diagnosis Date Noted  . Obese 09/30/2014  . S/P right TKA 09/29/2014  . S/P knee replacement 09/29/2014  . Excessive cerumen in ear canal 09/02/2014  . Alcohol use 07/06/2014  . Osteoarthritis of right knee 05/26/2014  . Erectile dysfunction 05/26/2014  . Numbness and tingling in left arm 07/02/2013  . Sensation of fullness in left ear 05/02/2013  . Back pain 05/02/2013  . GERD (gastroesophageal reflux disease) 10/02/2012  . Umbilical hernia 93/59/4090  . Hypertension 07/29/2012  . Hyperlipidemia 07/29/2012  . Obesity (BMI 30-39.9) 07/29/2012   Laureen Abrahams, PT, DPT 01/22/2015 9:30 AM  [facility] [place], [place], [address] Phone: [phone]   Fax:  [phone]

## 2015-01-25 ENCOUNTER — Ambulatory Visit (INDEPENDENT_AMBULATORY_CARE_PROVIDER_SITE_OTHER): Payer: PRIVATE HEALTH INSURANCE | Admitting: Physical Therapy

## 2015-01-25 DIAGNOSIS — M6281 Muscle weakness (generalized): Secondary | ICD-10-CM | POA: Diagnosis not present

## 2015-01-25 DIAGNOSIS — M25661 Stiffness of right knee, not elsewhere classified: Secondary | ICD-10-CM

## 2015-01-25 DIAGNOSIS — R269 Unspecified abnormalities of gait and mobility: Secondary | ICD-10-CM | POA: Diagnosis not present

## 2015-01-25 DIAGNOSIS — M25561 Pain in right knee: Secondary | ICD-10-CM

## 2015-01-25 DIAGNOSIS — M7989 Other specified soft tissue disorders: Secondary | ICD-10-CM

## 2015-01-25 NOTE — Therapy (Signed)
[place], [place], [address] Phone: [phone]   Fax:  [phone]  Physical Therapy Treatment  Patient Details  Name: Jim Hunter MRN: [id number] Date of Birth: Feb 28, 1966 Referring Provider:  Leone Brand, MD  Encounter Date: 01/25/2015      PT End of Session - 01/25/15 0848    Visit Number 29   Number of Visits 30   Date for PT Re-Evaluation 01/29/15   PT Start Time 0845   PT Stop Time 0935   PT Time Calculation (min) 50 min   Activity Tolerance Patient tolerated treatment well      Past Medical History  Diagnosis Date  . Hypertension   . Hyperlipidemia   . Arthritis   . Sleep apnea 3 years ago    no borderline no cpap needed per sleep studty done  . GERD (gastroesophageal reflux disease)   . PPD screening test last 9 years ago first started as child    treated as child for positive ppd test    Past Surgical History  Procedure Laterality Date  . Rotator cuff repair  2010    left  . Lumbar disc surgery  2006  . Tympanoplasty      age 17  . Total knee arthroplasty Right 09/29/2014    Procedure: RIGHT TOTAL KNEE ARTHROPLASTY;  Surgeon: Paralee Cancel, MD;  Location: [facility];  Service: Orthopedics;  Laterality: Right;    There were no vitals filed for this visit.  Visit Diagnosis:  Stiffness of knee joint, right  Pain in knee joint, right  Muscle weakness (generalized)  Abnormality of gait  Swelling of limb      Subjective Assessment - 01/25/15 0847    Subjective Pt reports Rt knee is stiff; still is painful when bending. Pt reports his Rt knee buckled after performing resisted knee extension last session.    Currently in Pain? No/denies            [facility] PT Assessment - 01/25/15 0001    Assessment   Medical Diagnosis Rt TKR   Onset Date/Surgical Date 09/29/14   Next MD Visit 01/29/15   AROM   AROM Assessment Site Knee   Right/Left Knee Right   Right Knee  Extension -2   Right Knee Flexion 124  supine; after stretch   Left Knee Flexion 145  supine              [facility] Adult PT Treatment/Exercise - 01/25/15 0001    Knee/Hip Exercises: Aerobic   Stationary Bike L4 x 2 min, L6 x 4 min    Elliptical L3: 50mn forward / 3 min backward    Knee/Hip Exercises: Machines for Strengthening   Cybex Knee Extension 3 plates with RLE only x 10 reps x 2 sets (c/o burning at Rt lateral joint line   Knee/Hip Exercises: Seated   Other Seated Knee/Hip Exercises Lt/Rt kneeling working on increased Rt knee ROM.    Knee/Hip Exercises: Prone   Hamstring Curl 10 reps;3 sets   Hamstring Curl Limitations 5# with prone hang x 2 min between   Manual Therapy   Manual Therapy Other (comment)   Other Manual Therapy ice massage to Rt lateral knee x 5 min                   PT Short Term Goals - 11/25/14 1049    PT SHORT TERM GOAL #1   Title I with Knee ROM exercise  Status Achieved   PT SHORT TERM GOAL #2   Title increase Rt knee extension to -8 degrees   Status Achieved   PT SHORT TERM GOAL #3   Title increase Rt knee flexion 115 degrees   Status Achieved   PT SHORT TERM GOAL #4   Title ambulate without assistive device on even surfaces   Status Achieved   PT SHORT TERM GOAL #5   Title reduce pain =/> 50% with dialy activity   Status Achieved           PT Long Term Goals - 01/22/15 7078    PT LONG TERM GOAL #1   Title I with advanced HEP ( 01/01/15)   Status Achieved   PT LONG TERM GOAL #2   Title ambulate on even/uneven surfaces and stairs with normalized gait ( 01/01/15)   Status Achieved   PT LONG TERM GOAL #3   Title increased strength Rt hip =/> 5/5   Status Achieved   PT LONG TERM GOAL #4   Title demo Rt knee ROM WFL  ( 01/01/15)   Status Partially Met   PT LONG TERM GOAL #5   Title improve FOTO =/< 47% limited ( 01/01/15)   Status Not Met   PT LONG TERM GOAL #6   Title lift 50# floor to waist level without trouble    Status Achieved   PT LONG TERM GOAL #7   Title I and painfree with lifting and getting his Rt LE into his truck (01/01/15)   Status Partially Met   PT LONG TERM GOAL #8   Title I without difficulty stepping Rt LE in/out of his tub ([address])   Status Achieved               Plan - 01/25/15 0933    Clinical Impression Statement Pt gained another degree of Rt knee flex and ext.  Pt c/o some burning and clicking in Rt lateral knee during resisted knee extension; reduced with ice massage afterward.  Progressing towards remaining goal.    Pt will benefit from skilled therapeutic intervention in order to improve on the following deficits Abnormal gait;Decreased range of motion;Difficulty walking;Impaired flexibility;Decreased skin integrity;Pain;Decreased mobility;Decreased strength;Decreased balance   Rehab Potential Excellent   PT Frequency 2x / week   PT Duration 4 weeks   PT Treatment/Interventions Moist Heat;Therapeutic activities;Patient/family education;Scar mobilization;Passive range of motion;Therapeutic exercise;DME Instruction;Ultrasound;Gait training;Balance training;Manual techniques;Neuromuscular re-education;Stair training;Cryotherapy;Electrical Stimulation   PT Next Visit Plan Give HEP for eccentric quad control with final advanced HEP. Write MD note.    Consulted and Agree with Plan of Care Patient        Problem List Patient Active Problem List   Diagnosis Date Noted  . Obese 09/30/2014  . S/P right TKA 09/29/2014  . S/P knee replacement 09/29/2014  . Excessive cerumen in ear canal 09/02/2014  . Alcohol use 07/06/2014  . Osteoarthritis of right knee 05/26/2014  . Erectile dysfunction 05/26/2014  . Numbness and tingling in left arm 07/02/2013  . Sensation of fullness in left ear 05/02/2013  . Back pain 05/02/2013  . GERD (gastroesophageal reflux disease) 10/02/2012  . Umbilical hernia 92/07/69  . Hypertension 07/29/2012  . Hyperlipidemia 07/29/2012  . Obesity  (BMI 30-39.9) 07/29/2012    Kerin Perna, PTA 01/25/2015 9:39 AM  [facility] [place], [place], [address] Phone: [phone]   Fax:  [phone]

## 2015-01-27 ENCOUNTER — Ambulatory Visit (INDEPENDENT_AMBULATORY_CARE_PROVIDER_SITE_OTHER): Payer: PRIVATE HEALTH INSURANCE | Admitting: Physical Therapy

## 2015-01-27 DIAGNOSIS — M25561 Pain in right knee: Secondary | ICD-10-CM

## 2015-01-27 DIAGNOSIS — M6281 Muscle weakness (generalized): Secondary | ICD-10-CM | POA: Diagnosis not present

## 2015-01-27 DIAGNOSIS — M25661 Stiffness of right knee, not elsewhere classified: Secondary | ICD-10-CM | POA: Diagnosis not present

## 2015-01-27 NOTE — Therapy (Addendum)
[place], [place], [address] Phone: [phone]   Fax:  [phone]  Physical Therapy Treatment  Patient Details  Name: Jim Hunter MRN: [id number] Date of Birth: [birth year] Referring Provider:  Paralee Cancel, MD  Encounter Date: 01/27/2015      PT End of Session - 01/27/15 0849    Visit Number 30   Number of Visits 30   Date for PT Re-Evaluation 01/29/15   PT Start Time 7989   PT Stop Time 0942   PT Time Calculation (min) 53 min      Past Medical History  Diagnosis Date  . Hypertension   . Hyperlipidemia   . Arthritis   . Sleep apnea 3 years ago    no borderline no cpap needed per sleep studty done  . GERD (gastroesophageal reflux disease)   . PPD screening test last 9 years ago first started as child    treated as child for positive ppd test    Past Surgical History  Procedure Laterality Date  . Rotator cuff repair  2010    left  . Lumbar disc surgery  2006  . Tympanoplasty      age 49  . Total knee arthroplasty Right 09/29/2014    Procedure: RIGHT TOTAL KNEE ARTHROPLASTY;  Surgeon: Paralee Cancel, MD;  Location: [facility];  Service: Orthopedics;  Laterality: Right;    There were no vitals filed for this visit.  Visit Diagnosis:  Stiffness of knee joint, right  Pain in knee joint, right  Muscle weakness (generalized)      Subjective Assessment - 01/27/15 0850    Subjective Nothing new to report. Pt reported that he didn't notice a difference with Rt knee regarding not using ice/estim.  Pt reports his Rt knee remains stiff in morning, difficulty with stairs (weakness with eccentric),  and his incision remains sensitive.    Currently in Pain? No/denies            [facility] PT Assessment - 01/27/15 0001    Assessment   Medical Diagnosis Rt TKR   Onset Date/Surgical Date 09/29/14   Next MD Visit 01/29/15   Observation/Other Assessments   Focus on Therapeutic Outcomes (FOTO)   36% limited (goal 46%)    AROM   AROM Assessment Site Knee   Right/Left Knee Right;Left   Right Knee Extension -1   Right Knee Flexion 124   Left Knee Extension 0   Left Knee Flexion 145  supine             [facility] Adult PT Treatment/Exercise - 01/27/15 0001    Knee/Hip Exercises: Stretches   Gastroc Stretch 2 reps;30 seconds   Knee/Hip Exercises: Aerobic   Elliptical L3.5: 2 min forward /backward x 2 (8 min total)     Knee/Hip Exercises: Standing   Heel Raises Right;2 sets;10 reps   Step Down Right;2 sets;10 reps;Hand Hold: 1;Step Height: 8"   Lunge Walking - Round Trips 50 ft x 2 with 5# wts in each hand.    SLS with Vectors x 15 throws of Red weight ball at rebounder blue pad angled R/L.    Knee/Hip Exercises: Prone   Hamstring Curl 2 sets;10 reps   Prone Knee Hang 4 minutes  2 sets    Prone Knee Hang Weights (lbs) 5   Modalities   Modalities Cryotherapy   Cryotherapy   Number Minutes Cryotherapy 12 Minutes   Cryotherapy Location Knee   Type of Cryotherapy Ice pack  PT Short Term Goals - 11/25/14 1049    PT SHORT TERM GOAL #1   Title I with Knee ROM exercise   Status Achieved   PT SHORT TERM GOAL #2   Title increase Rt knee extension to -8 degrees   Status Achieved   PT SHORT TERM GOAL #3   Title increase Rt knee flexion 115 degrees   Status Achieved   PT SHORT TERM GOAL #4   Title ambulate without assistive device on even surfaces   Status Achieved   PT SHORT TERM GOAL #5   Title reduce pain =/> 50% with dialy activity   Status Achieved           PT Long Term Goals - 01/27/15 1254    PT LONG TERM GOAL #1   Title I with advanced HEP ( 01/01/15)   Time 3   Period Weeks   Status Achieved   PT LONG TERM GOAL #2   Title ambulate on even/uneven surfaces and stairs with normalized gait ( 01/01/15)   Time 4   Period Weeks   Status Achieved   PT LONG TERM GOAL #3   Title increased strength Rt hip =/> 5/5   Time 4   Period Weeks   Status  Achieved   PT LONG TERM GOAL #4   Title demo Rt knee ROM WFL  ( 01/01/15)   Time 4   Period Weeks   Status Achieved -1 degree extension to 124 degrees flexion   PT LONG TERM GOAL #5   Title improve FOTO =/< 47% limited ( 01/01/15)   Time 4   Period Weeks   Status Achieved   PT LONG TERM GOAL #6   Title lift 50# floor to waist level without trouble   Time 4   Period Weeks   Status Achieved   PT LONG TERM GOAL #7   Title I and painfree with lifting and getting his Rt LE into his truck (01/01/15)   Time 3   Period Weeks   Status Achieved   PT LONG TERM GOAL #8   Title I without difficulty stepping Rt LE in/out of his tub ([address]383)   Time 3   Period Weeks   Status Achieved               Plan - 01/27/15 1255    Clinical Impression Statement Pt has met all but his Rt knee ROM goal (1-124 deg Rt knee flexion); pt reports he is satisfied with current level of function and is looking forward to returning to work.  Pt interested in d/c to finalized HEP.    Pt will benefit from skilled therapeutic intervention in order to improve on the following deficits Abnormal gait;Decreased range of motion;Difficulty walking;Impaired flexibility;Decreased skin integrity;Pain;Decreased mobility;Decreased strength;Decreased balance   Rehab Potential Excellent   PT Next Visit Plan Spoke to supervising PT; will d/c to HEP.     Consulted and Agree with Plan of Care Patient        Problem List Patient Active Problem List   Diagnosis Date Noted  . Obese 09/30/2014  . S/P right TKA 09/29/2014  . S/P knee replacement 09/29/2014  . Excessive cerumen in ear canal 09/02/2014  . Alcohol use 07/06/2014  . Osteoarthritis of right knee 05/26/2014  . Erectile dysfunction 05/26/2014  . Numbness and tingling in left arm 07/02/2013  . Sensation of fullness in left ear 05/02/2013  . Back pain 05/02/2013  . GERD (gastroesophageal reflux disease) 10/02/2012  .  Umbilical hernia 47/99/8721  . Hypertension  07/29/2012  . Hyperlipidemia 07/29/2012  . Obesity (BMI 30-39.9) 07/29/2012    Kerin Perna, PTA 01/27/2015 1:00 PM  [place], [place], [address] Phone: [phone]   Fax:  [phone]  PHYSICAL THERAPY DISCHARGE SUMMARY  Visits from Start of Care: 30  Current functional level related to goals / functional outcomes: See above   Remaining deficits: none   Education / Equipment: HEP Plan: Patient agrees to discharge.  Patient goals were met. Patient is being discharged due to meeting the stated rehab goals.  ?????   Jeral Pinch, PT 01/27/2015 3:58 PM

## 2015-01-27 NOTE — Patient Instructions (Signed)
Home exercises   Heel Raise: Unilateral (Standing)   Balance on left foot, then rise on ball of foot. Repeat _10___ times per set. Do __3__ sets per session. Do _1___ sessions per day.  Anterior Step-Down   Stand with both feet on _8__ inch step. Step down in A direction with left foot, touching heel to the floor and return _10__ times. _2__ sets __3_ times per week.  Knee Extension Mobilization: Hang (Prone)   With table supporting thighs, place _5___ pound weight on right ankle. Hold _3-5__ minutes. Perform 10 slow hamstring curls between sets.  Repeat __2__ times per set. Do _1___ sets per session. Do __1-2__ sessions per day.  Body-Weight Forward Lunge: Stable - Stationary (Active)   Stand in wide stride, legs shoulder width apart, head up, back flat. Bend both legs simultaneously until forward thigh is parallel to floor. Complete _2-3__ sets of _10__ repetitions. Perform _3__ sessions per week.  [facility] at [facility] [place], [place] [address]  [phone] (office) [phone] (fax)

## 2015-03-03 ENCOUNTER — Other Ambulatory Visit: Payer: Self-pay | Admitting: Family Medicine

## 2015-03-03 DIAGNOSIS — N529 Male erectile dysfunction, unspecified: Secondary | ICD-10-CM

## 2015-03-03 NOTE — Telephone Encounter (Signed)
Refill request from pharmacy. Will forward to PCP for review. Analysa Nutting, CMA. 

## 2015-03-05 MED ORDER — VIAGRA 100 MG PO TABS: ORAL_TABLET | ORAL | Status: DC

## 2015-06-28 ENCOUNTER — Other Ambulatory Visit: Payer: Self-pay | Admitting: Family Medicine

## 2015-07-30 MED FILL — VIAGRA 100 MG TABLET: 100 | 30 days supply | Qty: 6 | Fill #4

## 2015-07-30 MED FILL — FLUTICASONE PROP 50 MCG SPR: 50 | 30 days supply | Qty: 16 | Fill #2

## 2015-08-25 MED FILL — VIAGRA 100 MG TABLET: 100 | 30 days supply | Qty: 6 | Fill #5

## 2015-09-27 ENCOUNTER — Other Ambulatory Visit: Payer: Self-pay | Admitting: Family Medicine

## 2015-09-27 MED FILL — LISINOPRIL 20 MG TABLET: 20 | 90 days supply | Qty: 90 | Fill #1

## 2015-09-27 MED FILL — VIAGRA 100 MG TABLET: 100 | 20 days supply | Qty: 4 | Fill #6

## 2015-09-27 MED FILL — OMEPRAZOLE DR 20 MG CAPSULE: 20 | 90 days supply | Qty: 90 | Fill #1

## 2015-10-20 MED FILL — VIAGRA 100 MG TABLET: 100 | 30 days supply | Qty: 6 | Fill #0

## 2015-11-01 ENCOUNTER — Ambulatory Visit (INDEPENDENT_AMBULATORY_CARE_PROVIDER_SITE_OTHER): Payer: 59 | Admitting: Family Medicine

## 2015-11-01 ENCOUNTER — Encounter: Payer: Self-pay | Admitting: Family Medicine

## 2015-11-01 VITALS — BP 128/90 | HR 92 | Temp 97.9°F | Ht 69.0 in | Wt 238.6 lb

## 2015-11-01 DIAGNOSIS — Z789 Other specified health status: Secondary | ICD-10-CM

## 2015-11-01 DIAGNOSIS — Z0000 Encounter for general adult medical examination without abnormal findings: Secondary | ICD-10-CM | POA: Diagnosis not present

## 2015-11-01 DIAGNOSIS — G4730 Sleep apnea, unspecified: Secondary | ICD-10-CM

## 2015-11-01 DIAGNOSIS — Z1211 Encounter for screening for malignant neoplasm of colon: Secondary | ICD-10-CM | POA: Diagnosis not present

## 2015-11-01 DIAGNOSIS — I10 Essential (primary) hypertension: Secondary | ICD-10-CM

## 2015-11-01 DIAGNOSIS — Z7289 Other problems related to lifestyle: Secondary | ICD-10-CM

## 2015-11-01 DIAGNOSIS — Z125 Encounter for screening for malignant neoplasm of prostate: Secondary | ICD-10-CM | POA: Diagnosis not present

## 2015-11-01 DIAGNOSIS — R0683 Snoring: Secondary | ICD-10-CM

## 2015-11-01 DIAGNOSIS — G4733 Obstructive sleep apnea (adult) (pediatric): Secondary | ICD-10-CM

## 2015-11-01 MED ORDER — LISINOPRIL 20 MG PO TABS: 20.0000 mg | ORAL_TABLET | Freq: Every day | ORAL | Status: DC

## 2015-11-01 NOTE — Progress Notes (Signed)
   Subjective:    Patient ID: Jim Hunter, male    DOB: [birth year], 50 y.o.   MRN: [id number]  HPI  CC: physical  # Healthcare maintenance:  Turned 17, due for colonoscopy.   Does not get regular exercise  Former smoker.  Alcohol use: drinks only on the weekends. "like to drink but don't like to get drunk" -- 6 pack on the weekend, Saturdays usually.  # Hypertension  Taking medicines as prescribed, lisinopril ROS: intermittent headaches (dull, "nuisance"), CP, SOB  # Snoring  Had sleep study 5-6 years ago and told he had sleep apnea  Says recently wife has been complaining of more snoring  Sometimes hits a "wall" and needs to sleep during day. If his work day is busy he will be able to stay up  Complains of some daytime sleepiness.   Social Hx: former smoker, 10 pack year history  Review of Systems   See HPI for ROS.   Past medical history, surgical, family, and social history reviewed and updated in the EMR as appropriate. Objective:  BP 128/90 mmHg  Pulse 92  Temp(Src) 97.9 F (36.6 C) (Oral)  Ht 5\' 9"  (1.753 m)  Wt 238 lb 9.6 oz (108.228 kg)  BMI 35.22 kg/m2 Vitals and nursing note reviewed  General: no apparent distress  Eyes: PERRL, EOMI ENTM: TMs pearly gray bilaterally. No oropharyngeal lesions.  Neck: supple. CV: normal rate, regular rhythm, no murmurs, rubs or gallop.  Resp: clear to auscultation bilaterally, normal effort Abdomen: soft, nontender, nondistended, normal bowel sounds  Extremities: no LE edema. Normal muscle bulk/tone Neuro: alert and oriented, no focal deficits Psych: normal mood & affect, normal thought content & speech   Assessment & Plan:  Hypertension At goal. Continue lisinopril 20mg . BMP checked and was normal. Follow up 6-12 months.  Healthcare maintenance Referral to GI for colonoscopy. Discussed with patient and went ahead with PSA, which was normal.   OSA (obstructive sleep apnea) Has prior diagnosis from sleep  study 5-6 years ago. He is interested in repeating this study and starting CPAP if indicated. Referral sent.  Alcohol use Discussed today, he admits to primarily weekend drinking, ~6 packs. He does not feel this is interfering with his life at all. CAGE negative.

## 2015-11-01 NOTE — Patient Instructions (Addendum)
Colonoscopy: you will be getting a phone call for an appointment.   Sleep study: you will be getting a phone call for an appointment.

## 2015-11-02 LAB — PSA: PSA: 0.65 ng/mL (ref <=4.00)

## 2015-11-02 LAB — BASIC METABOLIC PANEL WITH GFR
BUN: 19 mg/dL (ref 7–25)
CO2: 24 mmol/L (ref 20–31)
Calcium: 9 mg/dL (ref 8.6–10.3)
Chloride: 105 mmol/L (ref 98–110)
Creat: 1.06 mg/dL (ref 0.70–1.33)
GFR, EST NON AFRICAN AMERICAN: 81 mL/min (ref >=60)
Glucose, Bld: 90 mg/dL (ref 65–99)
POTASSIUM: 3.8 mmol/L (ref 3.5–5.3)
Sodium: 139 mmol/L (ref 135–146)

## 2015-11-03 DIAGNOSIS — G4733 Obstructive sleep apnea (adult) (pediatric): Secondary | ICD-10-CM | POA: Insufficient documentation

## 2015-11-03 DIAGNOSIS — Z0000 Encounter for general adult medical examination without abnormal findings: Secondary | ICD-10-CM | POA: Insufficient documentation

## 2015-11-03 NOTE — Assessment & Plan Note (Addendum)
Referral to GI for colonoscopy. Discussed with patient and went ahead with PSA, which was normal.

## 2015-11-03 NOTE — Assessment & Plan Note (Addendum)
At goal. Continue lisinopril 20mg . BMP checked and was normal. Follow up 6-12 months.

## 2015-11-03 NOTE — Assessment & Plan Note (Signed)
Has prior diagnosis from sleep study 5-6 years ago. He is interested in repeating this study and starting CPAP if indicated. Referral sent.

## 2015-11-03 NOTE — Assessment & Plan Note (Signed)
Discussed today, he admits to primarily weekend drinking, ~6 packs. He does not feel this is interfering with his life at all. CAGE negative.

## 2015-11-11 ENCOUNTER — Encounter: Payer: Self-pay | Admitting: Family Medicine

## 2015-11-11 ENCOUNTER — Telehealth: Payer: Self-pay | Admitting: Family Medicine

## 2015-11-11 NOTE — Telephone Encounter (Signed)
Called patient and left VM stating I would send a letter in the mail for results. (results were normal). -Dr. Lamar Benes

## 2015-11-19 MED FILL — VIAGRA 100 MG TABLET: 100 | 30 days supply | Qty: 6 | Fill #1

## 2015-12-21 MED FILL — OMEPRAZOLE DR 20 MG CAPSULE: 20 | 90 days supply | Qty: 90 | Fill #2

## 2015-12-21 MED FILL — VIAGRA 100 MG TABLET: 100 | 30 days supply | Qty: 6 | Fill #2

## 2015-12-21 MED FILL — LISINOPRIL 20 MG TABLET: 20 | 90 days supply | Qty: 90 | Fill #2

## 2015-12-31 ENCOUNTER — Encounter (HOSPITAL_BASED_OUTPATIENT_CLINIC_OR_DEPARTMENT_OTHER): Payer: Self-pay

## 2016-01-14 ENCOUNTER — Encounter (HOSPITAL_BASED_OUTPATIENT_CLINIC_OR_DEPARTMENT_OTHER): Payer: Self-pay

## 2016-01-19 MED FILL — VIAGRA 100 MG TABLET: 100 | 30 days supply | Qty: 6 | Fill #3

## 2016-02-10 ENCOUNTER — Encounter (HOSPITAL_BASED_OUTPATIENT_CLINIC_OR_DEPARTMENT_OTHER): Payer: Self-pay

## 2016-02-12 ENCOUNTER — Ambulatory Visit (HOSPITAL_BASED_OUTPATIENT_CLINIC_OR_DEPARTMENT_OTHER): Payer: 59 | Attending: Family Medicine | Admitting: Internal Medicine

## 2016-02-12 VITALS — Ht 69.0 in | Wt 230.0 lb

## 2016-02-12 DIAGNOSIS — G4730 Sleep apnea, unspecified: Secondary | ICD-10-CM | POA: Insufficient documentation

## 2016-02-12 DIAGNOSIS — R0683 Snoring: Secondary | ICD-10-CM | POA: Insufficient documentation

## 2016-02-15 ENCOUNTER — Other Ambulatory Visit: Payer: Self-pay | Admitting: Family Medicine

## 2016-02-15 MED FILL — VIAGRA 100 MG TABLET: 100 | 30 days supply | Qty: 6 | Fill #4

## 2016-02-16 NOTE — Telephone Encounter (Signed)
Mychart message sent to patient regarding his need for an appt. Jazmin Hartsell,CMA

## 2016-02-16 NOTE — Telephone Encounter (Signed)
Yes please patient has not been seen in over 17 months. However this does not mean he will get a refill of his pain medication and I will have to evaluate that based on exam. Thanks!

## 2016-02-20 DIAGNOSIS — R0683 Snoring: Secondary | ICD-10-CM | POA: Diagnosis not present

## 2016-02-20 NOTE — Procedures (Signed)
  Patient Name: Jim Hunter, Jim Hunter Date: 02/12/2016 Gender: Male D.O.B: Nov 24, 1965 Age (years): 30 Referring Provider: Talbert Cage Height (inches): 36 Interpreting Physician: Baird Lyons MD, ABSM Weight (lbs): 230 RPSGT: Earney Hamburg BMI: 34 MRN: [id number] Neck Size: 18.00 CLINICAL INFORMATION Sleep Study Type: NPSG Indication for sleep study: Snoring Epworth Sleepiness Score: 2  SLEEP STUDY TECHNIQUE As per the AASM Manual for the Scoring of Sleep and Associated Events v2.3 (April 2016) with a hypopnea requiring 4% desaturations. The channels recorded and monitored were frontal, central and occipital EEG, electrooculogram (EOG), submentalis EMG (chin), nasal and oral airflow, thoracic and abdominal wall motion, anterior tibialis EMG, snore microphone, electrocardiogram, and pulse oximetry.  MEDICATIONS Patient's medications include: charted for review. Medications self-administered by patient during sleep study : No sleep medicine administered.  SLEEP ARCHITECTURE The study was initiated at 9:29:43 PM and ended at 4:30:00 AM. Sleep onset time was 10.6 minutes and the sleep efficiency was 85.5%. The total sleep time was 359.5 minutes. Stage REM latency was 179.0 minutes. The patient spent 3.62% of the night in stage N1 sleep, 85.81% in stage N2 sleep, 0.00% in stage N3 and 10.57% in REM. Alpha intrusion was absent. Supine sleep was 20.89%. Wake after sleep onset 50 minutes  RESPIRATORY PARAMETERS The overall apnea/hypopnea index (AHI) was 3.2 per hour. There were 4 total apneas, including 3 obstructive, 1 central and 0 mixed apneas. There were 15 hypopneas and 4 RERAs. The AHI during Stage REM sleep was 1.6 per hour. AHI while supine was 6.4 per hour. The mean oxygen saturation was 95.28%. The minimum SpO2 during sleep was 91.00%. Loud snoring was noted during this study.  CARDIAC DATA The 2 lead EKG demonstrated sinus rhythm. The mean heart rate was 62.32  beats per minute. Other EKG findings include: None.  LEG MOVEMENT DATA The total PLMS were 3 with a resulting PLMS index of 0.50. Associated arousal with leg movement index was 0.0 .  IMPRESSIONS - No significant obstructive sleep apnea occurred during this study (AHI = 3.2/h). - No significant central sleep apnea occurred during this study (CAI = 0.2/h). - The patient had minimal or no oxygen desaturation during the study (Min O2 = 91.00%) - The patient snored with Loud snoring volume. - No cardiac abnormalities were noted during this study. - Clinically significant periodic limb movements did not occur during sleep. No significant associated arousals.  DIAGNOSIS - Primary Snoring (786.09 [R06.83 ICD-10])  RECOMMENDATIONS - Avoid alcohol, sedatives and other CNS depressants that may worsen sleep apnea and disrupt normal sleep architecture. - Sleep hygiene should be reviewed to assess factors that may improve sleep quality. - Weight management and regular exercise should be initiated or continued if appropriate.  [Electronically signed] 02/20/2016 02:01 PM  Baird Lyons MD, [place], American Board of Sleep Medicine   NPI: [id number] [place], American Board of Sleep Medicine  ELECTRONICALLY SIGNED ON:  02/20/2016, 1:59 PM [place] PH: [phone]   FX: [phone] [place]

## 2016-03-17 ENCOUNTER — Other Ambulatory Visit: Payer: Self-pay | Admitting: Family Medicine

## 2016-03-17 MED FILL — LISINOPRIL 20 MG TABLET: 20 | 90 days supply | Qty: 90 | Fill #3

## 2016-03-17 MED FILL — VIAGRA 100 MG TABLET: 100 | 30 days supply | Qty: 6 | Fill #5

## 2016-03-17 MED FILL — FLUTICASONE PROP 50 MCG SPR: 50 | 30 days supply | Qty: 16 | Fill #0

## 2016-04-26 ENCOUNTER — Other Ambulatory Visit: Payer: Self-pay | Admitting: *Deleted

## 2016-04-26 MED ORDER — VIAGRA 100 MG PO TABS: ORAL_TABLET | ORAL | 0 refills | Status: DC

## 2016-04-26 MED FILL — VIAGRA 100 MG TABLET: 100 | 30 days supply | Qty: 6 | Fill #0

## 2016-04-26 MED FILL — OMEPRAZOLE DR 20 MG CAPSULE: 20 | 90 days supply | Qty: 90 | Fill #3

## 2016-04-26 NOTE — Telephone Encounter (Signed)
Insurance will cover only #6 tablets per month.  Derl Barrow, RN

## 2016-04-26 NOTE — Telephone Encounter (Signed)
Patient has not been seen for about 2 years. Have never even met patient. Needs appointment. Will fill for 6 tabs with no refills.

## 2016-04-27 DIAGNOSIS — M75101 Unspecified rotator cuff tear or rupture of right shoulder, not specified as traumatic: Secondary | ICD-10-CM

## 2016-04-27 HISTORY — DX: Unspecified rotator cuff tear or rupture of right shoulder, not specified as traumatic: M75.101

## 2016-04-28 ENCOUNTER — Other Ambulatory Visit (HOSPITAL_COMMUNITY): Payer: Self-pay | Admitting: Orthopedic Surgery

## 2016-04-28 DIAGNOSIS — M25511 Pain in right shoulder: Secondary | ICD-10-CM

## 2016-05-02 ENCOUNTER — Ambulatory Visit (HOSPITAL_COMMUNITY)
Admission: RE | Admit: 2016-05-02 | Discharge: 2016-05-02 | Disposition: A | Discharge status: Home / Self Care | Payer: PRIVATE HEALTH INSURANCE | Source: Ambulatory Visit | Attending: Orthopedic Surgery | Admitting: Orthopedic Surgery

## 2016-05-02 DIAGNOSIS — S43431A Superior glenoid labrum lesion of right shoulder, initial encounter: Secondary | ICD-10-CM | POA: Diagnosis present

## 2016-05-02 DIAGNOSIS — M75101 Unspecified rotator cuff tear or rupture of right shoulder, not specified as traumatic: Secondary | ICD-10-CM | POA: Insufficient documentation

## 2016-05-02 DIAGNOSIS — M25511 Pain in right shoulder: Secondary | ICD-10-CM

## 2016-05-25 MED FILL — ETODOLAC 400 MG TABLET: 400 | 60 days supply | Qty: 60 | Fill #0

## 2016-05-25 MED FILL — DICLOFENAC SODIUM 1% GEL: 1 | 12 days supply | Qty: 100 | Fill #0

## 2016-05-26 ENCOUNTER — Other Ambulatory Visit: Payer: Self-pay | Admitting: Obstetrics and Gynecology

## 2016-05-26 MED FILL — LISINOPRIL 20 MG TABLET: 20 | 90 days supply | Qty: 90 | Fill #4

## 2016-05-29 MED FILL — VIAGRA 100 MG TABLET: 100 | 30 days supply | Qty: 6 | Fill #0

## 2016-05-29 NOTE — Telephone Encounter (Signed)
Needs PCP follow-up for additional fills.

## 2016-06-30 DIAGNOSIS — Z9889 Other specified postprocedural states: Secondary | ICD-10-CM | POA: Insufficient documentation

## 2016-06-30 MED FILL — oxyCODONE HCL 5 MG TABS: 5 | 6 days supply | Qty: 75 | Fill #0

## 2016-08-04 ENCOUNTER — Ambulatory Visit (INDEPENDENT_AMBULATORY_CARE_PROVIDER_SITE_OTHER): Payer: PRIVATE HEALTH INSURANCE | Admitting: Physical Therapy

## 2016-08-04 ENCOUNTER — Encounter: Payer: Self-pay | Admitting: Physical Therapy

## 2016-08-04 DIAGNOSIS — M25611 Stiffness of right shoulder, not elsewhere classified: Secondary | ICD-10-CM

## 2016-08-04 DIAGNOSIS — M25511 Pain in right shoulder: Secondary | ICD-10-CM

## 2016-08-04 DIAGNOSIS — M6281 Muscle weakness (generalized): Secondary | ICD-10-CM | POA: Diagnosis not present

## 2016-08-04 NOTE — Therapy (Signed)
[place], [place], [address] Phone: [phone]   Fax:  [phone]  Physical Therapy Evaluation  Patient Details  Name: Jim Hunter MRN: [id number] Date of Birth: 1965-09-21 Referring Provider: Dr Olevia Bowens Tuohy  Encounter Date: 08/04/2016      PT End of Session - 08/04/16 1520    Visit Number 1   Number of Visits 18   Date for PT Re-Evaluation 10/06/16   PT Start Time 1520   PT Stop Time 1632   PT Time Calculation (min) 72 min   Activity Tolerance Patient tolerated treatment well      Past Medical History:  Diagnosis Date  . Arthritis   . GERD (gastroesophageal reflux disease)   . Hyperlipidemia   . Hypertension   . 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 Sleep apnea 3 years ago   no borderline no cpap needed per sleep studty done    Past Surgical History:  Procedure Laterality Date  . [place] SURGERY  2006  . ROTATOR CUFF REPAIR  2010   left  . 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   Procedure: RIGHT TOTAL KNEE ARTHROPLASTY;  Surgeon: Paralee Cancel, MD;  Location: [facility];  Service: Orthopedics;  Laterality: Right;  . TYMPANOPLASTY     age 51    There were no vitals filed for this visit.       Subjective Assessment - 08/04/16 1524    Subjective Pt hurt his Rt shoulder at work last summer/fall, had surgery 06/16/16.  Wearing a sling since the surgery, he is 7 wks post op today. He is not able to drive yet and no lifting.    Pertinent History multiple orthopedic surgeries.    Patient Stated Goals drive and return to work, sleep through the night ( currently tolerates ~ 3 hrs)    Currently in Pain? Yes   Pain Score 6    Pain Location Shoulder   Pain Orientation Right   Pain Descriptors / Indicators Sore;Tender   Pain Type Surgical pain   Pain Frequency Constant            [facility] PT Assessment -  08/04/16 0001      Assessment   Medical Diagnosis Rt RTC Repair   Referring Provider Dr Olevia Bowens Tuohy   Onset Date/Surgical Date 06/16/16   Hand Dominance Right   Next MD Visit 08/31/16     Precautions   Precautions --  no driving, no lifting   Precaution Comments sling at all times, no AROM Rt shoulder     Balance Screen   Has the patient fallen in the past 6 months No     [place] residence   Living Arrangements Spouse/significant other;Parent     Prior Function   Level of [place]  currently has difficulty with bathing and dressing.    Vocation Full time employment   Engineer, technical sales with [employer]     Observation/Other Assessments   Focus on Therapeutic Outcomes (FOTO)  71% limited     ROM / Strength   AROM / PROM / Strength AROM;PROM;Strength     AROM   Overall AROM Comments cervical ROM WNL with pain   AROM Assessment Site --  elbow/wrist/hand WNL     PROM   PROM Assessment Site Shoulder   Right/Left Shoulder Right  Right Shoulder Flexion 52 Degrees   Right Shoulder ABduction 82 Degrees   Right Shoulder Internal Rotation 80 Degrees   Right Shoulder External Rotation 17 Degrees     Palpation   Palpation comment very tight in Rt pecs, trigger points in deltoid                   [facility] Adult PT Treatment/Exercise - 08/04/16 0001      Exercises   Exercises --  shoulder and elbow per handout     Modalities   Modalities Electrical Stimulation;Vasopneumatic     Electrical Stimulation   Electrical Stimulation Location Rt shoulder   Electrical Stimulation Action IFC   Electrical Stimulation Parameters to tolerance   Electrical Stimulation Goals Pain;Edema;Tone     Vasopneumatic   Number Minutes Vasopneumatic  15 minutes   Vasopnuematic Location  Shoulder   Vasopneumatic Pressure Medium   Vasopneumatic Temperature  3*                PT Education -  08/04/16 1618    Education provided Yes   Education Details HEP   Person(s) Educated Patient   Methods Explanation;Demonstration;Handout   Comprehension Returned demonstration;Verbalized understanding          PT Short Term Goals - 08/04/16 1621      PT SHORT TERM GOAL #1   Title I with initial HEP ( 09/01/16)    Time 4   Period Weeks   Status New     PT SHORT TERM GOAL #2   Title demo Rt shoulder PROM WFL ( 09/01/16)    Time 4   Period Weeks   Status New     PT SHORT TERM GOAL #3   Title tolerate initial elbow strengthening ( 09/01/16)    Time 4   Period Weeks   Status New     PT SHORT TERM GOAL #4   Title improve FOTO =/< 55% limited ( 09/01/16)    Time 4   Period Weeks   Status New           PT Long Term Goals - 08/04/16 1623      PT LONG TERM GOAL #1   Title I with advanced HEP ( 10/06/16)    Time 9   Period Weeks   Status New     PT LONG TERM GOAL #2   Title demo Rt shoulder AROM WFL ( 10/06/16)    Time 9   Period Weeks   Status New     PT LONG TERM GOAL #3   Title demo Rt shoulder strength =/> 5-/5 ( 10/06/16)    Time 9   Period Weeks   Status New     PT LONG TERM GOAL #4   Title tolerate lifting with Rt UE per MD orders without increased pain ( 10/06/16)    Time 9   Period Weeks   Status New     PT LONG TERM GOAL #5   Title improve FOTO =/< 33% limited ( 10/06/16)    Time 9   Period Weeks   Status New     Additional Long Term Goals   Additional Long Term Goals Yes     PT LONG TERM GOAL #6   Title report no more than 2/10 pain at the end of the day in the Rt shoulder ( 10/06/16)    Time 9   Period Weeks   Status New  Plan - 08/04/16 1619    Clinical Impression Statement Edrian is 52 wks post op rt RTC repair and decompression.  He has been in a sling and not performing any exercise.  He hasn't been able to come sooner as he is unable to drive per MD order and transportation is limited.  He has full ROM in the cervical  spine, Rt elbow, wrist and hand.  PROM to Rt shoulder right now is limited.    Rehab Potential Excellent   PT Frequency 2x / week   PT Duration --  9 wks   PT Treatment/Interventions Moist Heat;Ultrasound;Therapeutic exercise;Dry needling;Taping;Vasopneumatic Device;Manual techniques;Neuromuscular re-education;Cryotherapy;Electrical Stimulation;Iontophoresis 4mg /ml Dexamethasone;Patient/family education;Passive range of motion   PT Next Visit Plan PROM Rt shoulder for 3 more weeks   Consulted and Agree with Plan of Care Patient      Patient will benefit from skilled therapeutic intervention in order to improve the following deficits and impairments:  Postural dysfunction, Decreased strength, Pain, Increased muscle spasms, Impaired UE functional use, Decreased range of motion  Visit Diagnosis: Acute pain of right shoulder - Plan: PT plan of care cert/re-cert  Stiffness of right shoulder, not elsewhere classified - Plan: PT plan of care cert/re-cert  Muscle weakness (generalized) - Plan: PT plan of care cert/re-cert     Problem List Patient Active Problem List   Diagnosis Date Noted  . Healthcare maintenance 11/03/2015  . OSA (obstructive sleep apnea) 11/03/2015  . Obese 09/30/2014  . S/P right TKA 09/29/2014  . S/P knee replacement 09/29/2014  . Alcohol use 07/06/2014  . Osteoarthritis of right knee 05/26/2014  . Erectile dysfunction 05/26/2014  . GERD (gastroesophageal reflux disease) 10/02/2012  . Umbilical hernia [id number]  . Hypertension 07/29/2012  . Hyperlipidemia 07/29/2012  . Obesity (BMI 30-39.9) 07/29/2012    Jeral Pinch PT  08/04/2016, 4:31 PM  [facility] [place], [place], [address] Phone: [phone]   Fax:  [phone]  Name: Jesaiah Tomme MRN: [id number] Date of Birth: 1966/03/17

## 2016-08-04 NOTE — Patient Instructions (Addendum)
Scapular Retraction (Standing)    With arms at sides, pinch shoulder blades together. Repeat ____ times per set. Do ____ sets per session. Do ____ sessions per day.  http://orth.exer.us/944   Copyright  VHI. All rights reserved.  ROM: Flexion    Keeping left arm on table, slide body away until stretch is felt. Hold ____ seconds. Repeat ____ times per set. Do ____ sets per session. Do ____ sessions per day.  http://orth.exer.us/756   Copyright  VHI. All rights reserved.  ROM: External Rotation    Keeping left forearm palm down on table, bend forward at waist until stretch is felt. Hold __5_ seconds. Repeat __5-10__ times per set. Do ___1_ sets per session. Do __3-4__ sessions per day.  AROM: Elbow Flexion / Extension    With left hand palm up, gently bend elbow as far as possible. Then straighten arm as far as possible. Repeat __10__ times per set. Do ___1_ sets per session. Do __5-6__ sessions per day.  AROM: Wrist Flexion / Extension    Actively bend right wrist forward then back as far as possible. Repeat __10_ times per set. Do __1__ sets per session. Do _5-6___ sessions per day.  Towel Roll Squeeze    With right forearm resting on surface, gently squeeze towel. Repeat _10_ times per set. Do __1__ sets per session. Do __5-6__ sessions per day. Strengthening: Shoulder Shrug (Phase 1)    Shrug shoulders up and down. Repeat __10__ times per set. Do _1___ sets per session. Do __5-6__ sessions per day.  Pendulum Circular    Bend forward 90 at waist, leaning on table for support. Rock body in a circular pattern to move arm clockwise __10__ times then counterclockwise _10___ times. Do __5-6__ sessions per day. Or when you have spasms in the shoulder.  Copyright  VHI. All rights reserved.   Use ice at least once a day.

## 2016-08-07 ENCOUNTER — Ambulatory Visit (INDEPENDENT_AMBULATORY_CARE_PROVIDER_SITE_OTHER): Payer: PRIVATE HEALTH INSURANCE | Admitting: Physical Therapy

## 2016-08-07 DIAGNOSIS — M6281 Muscle weakness (generalized): Secondary | ICD-10-CM

## 2016-08-07 DIAGNOSIS — M25611 Stiffness of right shoulder, not elsewhere classified: Secondary | ICD-10-CM

## 2016-08-07 DIAGNOSIS — M25511 Pain in right shoulder: Secondary | ICD-10-CM | POA: Diagnosis not present

## 2016-08-07 NOTE — Therapy (Signed)
[place], [place], [address] Phone: [phone]   Fax:  [phone]  Physical Therapy Treatment  Patient Details  Name: Jim Hunter MRN: [id number] Date of Birth: 28-Nov-1965 Referring Provider: Dr. Charlotte Sanes  Encounter Date: 08/07/2016      PT End of Session - 08/07/16 1143    Visit Number 2   Number of Visits 18   Date for PT Re-Evaluation 10/06/16   PT Start Time 1105   PT Stop Time 1158   PT Time Calculation (min) 53 min   Activity Tolerance Patient tolerated treatment well   Behavior During Therapy [facility] for tasks assessed/performed      Past Medical History:  Diagnosis Date  . Arthritis   . GERD (gastroesophageal reflux disease)   . Hyperlipidemia   . Hypertension   . 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 Sleep apnea 3 years ago   no borderline no cpap needed per sleep studty done    Past Surgical History:  Procedure Laterality Date  . [place] SURGERY  2006  . ROTATOR CUFF REPAIR  2010   left  . 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   Procedure: RIGHT TOTAL KNEE ARTHROPLASTY;  Surgeon: Paralee Cancel, MD;  Location: [facility];  Service: Orthopedics;  Laterality: Right;  . TYMPANOPLASTY     age 51    There were no vitals filed for this visit.      Subjective Assessment - 08/07/16 1109    Subjective Pt states his Rt shoulder feels "sore" and tender to touch.  He reports his hands have been shaking and he's not sure why.  He's been trying to maintain his restrictions.    Currently in Pain? Yes   Pain Score 4    Pain Location Shoulder   Pain Orientation Right   Pain Descriptors / Indicators Sore;Tender   Aggravating Factors  moving arm   Pain Relieving Factors ice, heat (warm shower)            [facility] PT Assessment - 08/07/16 0001      Assessment   Medical Diagnosis Rt RTC Repair   Referring  Provider Dr. Harrell Gave Tuohy   Onset Date/Surgical Date 06/16/16   Hand Dominance Right   Next MD Visit 08/31/16     Precautions   Precautions --  no driving, no lifting   Precaution Comments sling at all times, no AROM Rt shoulder     PROM   PROM Assessment Site Shoulder   Right/Left Shoulder Right   Right Shoulder Flexion 90 Degrees  supine   Right Shoulder ABduction 100 Degrees  scaption, supine                     [facility] Adult PT Treatment/Exercise - 08/07/16 0001      Exercises   Exercises Shoulder;Elbow     Elbow Exercises   Elbow Flexion AROM;Right;10 reps     Shoulder Exercises: Supine   Other Supine Exercises scap squeeze x 5 sec x 15 reps     Shoulder Exercises: Seated   Other Seated Exercises seated scap retraction x 5 sec hold x 10      Shoulder Exercises: Standing   Other Standing Exercises Pendulum - cues to relax RUE     Shoulder Exercises: Stretch   Table Stretch - Flexion --  8 reps, held 15 sec  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IFC   Electrical Stimulation Parameters to tolerance    Electrical Stimulation Goals Edema;Pain     Vasopneumatic   Number Minutes Vasopneumatic  15 minutes   Vasopnuematic Location  Shoulder   Vasopneumatic Pressure Medium   Vasopneumatic Temperature  34 deg     Manual Therapy   Manual Therapy Passive ROM;Scapular mobilization;Myofascial release;Soft tissue mobilization;Taping   Manual therapy comments pt point tender over incision scars.    Soft tissue mobilization to Lt posterior shoulder girdle.    Myofascial Release to Lt pec    Scapular Mobilization Lt scapula in all directions   Passive ROM Lt shoulder - flexion, scaption, IR, ER.    Kinesiotex --  X lift strip over Lt ant shoulder to decompress tissue.                   PT Short Term Goals - 08/07/16 1306      PT SHORT TERM GOAL #1   Title I with initial HEP  ( 09/01/16)    Time 4   Period Weeks   Status On-going     PT SHORT TERM GOAL #2   Title demo Rt shoulder PROM WFL ( 09/01/16)    Time 4   Period Weeks   Status On-going     PT SHORT TERM GOAL #3   Title tolerate initial elbow strengthening ( 09/01/16)    Time 4   Period Weeks   Status On-going     PT SHORT TERM GOAL #4   Title improve FOTO =/< 55% limited ( 09/01/16)    Time 4   Period Weeks   Status On-going           PT Long Term Goals - 08/07/16 1305      PT LONG TERM GOAL #1   Title I with advanced HEP ( 10/06/16)    Time 9   Period Weeks   Status On-going     PT LONG TERM GOAL #2   Title demo Rt shoulder AROM WFL ( 10/06/16)    Time 9   Period Weeks   Status On-going     PT LONG TERM GOAL #3   Title demo Rt shoulder strength =/> 5-/5 ( 10/06/16)    Time 9   Period Weeks   Status On-going     PT LONG TERM GOAL #4   Title tolerate lifting with Rt UE per MD orders without increased pain ( 10/06/16)    Time 9   Period Weeks   Status On-going     PT LONG TERM GOAL #5   Title improve FOTO =/< 33% limited ( 10/06/16)    Time 9   Period Weeks   Status On-going     PT LONG TERM GOAL #6   Title report no more than 2/10 pain at the end of the day in the Rt shoulder ( 10/06/16)    Time 9   Period Weeks   Status On-going               Plan - 08/07/16 1309    Clinical Impression Statement Pt somewhat guarded with PROM; demonstrated improved scaption/flexion this visit.  Pt reported decreased pain after use of estim/vaso at end of session.    Rehab Potential Excellent   PT Frequency 2x / week   PT Treatment/Interventions Moist Heat;Ultrasound;Therapeutic exercise;Dry needling;Taping;Vasopneumatic Device;Manual techniques;Neuromuscular re-education;Cryotherapy;Electrical Stimulation;Iontophoresis 4mg /ml Dexamethasone;Patient/family education;Passive range of  motion   PT Next Visit Plan PROM Rt shoulder for 3 more weeks   Consulted and Agree with Plan of  Care Patient      Patient will benefit from skilled therapeutic intervention in order to improve the following deficits and impairments:  Postural dysfunction, Decreased strength, Pain, Increased muscle spasms, Impaired UE functional use, Decreased range of motion  Visit Diagnosis: Acute pain of right shoulder  Stiffness of right shoulder, not elsewhere classified  Muscle weakness (generalized)     Problem List Patient Active Problem List   Diagnosis Date Noted  . Healthcare maintenance 11/03/2015  . OSA (obstructive sleep apnea) 11/03/2015  . Obese 09/30/2014  . S/P right TKA 09/29/2014  . S/P knee replacement 09/29/2014  . Alcohol use 07/06/2014  . Osteoarthritis of right knee 05/26/2014  . Erectile dysfunction 05/26/2014  . GERD (gastroesophageal reflux disease) 10/02/2012  . Umbilical hernia [id number]  . Hypertension 07/29/2012  . Hyperlipidemia 07/29/2012  . Obesity (BMI 30-39.9) 07/29/2012   Kerin Perna, PTA 08/07/16 1:10 PM  [facility] [place], [place], [address] Phone: [phone]   Fax:  [phone]  Name: Jim Hunter MRN: [id number] Date of Birth: 1965-10-07

## 2016-08-08 ENCOUNTER — Ambulatory Visit (INDEPENDENT_AMBULATORY_CARE_PROVIDER_SITE_OTHER): Payer: PRIVATE HEALTH INSURANCE | Admitting: Physical Therapy

## 2016-08-08 DIAGNOSIS — M25611 Stiffness of right shoulder, not elsewhere classified: Secondary | ICD-10-CM | POA: Diagnosis not present

## 2016-08-08 DIAGNOSIS — M25511 Pain in right shoulder: Secondary | ICD-10-CM

## 2016-08-08 DIAGNOSIS — M6281 Muscle weakness (generalized): Secondary | ICD-10-CM

## 2016-08-08 NOTE — Therapy (Signed)
[place], [place], [address] Phone: [phone]   Fax:  [phone]  Physical Therapy Treatment  Patient Details  Name: Jim Hunter MRN: [id number] Date of Birth: 21-Jan-1966 Referring Provider: Dr. Charlotte Sanes  Encounter Date: 08/08/2016      PT End of Session - 08/08/16 0911    Visit Number 3   Number of Visits 18   Date for PT Re-Evaluation 10/06/16   PT Start Time 0845   PT Stop Time 0936   PT Time Calculation (min) 51 min   Activity Tolerance Patient tolerated treatment well   Behavior During Therapy [facility] for tasks assessed/performed      Past Medical History:  Diagnosis Date  . Arthritis   . GERD (gastroesophageal reflux disease)   . Hyperlipidemia   . Hypertension   . 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 Sleep apnea 3 years ago   no borderline no cpap needed per sleep studty done    Past Surgical History:  Procedure Laterality Date  . [place] SURGERY  2006  . ROTATOR CUFF REPAIR  2010   left  . 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   Procedure: RIGHT TOTAL KNEE ARTHROPLASTY;  Surgeon: Paralee Cancel, MD;  Location: [facility];  Service: Orthopedics;  Laterality: Right;  . TYMPANOPLASTY     age 51    There were no vitals filed for this visit.      Subjective Assessment - 08/08/16 0911    Subjective Pt reports he was unable to sleep last night, couldn't get comfortable. Continues to sleep in sling.     Currently in Pain? Yes   Pain Score 2    Pain Location Shoulder   Pain Orientation Right   Pain Descriptors / Indicators Dull;Aching            [facility] PT Assessment - 08/08/16 0001      Assessment   Medical Diagnosis Rt RTC Repair   Onset Date/Surgical Date 06/16/16   Hand Dominance Right   Next MD Visit 08/31/16     Precautions   Precautions --  no driving, no lifting   Precaution  Comments sling at all times, no AROM Rt shoulder     PROM   Right Shoulder External Rotation 37 Degrees  supine, shoulder abdct 30 deg           [facility] Adult PT Treatment/Exercise - 08/08/16 0001      Elbow Exercises   Elbow Flexion AROM;Right;10 reps   Wrist Flexion Right;20 reps;Seated;Bar weights/barbell  forearm supported.   Bar Weights/Barbell (Wrist Flexion) 2 lbs   Wrist Extension Right;20 reps;Seated;Bar weights/barbell   Bar Weights/Barbell (Wrist Extension) 2 lbs   Other elbow exercises Hand grip squeezes with stress ball x 30      Shoulder Exercises: Supine   Other Supine Exercises scap squeeze x 5 sec x 15 reps     Vasopneumatic   Number Minutes Vasopneumatic  15 minutes   Vasopnuematic Location  Shoulder   Vasopneumatic Pressure Medium   Vasopneumatic Temperature  34 deg     Manual Therapy   Manual Therapy Passive ROM   Myofascial Release to Lt pec    Scapular Mobilization Lt scapula in all directions   Passive ROM Lt shoulder - flexion, scaption, IR, ER., ext   Kinesiotex --  tape removed. skin irritated.  PT Education - 08/08/16 1025    Education provided Yes   Education Details Pt issued stress squeeze ball - for work on Rt hand strength.    Person(s) Educated Patient   Methods Explanation   Comprehension Verbalized understanding;Returned demonstration          PT Short Term Goals - 08/07/16 1306      PT SHORT TERM GOAL #1   Title I with initial HEP ( 09/01/16)    Time 4   Period Weeks   Status On-going     PT SHORT TERM GOAL #2   Title demo Rt shoulder PROM WFL ( 09/01/16)    Time 4   Period Weeks   Status On-going     PT SHORT TERM GOAL #3   Title tolerate initial elbow strengthening ( 09/01/16)    Time 4   Period Weeks   Status On-going     PT SHORT TERM GOAL #4   Title improve FOTO =/< 55% limited ( 09/01/16)    Time 4   Period Weeks   Status On-going           PT Long Term Goals - 08/07/16 1305       PT LONG TERM GOAL #1   Title I with advanced HEP ( 10/06/16)    Time 9   Period Weeks   Status On-going     PT LONG TERM GOAL #2   Title demo Rt shoulder AROM WFL ( 10/06/16)    Time 9   Period Weeks   Status On-going     PT LONG TERM GOAL #3   Title demo Rt shoulder strength =/> 5-/5 ( 10/06/16)    Time 9   Period Weeks   Status On-going     PT LONG TERM GOAL #4   Title tolerate lifting with Rt UE per MD orders without increased pain ( 10/06/16)    Time 9   Period Weeks   Status On-going     PT LONG TERM GOAL #5   Title improve FOTO =/< 33% limited ( 10/06/16)    Time 9   Period Weeks   Status On-going     PT LONG TERM GOAL #6   Title report no more than 2/10 pain at the end of the day in the Rt shoulder ( 10/06/16)    Time 9   Period Weeks   Status On-going               Plan - 08/08/16 0930    Clinical Impression Statement Pt reported itchy feeling under [place] tape; tape removed and pt had some mild redness. Pt tolerated PROM with some reduced guarding.  PROM of ER improving.     Rehab Potential Excellent   PT Frequency 2x / week   PT Duration --  9 wks   PT Treatment/Interventions Moist Heat;Ultrasound;Therapeutic exercise;Dry needling;Taping;Vasopneumatic Device;Manual techniques;Neuromuscular re-education;Cryotherapy;Electrical Stimulation;Iontophoresis 4mg /ml Dexamethasone;Patient/family education;Passive range of motion   PT Next Visit Plan PROM Rt shoulder for 3 more weeks   Consulted and Agree with Plan of Care Patient      Patient will benefit from skilled therapeutic intervention in order to improve the following deficits and impairments:  Postural dysfunction, Decreased strength, Pain, Increased muscle spasms, Impaired UE functional use, Decreased range of motion  Visit Diagnosis: Acute pain of right shoulder  Stiffness of right shoulder, not elsewhere classified  Muscle weakness (generalized)     Problem List Patient Active Problem  List   Diagnosis  Date Noted  . Healthcare maintenance 11/03/2015  . OSA (obstructive sleep apnea) 11/03/2015  . Obese 09/30/2014  . S/P right TKA 09/29/2014  . S/P knee replacement 09/29/2014  . Alcohol use 07/06/2014  . Osteoarthritis of right knee 05/26/2014  . Erectile dysfunction 05/26/2014  . GERD (gastroesophageal reflux disease) 10/02/2012  . Umbilical hernia [id number]  . Hypertension 07/29/2012  . Hyperlipidemia 07/29/2012  . Obesity (BMI 30-39.9) 07/29/2012   Kerin Perna, PTA 08/08/16 10:27 AM  [place], [place], [address] Phone: [phone]   Fax:  [phone]  Name: Breydan Cova MRN: [id number] Date of Birth: 03/12/1966

## 2016-08-14 ENCOUNTER — Ambulatory Visit (INDEPENDENT_AMBULATORY_CARE_PROVIDER_SITE_OTHER): Payer: PRIVATE HEALTH INSURANCE | Admitting: Physical Therapy

## 2016-08-14 ENCOUNTER — Encounter: Payer: Self-pay | Admitting: Physical Therapy

## 2016-08-14 DIAGNOSIS — M25511 Pain in right shoulder: Secondary | ICD-10-CM | POA: Diagnosis not present

## 2016-08-14 DIAGNOSIS — M6281 Muscle weakness (generalized): Secondary | ICD-10-CM

## 2016-08-14 DIAGNOSIS — M25611 Stiffness of right shoulder, not elsewhere classified: Secondary | ICD-10-CM

## 2016-08-14 NOTE — Therapy (Signed)
[place], [place], [address] Phone: [phone]   Fax:  [phone]  Physical Therapy Treatment  Patient Details  Name: Jim Hunter MRN: [id number] Date of Birth: August 15, 1965 Referring Provider: Dr. Charlotte Sanes  Encounter Date: 08/14/2016      PT End of Session - 08/14/16 1432    Visit Number 4   Number of Visits 18   Date for PT Re-Evaluation 10/06/16   PT Start Time [id number]   PT Stop Time 1446   PT Time Calculation (min) 44 min   Activity Tolerance Patient tolerated treatment well   Behavior During Therapy [facility] for tasks assessed/performed      Past Medical History:  Diagnosis Date  . Arthritis   . GERD (gastroesophageal reflux disease)   . Hyperlipidemia   . Hypertension   . 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 Sleep apnea 3 years ago   no borderline no cpap needed per sleep studty done    Past Surgical History:  Procedure Laterality Date  . [place] SURGERY  2006  . ROTATOR CUFF REPAIR  2010   left  . 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   Procedure: RIGHT TOTAL KNEE ARTHROPLASTY;  Surgeon: Paralee Cancel, MD;  Location: [facility];  Service: Orthopedics;  Laterality: Right;  . TYMPANOPLASTY     age 51    There were no vitals filed for this visit.          [facility] PT Assessment - 08/14/16 0001      Assessment   Medical Diagnosis Rt RTC Repair   Onset Date/Surgical Date 06/16/16   Hand Dominance Right   Next MD Visit 08/31/16     Precautions   Precautions --  no driving, no lifting   Precaution Comments sling at all times, no AROM Rt shoulder           [facility] Adult PT Treatment/Exercise - 08/14/16 0001      Elbow Exercises   Elbow Flexion AROM;Right;Standing;20 reps   Bar Weights/Barbell (Elbow Flexion) 1 lb     Shoulder Exercises: Standing   Flexion PROM   Other Standing Exercises Pendulum  side to side and front back motion (BUE)- cues to relax RUE   Other Standing Exercises scap squeeze around pool noodle x 10 sec x 10 reps:  shoulder circles CW/CCW x 10 each direction.      Shoulder Exercises: Stretch   Table Stretch - Flexion --  8 reps, held 10 sec. VC for Rt arm to remain passive     Modalities   Modalities Passenger transport manager Location Rt shoulder   Electrical Stimulation Action IFC   Electrical Stimulation Parameters to tolerance   Electrical Stimulation Goals Edema;Pain     Vasopneumatic   Number Minutes Vasopneumatic  15 minutes   Vasopnuematic Location  Shoulder   Vasopneumatic Pressure Medium   Vasopneumatic Temperature  34 deg     Manual Therapy   Manual Therapy Passive ROM   Passive ROM Lt shoulder - flexion, scaption, IR, ER., ext                  PT Short Term Goals - 08/07/16 1306      PT SHORT TERM GOAL #1   Title I with initial HEP ( 09/01/16)    Time 4   Period  Weeks   Status On-going     PT SHORT TERM GOAL #2   Title demo Rt shoulder PROM WFL ( 09/01/16)    Time 4   Period Weeks   Status On-going     PT SHORT TERM GOAL #3   Title tolerate initial elbow strengthening ( 09/01/16)    Time 4   Period Weeks   Status On-going     PT SHORT TERM GOAL #4   Title improve FOTO =/< 55% limited ( 09/01/16)    Time 4   Period Weeks   Status On-going           PT Long Term Goals - 08/07/16 1305      PT LONG TERM GOAL #1   Title I with advanced HEP ( 10/06/16)    Time 9   Period Weeks   Status On-going     PT LONG TERM GOAL #2   Title demo Rt shoulder AROM WFL ( 10/06/16)    Time 9   Period Weeks   Status On-going     PT LONG TERM GOAL #3   Title demo Rt shoulder strength =/> 5-/5 ( 10/06/16)    Time 9   Period Weeks   Status On-going     PT LONG TERM GOAL #4   Title tolerate lifting with Rt UE per MD orders without increased pain ( 10/06/16)     Time 9   Period Weeks   Status On-going     PT LONG TERM GOAL #5   Title improve FOTO =/< 33% limited ( 10/06/16)    Time 9   Period Weeks   Status On-going     PT LONG TERM GOAL #6   Title report no more than 2/10 pain at the end of the day in the Rt shoulder ( 10/06/16)    Time 9   Period Weeks   Status On-going               Plan - 08/14/16 1703    Clinical Impression Statement Pt tolerating PROM with less guarding, having pain at available end range.  ROM improving.     Rehab Potential Excellent   PT Frequency 2x / week   PT Duration --  9 wks   PT Treatment/Interventions Moist Heat;Ultrasound;Therapeutic exercise;Dry needling;Taping;Vasopneumatic Device;Manual techniques;Neuromuscular re-education;Cryotherapy;Electrical Stimulation;Iontophoresis 4mg /ml Dexamethasone;Patient/family education;Passive range of motion   PT Next Visit Plan PROM Rt shoulder for 2 more weeks   Consulted and Agree with Plan of Care Patient      Patient will benefit from skilled therapeutic intervention in order to improve the following deficits and impairments:  Postural dysfunction, Decreased strength, Pain, Increased muscle spasms, Impaired UE functional use, Decreased range of motion  Visit Diagnosis: Acute pain of right shoulder  Stiffness of right shoulder, not elsewhere classified  Muscle weakness (generalized)     Problem List Patient Active Problem List   Diagnosis Date Noted  . Healthcare maintenance 11/03/2015  . OSA (obstructive sleep apnea) 11/03/2015  . Obese 09/30/2014  . S/P right TKA 09/29/2014  . S/P knee replacement 09/29/2014  . Alcohol use 07/06/2014  . Osteoarthritis of right knee 05/26/2014  . Erectile dysfunction 05/26/2014  . GERD (gastroesophageal reflux disease) 10/02/2012  . Umbilical hernia [id number]  . Hypertension 07/29/2012  . Hyperlipidemia 07/29/2012  . Obesity (BMI 30-39.9) 07/29/2012   Kerin Perna, PTA 08/14/16 5:07 PM  [place], [place], [address] Phone: [phone]  Fax:  [phone]  Name: Jim Hunter MRN: [id number] Date of Birth: [birth year]

## 2016-08-17 ENCOUNTER — Encounter: Payer: Self-pay | Admitting: Rehabilitative and Restorative Service Providers"

## 2016-08-17 ENCOUNTER — Ambulatory Visit (INDEPENDENT_AMBULATORY_CARE_PROVIDER_SITE_OTHER): Payer: PRIVATE HEALTH INSURANCE | Admitting: Physical Therapy

## 2016-08-17 DIAGNOSIS — M6281 Muscle weakness (generalized): Secondary | ICD-10-CM | POA: Diagnosis not present

## 2016-08-17 DIAGNOSIS — M25511 Pain in right shoulder: Secondary | ICD-10-CM | POA: Diagnosis not present

## 2016-08-17 DIAGNOSIS — M25611 Stiffness of right shoulder, not elsewhere classified: Secondary | ICD-10-CM

## 2016-08-17 NOTE — Therapy (Signed)
[place], [place], [address] Phone: [phone]   Fax:  [phone]  Physical Therapy Treatment  Patient Details  Name: Jim Hunter MRN: [id number] Date of Birth: [birth year] Referring Provider: Dr. Charlotte Sanes  Encounter Date: 08/17/2016      PT End of Session - 08/17/16 0806    Visit Number 5   Number of Visits 18   Date for PT Re-Evaluation 10/06/16   PT Start Time 0803   PT Stop Time 0901   PT Time Calculation (min) 58 min      Past Medical History:  Diagnosis Date  . Arthritis   . GERD (gastroesophageal reflux disease)   . Hyperlipidemia   . Hypertension   . 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 Sleep apnea 3 years ago   no borderline no cpap needed per sleep studty done    Past Surgical History:  Procedure Laterality Date  . [place] SURGERY  2006  . ROTATOR CUFF REPAIR  2010   left  . 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   Procedure: RIGHT TOTAL KNEE ARTHROPLASTY;  Surgeon: Paralee Cancel, MD;  Location: [facility];  Service: Orthopedics;  Laterality: Right;  . TYMPANOPLASTY     age 68    There were no vitals filed for this visit.      Subjective Assessment - 08/17/16 0806    Subjective Pt reports he had a nightmare someone was holding his shoulder down; didn't sleep well.  He accidentally bumped his Rt shoulder and it is a little more sore. He is anxious to drive and sleep without sling.    Currently in Pain? Yes   Pain Score 4    Pain Location Shoulder   Pain Orientation Right   Pain Descriptors / Indicators Aching   Aggravating Factors  moving arm   Pain Relieving Factors ice, heat, estim.             [facility] PT Assessment - 08/17/16 0001      Assessment   Medical Diagnosis Rt RTC Repair   Onset Date/Surgical Date 06/16/16   Hand Dominance Right   Next MD Visit 08/31/16     Precautions    Precautions --  no driving, no lifting   Precaution Comments sling at all times, no AROM Rt shoulder     PROM   Right Shoulder Flexion 110 Degrees  supine   Right Shoulder External Rotation 40 Degrees  supine, shoulder abdct 20 deg          [facility] Adult PT Treatment/Exercise - 08/17/16 0001      Elbow Exercises   Elbow Flexion AROM;Right;20 reps;Standing     Shoulder Exercises: Standing   Other Standing Exercises Pendulum side to side and front back motion (BUE)- cues to relax RUE   Other Standing Exercises scap squeeze around pool noodle x 10 sec x 10 reps:  shoulder circles CW/CCW x 10 each direction. .2 sets.   Rt/Lt lateral neck flexion x 20 sec x 2 reps each side.      Modalities   Modalities Futures trader Action IFC   Electrical Stimulation Parameters to tolerance    Electrical Stimulation Goals Edema;Pain     Vasopneumatic   Number Minutes Vasopneumatic  15 minutes   Vasopnuematic Location  Shoulder  Rt   Vasopneumatic Pressure Medium   Vasopneumatic Temperature  34 deg     Manual Therapy   Manual therapy comments pt point tender @ Rt 1st rib and tricep insertion.    Soft tissue mobilization to Rt tricep, Rt scalenes and upper trap (pin and stretch)    Myofascial Release to Rt scalene.    Scapular Mobilization Rt scapula in all directions    Passive ROM Rt shoulder flexion, ER, scaption, extension, horiz abduct                   PT Short Term Goals - 08/07/16 1306      PT SHORT TERM GOAL #1   Title I with initial HEP ( 09/01/16)    Time 4   Period Weeks   Status On-going     PT SHORT TERM GOAL #2   Title demo Rt shoulder PROM WFL ( 09/01/16)    Time 4   Period Weeks   Status On-going     PT SHORT TERM GOAL #3   Title tolerate initial elbow strengthening ( 09/01/16)    Time 4   Period Weeks   Status On-going     PT  SHORT TERM GOAL #4   Title improve FOTO =/< 55% limited ( 09/01/16)    Time 4   Period Weeks   Status On-going           PT Long Term Goals - 08/07/16 1305      PT LONG TERM GOAL #1   Title I with advanced HEP ( 10/06/16)    Time 9   Period Weeks   Status On-going     PT LONG TERM GOAL #2   Title demo Rt shoulder AROM WFL ( 10/06/16)    Time 9   Period Weeks   Status On-going     PT LONG TERM GOAL #3   Title demo Rt shoulder strength =/> 5-/5 ( 10/06/16)    Time 9   Period Weeks   Status On-going     PT LONG TERM GOAL #4   Title tolerate lifting with Rt UE per MD orders without increased pain ( 10/06/16)    Time 9   Period Weeks   Status On-going     PT LONG TERM GOAL #5   Title improve FOTO =/< 33% limited ( 10/06/16)    Time 9   Period Weeks   Status On-going     PT LONG TERM GOAL #6   Title report no more than 2/10 pain at the end of the day in the Rt shoulder ( 10/06/16)    Time 9   Period Weeks   Status On-going               Plan - 08/17/16 1008    Clinical Impression Statement Pt's PROM improving in Rt shoulder.  He has increased guarding and increase in pain at availalble range with PROM of Rt shoulder. He is have a little more upper trap and scalene discomfort and tightness, as well as Rt elbow tenderness, which he attributes to sling wear.     Rehab Potential Excellent   PT Frequency 2x / week   PT Duration --  9 wks   PT Treatment/Interventions Moist Heat;Ultrasound;Therapeutic exercise;Dry needling;Taping;Vasopneumatic Device;Manual techniques;Neuromuscular re-education;Cryotherapy;Electrical Stimulation;Iontophoresis 4mg /ml Dexamethasone;Patient/family education;Passive range of motion   PT Next Visit Plan PROM until 2/9 per protocol.     Consulted and Agree with Plan of Care Patient  Patient will benefit from skilled therapeutic intervention in order to improve the following deficits and impairments:  Postural dysfunction, Decreased  strength, Pain, Increased muscle spasms, Impaired UE functional use, Decreased range of motion  Visit Diagnosis: Acute pain of right shoulder  Stiffness of right shoulder, not elsewhere classified  Muscle weakness (generalized)     Problem List Patient Active Problem List   Diagnosis Date Noted  . Healthcare maintenance 11/03/2015  . OSA (obstructive sleep apnea) 11/03/2015  . Obese 09/30/2014  . S/P right TKA 09/29/2014  . S/P knee replacement 09/29/2014  . Alcohol use 07/06/2014  . Osteoarthritis of right knee 05/26/2014  . Erectile dysfunction 05/26/2014  . GERD (gastroesophageal reflux disease) 10/02/2012  . Umbilical hernia [id number]  . Hypertension 07/29/2012  . Hyperlipidemia 07/29/2012  . Obesity (BMI 30-39.9) 07/29/2012   Kerin Perna, PTA 08/17/16 10:15 AM  [place], [place], [address] Phone: [phone]   Fax:  [phone]  Name: Jim Hunter MRN: [id number] Date of Birth: 1966-05-11

## 2016-08-21 ENCOUNTER — Ambulatory Visit (INDEPENDENT_AMBULATORY_CARE_PROVIDER_SITE_OTHER): Payer: PRIVATE HEALTH INSURANCE | Admitting: Physical Therapy

## 2016-08-21 DIAGNOSIS — M25611 Stiffness of right shoulder, not elsewhere classified: Secondary | ICD-10-CM

## 2016-08-21 DIAGNOSIS — M6281 Muscle weakness (generalized): Secondary | ICD-10-CM | POA: Diagnosis not present

## 2016-08-21 DIAGNOSIS — M25511 Pain in right shoulder: Secondary | ICD-10-CM | POA: Diagnosis not present

## 2016-08-21 NOTE — Therapy (Signed)
[place], [place], [address] Phone: [phone]   Fax:  [phone]  Physical Therapy Treatment  Patient Details  Name: Jim Hunter MRN: [id number] Date of Birth: Mar 18, 1966 Referring Provider: Dr. Charlotte Sanes  Encounter Date: 08/21/2016      PT End of Session - 08/21/16 1646    Visit Number 6   Number of Visits 18   Date for PT Re-Evaluation 10/06/16   PT Start Time [id number]   PT Stop Time 1659   PT Time Calculation (min) 54 min   Activity Tolerance Patient tolerated treatment well   Behavior During Therapy [facility] for tasks assessed/performed      Past Medical History:  Diagnosis Date  . Arthritis   . GERD (gastroesophageal reflux disease)   . Hyperlipidemia   . Hypertension   . 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 Sleep apnea 3 years ago   no borderline no cpap needed per sleep studty done    Past Surgical History:  Procedure Laterality Date  . [place] SURGERY  2006  . ROTATOR CUFF REPAIR  2010   left  . 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   Procedure: RIGHT TOTAL KNEE ARTHROPLASTY;  Surgeon: Paralee Cancel, MD;  Location: [facility];  Service: Orthopedics;  Laterality: Right;  . TYMPANOPLASTY     age 53    There were no vitals filed for this visit.      Subjective Assessment - 08/21/16 1647    Subjective Pt reports he had a "grabbing" sensation in his Rt lat area that wouldn't let go, not sure what caused it.  He took prescription med to help resolve it.  He reports his elbow remains sore.     Currently in Pain? Yes   Pain Score 2    Pain Location Elbow   Pain Orientation Right   Pain Descriptors / Indicators Sore   Aggravating Factors  wearing sling    Pain Relieving Factors ??            [facility] PT Assessment - 08/21/16 0001      Assessment   Medical Diagnosis Rt RTC Repair   Onset Date/Surgical  Date 06/16/16   Hand Dominance Right   Next MD Visit 08/31/16           [facility] Adult PT Treatment/Exercise - 08/21/16 0001      Elbow Exercises   Elbow Flexion Right;10 reps;Seated;Bar weights/barbell   Bar Weights/Barbell (Elbow Flexion) 2 lbs   Other elbow exercises wrist flexion and ext stretch RUE, x 3 reps      Shoulder Exercises: Standing   Other Standing Exercises Pendulum side to side and front back motion (BUE)- cues to relax RUE   Other Standing Exercises Neck lateral flex stretch Rt/Lt.  scap squeeze x 5 sec x 10 reps      Modalities   Modalities Electrical Stimulation;Vasopneumatic;Moist Heat     Moist Heat Therapy   Number Minutes Moist Heat 15 Minutes   Moist Heat Location Cervical     Electrical Stimulation   Electrical Stimulation Location Rt shoulder   Electrical Stimulation Action IFC   Electrical Stimulation Parameters to pt tolerance   Electrical Stimulation Goals Pain     Vasopneumatic   Number Minutes Vasopneumatic  15 minutes   Vasopnuematic Location  Shoulder  RUE   Vasopneumatic Pressure Low   Vasopneumatic Temperature  34 deg      Manual Therapy   Manual therapy comments pt point tender @ Rt 1st rib and tricep insertion.    Soft tissue mobilization Edge tool assistance to Rt tricep, Rt forearm wrist ext    Scapular Mobilization Rt scapula in all directions    Passive ROM Rt shoulder flexion, ER, scaption, extension, horiz abduct                   PT Short Term Goals - 08/07/16 1306      PT SHORT TERM GOAL #1   Title I with initial HEP ( 09/01/16)    Time 4   Period Weeks   Status On-going     PT SHORT TERM GOAL #2   Title demo Rt shoulder PROM WFL ( 09/01/16)    Time 4   Period Weeks   Status On-going     PT SHORT TERM GOAL #3   Title tolerate initial elbow strengthening ( 09/01/16)    Time 4   Period Weeks   Status On-going     PT SHORT TERM GOAL #4   Title improve FOTO =/< 55% limited ( 09/01/16)    Time 4   Period  Weeks   Status On-going           PT Long Term Goals - 08/07/16 1305      PT LONG TERM GOAL #1   Title I with advanced HEP ( 10/06/16)    Time 9   Period Weeks   Status On-going     PT LONG TERM GOAL #2   Title demo Rt shoulder AROM WFL ( 10/06/16)    Time 9   Period Weeks   Status On-going     PT LONG TERM GOAL #3   Title demo Rt shoulder strength =/> 5-/5 ( 10/06/16)    Time 9   Period Weeks   Status On-going     PT LONG TERM GOAL #4   Title tolerate lifting with Rt UE per MD orders without increased pain ( 10/06/16)    Time 9   Period Weeks   Status On-going     PT LONG TERM GOAL #5   Title improve FOTO =/< 33% limited ( 10/06/16)    Time 9   Period Weeks   Status On-going     PT LONG TERM GOAL #6   Title report no more than 2/10 pain at the end of the day in the Rt shoulder ( 10/06/16)    Time 9   Period Weeks   Status On-going               Plan - 08/21/16 1653    Clinical Impression Statement Pt's PROM of Rt shoulder improving with each visit.  Focus has been on PROM per protocol.  Pt reported that manual work to tricep decreased the tenderness in elbow, and vaso/estim helped decrease discomfort of shoulder.  Progressing towards goals.    Rehab Potential Excellent   PT Frequency 2x / week   PT Duration --  9 wks   PT Treatment/Interventions Moist Heat;Ultrasound;Therapeutic exercise;Dry needling;Taping;Vasopneumatic Device;Manual techniques;Neuromuscular re-education;Cryotherapy;Electrical Stimulation;Iontophoresis 4mg /ml Dexamethasone;Patient/family education;Passive range of motion   PT Next Visit Plan PROM until 2/9 per protocol.     Consulted and Agree with Plan of Care Patient      Patient will benefit from skilled therapeutic intervention in order to improve the following deficits and impairments:  Postural dysfunction, Decreased strength, Pain, Increased muscle spasms,  Impaired UE functional use, Decreased range of motion  Visit  Diagnosis: Acute pain of right shoulder  Stiffness of right shoulder, not elsewhere classified  Muscle weakness (generalized)     Problem List Patient Active Problem List   Diagnosis Date Noted  . Healthcare maintenance 11/03/2015  . OSA (obstructive sleep apnea) 11/03/2015  . Obese 09/30/2014  . S/P right TKA 09/29/2014  . S/P knee replacement 09/29/2014  . Alcohol use 07/06/2014  . Osteoarthritis of right knee 05/26/2014  . Erectile dysfunction 05/26/2014  . GERD (gastroesophageal reflux disease) 10/02/2012  . Umbilical hernia [id number]  . Hypertension 07/29/2012  . Hyperlipidemia 07/29/2012  . Obesity (BMI 30-39.9) 07/29/2012   Kerin Perna, PTA 08/21/16 4:55 PM  [place], [place], [address] Phone: [phone]   Fax:  [phone]  Name: Jim Hunter MRN: [id number] Date of Birth: 12-Jun-1966

## 2016-08-23 ENCOUNTER — Ambulatory Visit (INDEPENDENT_AMBULATORY_CARE_PROVIDER_SITE_OTHER): Payer: PRIVATE HEALTH INSURANCE

## 2016-08-23 ENCOUNTER — Other Ambulatory Visit: Payer: Self-pay | Admitting: Obstetrics and Gynecology

## 2016-08-23 ENCOUNTER — Encounter: Payer: Self-pay | Admitting: Physical Therapy

## 2016-08-23 DIAGNOSIS — M25511 Pain in right shoulder: Secondary | ICD-10-CM

## 2016-08-23 DIAGNOSIS — M6281 Muscle weakness (generalized): Secondary | ICD-10-CM | POA: Diagnosis not present

## 2016-08-23 DIAGNOSIS — M25611 Stiffness of right shoulder, not elsewhere classified: Secondary | ICD-10-CM | POA: Diagnosis not present

## 2016-08-23 MED FILL — LISINOPRIL 20 MG TABLET: 20 | 90 days supply | Qty: 90 | Fill #0

## 2016-08-23 NOTE — Therapy (Signed)
Jim Hunter  Jim Hunter [place], [place], [address] Phone: [phone]   Fax:  [phone]  Physical Therapy Treatment  Patient Details  Name: Jim Hunter MRN: [id number] Date of Birth: 02-27-1966 Referring Provider: Dr. Charlotte Sanes  Encounter Date: 08/23/2016      PT End of Session - 08/23/16 0852    Visit Number 7   Number of Visits 18   Date for PT Re-Evaluation 10/06/16   PT Start Time 0847   PT Stop Time 0945   PT Time Calculation (min) 58 min   Activity Tolerance Patient tolerated treatment well   Behavior During Therapy [facility] for tasks assessed/performed      Past Medical History:  Diagnosis Date  . Arthritis   . GERD (gastroesophageal reflux disease)   . Hyperlipidemia   . Hypertension   . 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 Sleep apnea 3 years ago   no borderline no cpap needed per sleep studty done    Past Surgical History:  Procedure Laterality Date  . [place] SURGERY  2006  . ROTATOR CUFF REPAIR  2010   left  . 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   Procedure: RIGHT TOTAL KNEE ARTHROPLASTY;  Surgeon: Paralee Cancel, MD;  Location: [facility];  Service: Orthopedics;  Laterality: Right;  . TYMPANOPLASTY     age 11    There were no vitals filed for this visit.      Subjective Assessment - 08/23/16 0849    Subjective Pt. reporting the, "grabbing" sensation has subsided and he is pain free today.     Patient Stated Goals drive and return to work, sleep through the night ( currently tolerates ~ 3 hrs)    Currently in Pain? No/denies   Pain Score 0-No pain   Multiple Pain Sites No            [facility] Adult PT Treatment/Exercise - 08/23/16 0908      Elbow Exercises   Elbow Flexion Right;Seated;Bar weights/barbell;20 reps   Bar Weights/Barbell (Elbow Flexion) 2 lbs     Shoulder Exercises: Supine   Other Supine  Exercises scap squeeze x 5 sec x 15 reps     Shoulder Exercises: Seated   Retraction AROM;10 reps;Both  5" hold    Flexion --   Flexion Weight (lbs) --   Other Seated Exercises seated scap retraction x 5 sec hold x 10      Shoulder Exercises: Standing   Other Standing Exercises Pendulum side to side and front back motion (BUE)- cues to relax RUE     Modalities   Modalities Electrical Stimulation;Vasopneumatic;Moist Heat     Electrical Stimulation   Electrical Stimulation Location Rt shoulder   Electrical Stimulation Action IFC    Electrical Stimulation Parameters to Pt. tolerance;15'   Electrical Stimulation Goals Pain     Vasopneumatic   Number Minutes Vasopneumatic  15 minutes   Vasopnuematic Location  Shoulder   Vasopneumatic Pressure Low   Vasopneumatic Temperature  34 deg      Manual Therapy   Manual Therapy Passive ROM;Myofascial release;Scapular mobilization   Myofascial Release to Rt scalene/R UT   Scapular Mobilization Rt scapula in all directions in L sidelying    Passive ROM Rt shoulder flexion, ER, scaption            PT Short Term Goals - 08/07/16 1306  PT SHORT TERM GOAL #1   Title I with initial HEP ( 09/01/16)    Time 4   Period Weeks   Status On-going     PT SHORT TERM GOAL #2   Title demo Rt shoulder PROM WFL ( 09/01/16)    Time 4   Period Weeks   Status On-going     PT SHORT TERM GOAL #3   Title tolerate initial elbow strengthening ( 09/01/16)    Time 4   Period Weeks   Status On-going     PT SHORT TERM GOAL #4   Title improve FOTO =/< 55% limited ( 09/01/16)    Time 4   Period Weeks   Status On-going           PT Long Term Goals - 08/07/16 1305      PT LONG TERM GOAL #1   Title I with advanced HEP ( 10/06/16)    Time 9   Period Weeks   Status On-going     PT LONG TERM GOAL #2   Title demo Rt shoulder AROM WFL ( 10/06/16)    Time 9   Period Weeks   Status On-going     PT LONG TERM GOAL #3   Title demo Rt shoulder  strength =/> 5-/5 ( 10/06/16)    Time 9   Period Weeks   Status On-going     PT LONG TERM GOAL #4   Title tolerate lifting with Rt UE per MD orders without increased pain ( 10/06/16)    Time 9   Period Weeks   Status On-going     PT LONG TERM GOAL #5   Title improve FOTO =/< 33% limited ( 10/06/16)    Time 9   Period Weeks   Status On-going     PT LONG TERM GOAL #6   Title report no more than 2/10 pain at the end of the day in the Rt shoulder ( 10/06/16)    Time 9   Period Weeks   Status On-going               Plan - 08/23/16 1315    Clinical Impression Statement PROM not measured today however still seems to be improving.  Pt. able to perform all PROM without muscular guarding with gentle stretching to end range per protocol.  Pt. still TTP in R scallenes area today with therapist TPR to this area to reduce guarding.  Pt. will continue to benefit from further TPR/STM in this area in future visits.  Pt. still reporting good relief from ice/compression/E-stim combo thus treatment concluding with this today.  Pt. progressing well and will continues to benefit from further skilled therapy to improve UE ROM, strength, and functional return.   PT Treatment/Interventions Moist Heat;Ultrasound;Therapeutic exercise;Dry needling;Taping;Vasopneumatic Device;Manual techniques;Neuromuscular re-education;Cryotherapy;Electrical Stimulation;Iontophoresis 4mg /ml Dexamethasone;Patient/family education;Passive range of motion   PT Next Visit Plan PROM until 2/9 per protocol.        Patient will benefit from skilled therapeutic intervention in order to improve the following deficits and impairments:  Postural dysfunction, Decreased strength, Pain, Increased muscle spasms, Impaired UE functional use, Decreased range of motion  Visit Diagnosis: Acute pain of right shoulder  Stiffness of right shoulder, not elsewhere classified  Muscle weakness (generalized)     Problem List Patient Active  Problem List   Diagnosis Date Noted  . Healthcare maintenance 11/03/2015  . OSA (obstructive sleep apnea) 11/03/2015  . Obese 09/30/2014  . S/P right TKA 09/29/2014  .  S/P knee replacement 09/29/2014  . Alcohol use 07/06/2014  . Osteoarthritis of right knee 05/26/2014  . Erectile dysfunction 05/26/2014  . GERD (gastroesophageal reflux disease) 10/02/2012  . Umbilical hernia [id number]  . Hypertension 07/29/2012  . Hyperlipidemia 07/29/2012  . Obesity (BMI 30-39.9) 07/29/2012    Bess Harvest, PTA 08/23/16 1:23 PM  [facility] [place], [place], [address] Phone: [phone]   Fax:  [phone]  Name: Jim Hunter MRN: [id number] Date of Birth: Jun 10, 1966

## 2016-08-24 ENCOUNTER — Encounter: Payer: Self-pay | Admitting: Physical Therapy

## 2016-08-25 MED FILL — SILDENAFIL 100 MG TABLET: 100 | 30 days supply | Qty: 6 | Fill #0

## 2016-08-28 ENCOUNTER — Ambulatory Visit (INDEPENDENT_AMBULATORY_CARE_PROVIDER_SITE_OTHER): Payer: PRIVATE HEALTH INSURANCE | Admitting: Rehabilitative and Restorative Service Providers"

## 2016-08-28 ENCOUNTER — Encounter: Payer: Self-pay | Admitting: Rehabilitative and Restorative Service Providers"

## 2016-08-28 DIAGNOSIS — M25611 Stiffness of right shoulder, not elsewhere classified: Secondary | ICD-10-CM | POA: Diagnosis not present

## 2016-08-28 DIAGNOSIS — M6281 Muscle weakness (generalized): Secondary | ICD-10-CM

## 2016-08-28 DIAGNOSIS — M25511 Pain in right shoulder: Secondary | ICD-10-CM | POA: Diagnosis not present

## 2016-08-28 NOTE — Therapy (Signed)
[place], [place], [address] Phone: [phone]   Fax:  [phone]  Physical Therapy Treatment  Patient Details  Name: Jim Hunter MRN: [id number] Date of Birth: Jan 08, 1966 Referring Provider: Dr Olevia Bowens Touhy  Encounter Date: 08/28/2016      PT End of Session - 08/28/16 0852    Visit Number 8   Number of Visits 18   Date for PT Re-Evaluation 10/06/16   PT Start Time 0848   PT Stop Time 0945   PT Time Calculation (min) 57 min   Activity Tolerance Patient tolerated treatment well      Past Medical History:  Diagnosis Date  . Arthritis   . GERD (gastroesophageal reflux disease)   . Hyperlipidemia   . Hypertension   . 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 Sleep apnea 3 years ago   no borderline no cpap needed per sleep studty done    Past Surgical History:  Procedure Laterality Date  . [place] SURGERY  2006  . ROTATOR CUFF REPAIR  2010   left  . 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   Procedure: RIGHT TOTAL KNEE ARTHROPLASTY;  Surgeon: Paralee Cancel, MD;  Location: [facility];  Service: Orthopedics;  Laterality: Right;  . TYMPANOPLASTY     age 49    There were no vitals filed for this visit.      Subjective Assessment - 08/28/16 0851    Subjective Doing exercise at home. Sees MD Thursday 08/31/16. Hopes to be able to add exercises after MD visit.    Currently in Pain? No/denies            [facility] PT Assessment - 08/28/16 0001      Assessment   Medical Diagnosis Rt RTC Repair   Referring Provider Dr Olevia Bowens Touhy   Onset Date/Surgical Date 06/16/16   Hand Dominance Right   Next MD Visit 08/31/16     PROM   Right/Left Shoulder --  PROM supine    Right Shoulder Extension 67 Degrees   Right Shoulder Flexion 134 Degrees   Right Shoulder ABduction 135 Degrees   Right Shoulder Internal  Rotation 47 Degrees   Right Shoulder External Rotation 70 Degrees                     [facility] Adult PT Treatment/Exercise - 08/28/16 0001      Shoulder Exercises: Supine   Other Supine Exercises scap squeeze x 10 sec x 15 reps     Shoulder Exercises: Standing   Other Standing Exercises Pendulum side to side and front back motion (BUE)- cues to relax RUE     Shoulder Exercises: Stretch   External Rotation Stretch --  w/ cane elbow 90 deg 10 sec hold x 10 noodle standing     Electrical Stimulation   Electrical Stimulation Location Rt shoulder   Electrical Stimulation Action IFC   Electrical Stimulation Parameters to tolerance   Electrical Stimulation Goals Pain;Tone     Vasopneumatic   Number Minutes Vasopneumatic  15 minutes   Vasopnuematic Location  Shoulder   Vasopneumatic Pressure Low   Vasopneumatic Temperature  34 deg      Manual Therapy   Manual Therapy Passive ROM;Myofascial release;Scapular mobilization   Manual therapy comments pt supine    Soft tissue mobilization pecs; upper trap; leveator; clavicular area; ant deltoid/biceps  Scapular Mobilization Rt scapula in supine    Passive ROM Rt shoulder flexion, extension, IR, ER, scaption                  PT Short Term Goals - 08/28/16 1000      PT SHORT TERM GOAL #1   Title I with initial HEP ( 09/01/16)    Time 4   Period Weeks   Status On-going     PT SHORT TERM GOAL #2   Title demo Rt shoulder PROM WFL ( 09/01/16)    Time 4   Period Weeks   Status On-going     PT SHORT TERM GOAL #3   Title tolerate initial elbow strengthening ( 09/01/16)    Time 4   Period Weeks   Status Achieved     PT SHORT TERM GOAL #4   Title improve FOTO =/< 55% limited ( 09/01/16)    Time 4   Period Weeks   Status On-going     PT SHORT TERM GOAL #5   Title reduce pain =/> 50% with dialy activity   Time 2   Period Weeks   Status Achieved           PT Long Term Goals - 08/28/16 1001      PT LONG  TERM GOAL #1   Title I with advanced HEP ( 10/06/16)    Time 9   Period Weeks   Status On-going     PT LONG TERM GOAL #2   Title demo Rt shoulder AROM WFL ( 10/06/16)    Time 9   Period Weeks   Status On-going     PT LONG TERM GOAL #3   Title demo Rt shoulder strength =/> 5-/5 ( 10/06/16)    Time 9   Period Weeks   Status On-going     PT LONG TERM GOAL #4   Title tolerate lifting with Rt UE per MD orders without increased pain ( 10/06/16)    Time 9   Period Weeks   Status On-going     PT LONG TERM GOAL #5   Title improve FOTO =/< 33% limited ( 10/06/16)    Time 9   Period Weeks   Status On-going     PT LONG TERM GOAL #6   Title report no more than 2/10 pain at the end of the day in the Rt shoulder ( 10/06/16)    Time 9   Period Weeks   Status On-going               Plan - 08/28/16 [id number]    Clinical Impression Statement Improving PROM - tolerated increased stretch with some discomfort which resolved with rest before repeating stretch. Good relaxation for PROM. Returns to MD 08/31/16 - note to MD at next visit.    Rehab Potential Excellent   PT Frequency 2x / week   PT Duration Other (comment)  9 weeks    PT Treatment/Interventions Moist Heat;Ultrasound;Therapeutic exercise;Dry needling;Taping;Vasopneumatic Device;Manual techniques;Neuromuscular re-education;Cryotherapy;Electrical Stimulation;Iontophoresis 4mg /ml Dexamethasone;Patient/family education;Passive range of motion   PT Next Visit Plan PROM until return to MD - note to MD at next visit    Consulted and Agree with Plan of Care Patient      Patient will benefit from skilled therapeutic intervention in order to improve the following deficits and impairments:  Postural dysfunction, Decreased strength, Pain, Increased muscle spasms, Impaired UE functional use, Decreased range of motion  Visit Diagnosis: Acute pain of right shoulder  Stiffness of right shoulder, not elsewhere classified  Muscle weakness  (generalized)     Problem List Patient Active Problem List   Diagnosis Date Noted  . Healthcare maintenance 11/03/2015  . OSA (obstructive sleep apnea) 11/03/2015  . Obese 09/30/2014  . S/P right TKA 09/29/2014  . S/P knee replacement 09/29/2014  . Alcohol use 07/06/2014  . Osteoarthritis of right knee 05/26/2014  . Erectile dysfunction 05/26/2014  . GERD (gastroesophageal reflux disease) 10/02/2012  . Umbilical hernia [id number]  . Hypertension 07/29/2012  . Hyperlipidemia 07/29/2012  . Obesity (BMI 30-39.9) 07/29/2012    Jim Hunter Nilda Simmer PT, MPH  08/28/2016, 10:02 AM  [place], [place], [address] Phone: [phone]   Fax:  [phone]  Name: Jim Hunter MRN: [id number] Date of Birth: [birth year]

## 2016-08-30 ENCOUNTER — Ambulatory Visit (INDEPENDENT_AMBULATORY_CARE_PROVIDER_SITE_OTHER): Payer: PRIVATE HEALTH INSURANCE | Admitting: Physical Therapy

## 2016-08-30 DIAGNOSIS — M25611 Stiffness of right shoulder, not elsewhere classified: Secondary | ICD-10-CM | POA: Diagnosis not present

## 2016-08-30 DIAGNOSIS — M6281 Muscle weakness (generalized): Secondary | ICD-10-CM | POA: Diagnosis not present

## 2016-08-30 DIAGNOSIS — M25511 Pain in right shoulder: Secondary | ICD-10-CM | POA: Diagnosis not present

## 2016-08-30 NOTE — Therapy (Signed)
[place], [place], [address] Phone: [phone]   Fax:  [phone]  Physical Therapy Treatment  Patient Details  Name: Jim Hunter MRN: [id number] Date of Birth: Nov 23, 1965 Referring Provider: Dr. Ena Dawley  Encounter Date: 08/30/2016      PT End of Session - 08/30/16 0928    Visit Number 9   Number of Visits 18   Date for PT Re-Evaluation 10/06/16   PT Start Time 0847   PT Stop Time 0940   PT Time Calculation (min) 53 min   Activity Tolerance Patient tolerated treatment well   Behavior During Therapy [facility] for tasks assessed/performed      Past Medical History:  Diagnosis Date  . Arthritis   . GERD (gastroesophageal reflux disease)   . Hyperlipidemia   . Hypertension   . 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 Sleep apnea 3 years ago   no borderline no cpap needed per sleep studty done    Past Surgical History:  Procedure Laterality Date  . [place] SURGERY  2006  . ROTATOR CUFF REPAIR  2010   left  . 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   Procedure: RIGHT TOTAL KNEE ARTHROPLASTY;  Surgeon: Paralee Cancel, MD;  Location: [facility];  Service: Orthopedics;  Laterality: Right;  . TYMPANOPLASTY     age 51    There were no vitals filed for this visit.      Subjective Assessment - 08/30/16 0946    Subjective Pt reports his Rt shoulder is a little sore this morning.  He didn't sleep well last night.  He is anxious to begin driving again. He is curious about his work restrictions and how soon he can return to work.    Currently in Pain? Yes   Pain Score 1    Pain Location Shoulder   Pain Orientation Right   Pain Descriptors / Indicators Dull   Aggravating Factors  ?   Pain Relieving Factors ice             [facility] PT Assessment - 08/30/16 0001      Assessment   Medical Diagnosis Rt RTC Repair   Referring Provider Dr. Ena Dawley   Onset Date/Surgical Date 06/16/16   Hand Dominance Right   Next MD Visit 08/31/16     PROM   Right/Left Shoulder --  PROM in supine   Right Shoulder Extension 67 Degrees   Right Shoulder Flexion 134 Degrees   Right Shoulder ABduction 135 Degrees   Right Shoulder Internal Rotation 47 Degrees   Right Shoulder External Rotation 70 Degrees          [facility] Adult PT Treatment/Exercise - 08/30/16 0001      Shoulder Exercises: Supine   Other Supine Exercises scap squeeze x 10 sec x 15 reps     Shoulder Exercises: Pulleys   Flexion 3 minutes   Flexion Limitations VC for Rt arm to remain passive.      Shoulder Exercises: Stretch   Table Stretch - Flexion --  8 reps, held 10 sec. VC for Rt arm to remain passive     Modalities   Modalities Electrical Stimulation;Moist Heat;Vasopneumatic     Moist Heat Therapy   Number Minutes Moist Heat 15 Minutes   Moist Heat Location Cervical     Electrical Stimulation   Electrical Stimulation Location Rt shoulder   Electrical  Stimulation Action IFC   Electrical Stimulation Parameters to tolerance    Electrical Stimulation Goals Pain     Vasopneumatic   Number Minutes Vasopneumatic  15 minutes   Vasopnuematic Location  Shoulder   Vasopneumatic Pressure Low   Vasopneumatic Temperature  34 deg      Manual Therapy   Manual Therapy Passive ROM;Myofascial release;Scapular mobilization   Soft tissue mobilization pecs; upper trap; leveator; clavicular area; ant deltoid/biceps    Scapular Mobilization Rt scapula in supine    Passive ROM Rt shoulder flexion, extension, IR, ER, scaption           PT Short Term Goals - 08/28/16 1000      PT SHORT TERM GOAL #1   Title I with initial HEP ( 09/01/16)    Time 4   Period Weeks   Status On-going     PT SHORT TERM GOAL #2   Title demo Rt shoulder PROM WFL ( 09/01/16)    Time 4   Period Weeks   Status On-going     PT SHORT TERM GOAL #3   Title  tolerate initial elbow strengthening ( 09/01/16)    Time 4   Period Weeks   Status Achieved     PT SHORT TERM GOAL #4   Title improve FOTO =/< 55% limited ( 09/01/16)    Time 4   Period Weeks   Status On-going     PT SHORT TERM GOAL #5   Title reduce pain =/> 50% with dialy activity   Time 2   Period Weeks   Status Achieved           PT Long Term Goals - 08/28/16 1001      PT LONG TERM GOAL #1   Title I with advanced HEP ( 10/06/16)    Time 9   Period Weeks   Status On-going     PT LONG TERM GOAL #2   Title demo Rt shoulder AROM WFL ( 10/06/16)    Time 9   Period Weeks   Status On-going     PT LONG TERM GOAL #3   Title demo Rt shoulder strength =/> 5-/5 ( 10/06/16)    Time 9   Period Weeks   Status On-going     PT LONG TERM GOAL #4   Title tolerate lifting with Rt UE per MD orders without increased pain ( 10/06/16)    Time 9   Period Weeks   Status On-going     PT LONG TERM GOAL #5   Title improve FOTO =/< 33% limited ( 10/06/16)    Time 9   Period Weeks   Status On-going     PT LONG TERM GOAL #6   Title report no more than 2/10 pain at the end of the day in the Rt shoulder ( 10/06/16)    Time 9   Period Weeks   Status On-going               Plan - 08/30/16 [id number]    Clinical Impression Statement Pt now 10 wks post-Rt shoulder RTC surgery. He is demonstrating improved PROM. He is tolerating increased stretch with mild discomfort and less guarding; discomfort is resolved with rest.  Pt is progressing within MD protocol.   Returns to MD tomorrow.     Rehab Potential Excellent   PT Frequency 2x / week   PT Duration --  9 wks   PT Treatment/Interventions Moist Heat;Ultrasound;Therapeutic exercise;Dry needling;Taping;Vasopneumatic Device;Manual techniques;Neuromuscular re-education;Cryotherapy;Electrical Stimulation;Iontophoresis  4mg /ml Dexamethasone;Patient/family education;Passive range of motion   PT Next Visit Plan Await new protocol from MD. Progress  Rt shoulder ROM/strengthening within protocol.    Consulted and Agree with Plan of Care Patient      Patient will benefit from skilled therapeutic intervention in order to improve the following deficits and impairments:  Postural dysfunction, Decreased strength, Pain, Increased muscle spasms, Impaired UE functional use, Decreased range of motion  Visit Diagnosis: Acute pain of right shoulder  Stiffness of right shoulder, not elsewhere classified  Muscle weakness (generalized)     Problem List Patient Active Problem List   Diagnosis Date Noted  . Healthcare maintenance 11/03/2015  . OSA (obstructive sleep apnea) 11/03/2015  . Obese 09/30/2014  . S/P right TKA 09/29/2014  . S/P knee replacement 09/29/2014  . Alcohol use 07/06/2014  . Osteoarthritis of right knee 05/26/2014  . Erectile dysfunction 05/26/2014  . GERD (gastroesophageal reflux disease) 10/02/2012  . Umbilical hernia [id number]  . Hypertension 07/29/2012  . Hyperlipidemia 07/29/2012  . Obesity (BMI 30-39.9) 07/29/2012   Kerin Perna, PTA 08/30/16 9:48 AM  [facility] [place], [place], [address] Phone: [phone]   Fax:  [phone]  Name: Jim Hunter MRN: [id number] Date of Birth: 1966/04/13

## 2016-09-04 ENCOUNTER — Encounter: Payer: Self-pay | Admitting: Rehabilitative and Restorative Service Providers"

## 2016-09-06 ENCOUNTER — Ambulatory Visit (INDEPENDENT_AMBULATORY_CARE_PROVIDER_SITE_OTHER): Payer: PRIVATE HEALTH INSURANCE | Admitting: Physical Therapy

## 2016-09-06 DIAGNOSIS — M25611 Stiffness of right shoulder, not elsewhere classified: Secondary | ICD-10-CM | POA: Diagnosis not present

## 2016-09-06 DIAGNOSIS — M6281 Muscle weakness (generalized): Secondary | ICD-10-CM

## 2016-09-06 DIAGNOSIS — M25511 Pain in right shoulder: Secondary | ICD-10-CM | POA: Diagnosis not present

## 2016-09-06 NOTE — Therapy (Signed)
[place], [place], [address] Phone: [phone]   Fax:  [phone]  Physical Therapy Treatment  Patient Details  Name: Jim Hunter MRN: [id number] Date of Birth: 1966-02-20 Referring Provider: Dr. Ena Dawley  Encounter Date: 09/06/2016      PT End of Session - 09/06/16 0925    Visit Number 10   Number of Visits 18   Date for PT Re-Evaluation 10/06/16   PT Start Time 0850   PT Stop Time 0940   PT Time Calculation (min) 50 min   Activity Tolerance Patient tolerated treatment well   Behavior During Therapy [facility] for tasks assessed/performed      Past Medical History:  Diagnosis Date  . Arthritis   . GERD (gastroesophageal reflux disease)   . Hyperlipidemia   . Hypertension   . 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 Sleep apnea 3 years ago   no borderline no cpap needed per sleep studty done    Past Surgical History:  Procedure Laterality Date  . [place] SURGERY  2006  . ROTATOR CUFF REPAIR  2010   left  . 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   Procedure: RIGHT TOTAL KNEE ARTHROPLASTY;  Surgeon: Paralee Cancel, MD;  Location: [facility];  Service: Orthopedics;  Laterality: Right;  . TYMPANOPLASTY     age 52    There were no vitals filed for this visit.      Subjective Assessment - 09/06/16 [id number]    Subjective per MD note: able to progress stretching and strengthening as tolerated.   Patient Stated Goals drive and return to work, sleep through the night ( currently tolerates ~ 3 hrs)    Currently in Pain? No/denies            [facility] PT Assessment - 09/06/16 1009      Observation/Other Assessments   Focus on Therapeutic Outcomes (FOTO)  38 (62% limited)                     [facility] Adult PT Treatment/Exercise - 09/06/16 0853      Shoulder Exercises: Isometric Strengthening   Flexion  Limitations standing 10x5"   Extension Limitations standing 10x5"   External Rotation Limitations standing 10x5"   Internal Rotation Limitations --   ABduction Limitations standing 10x5"     Electrical Stimulation   Electrical Stimulation Location Rt shoulder   Electrical Stimulation Action IFC   Electrical Stimulation Parameters to tolerance   Electrical Stimulation Goals Pain     Vasopneumatic   Number Minutes Vasopneumatic  15 minutes   Vasopnuematic Location  Shoulder   Vasopneumatic Pressure Medium   Vasopneumatic Temperature  34 deg      Manual Therapy   Manual Therapy Passive ROM;Scapular mobilization   Soft tissue mobilization pecs; upper trap; leveator; clavicular area; ant deltoid/biceps    Passive ROM Rt shoulder flexion, extension, IR, ER, scaption                  PT Short Term Goals - 08/28/16 1000      PT SHORT TERM GOAL #1   Title I with initial HEP ( 09/01/16)    Time 4   Period Weeks   Status On-going     PT SHORT TERM GOAL #2   Title demo Rt shoulder PROM WFL ( 09/01/16)    Time 4  Period Weeks   Status On-going     PT SHORT TERM GOAL #3   Title tolerate initial elbow strengthening ( 09/01/16)    Time 4   Period Weeks   Status Achieved     PT SHORT TERM GOAL #4   Title improve FOTO =/< 55% limited ( 09/01/16)    Time 4   Period Weeks   Status On-going     PT SHORT TERM GOAL #5   Title reduce pain =/> 50% with dialy activity   Time 2   Period Weeks   Status Achieved           PT Long Term Goals - 08/28/16 1001      PT LONG TERM GOAL #1   Title I with advanced HEP ( 10/06/16)    Time 9   Period Weeks   Status On-going     PT LONG TERM GOAL #2   Title demo Rt shoulder AROM WFL ( 10/06/16)    Time 9   Period Weeks   Status On-going     PT LONG TERM GOAL #3   Title demo Rt shoulder strength =/> 5-/5 ( 10/06/16)    Time 9   Period Weeks   Status On-going     PT LONG TERM GOAL #4   Title tolerate lifting with Rt UE per  MD orders without increased pain ( 10/06/16)    Time 9   Period Weeks   Status On-going     PT LONG TERM GOAL #5   Title improve FOTO =/< 33% limited ( 10/06/16)    Time 9   Period Weeks   Status On-going     PT LONG TERM GOAL #6   Title report no more than 2/10 pain at the end of the day in the Rt shoulder ( 10/06/16)    Time 9   Period Weeks   Status On-going               Plan - 09/06/16 [id number]    Clinical Impression Statement Pt tolerated initiation of isometrics today well with min c/o discomfort which resolved with ceasing of exercises.  Will continue to benefit from PT to maximize function.     PT Treatment/Interventions Moist Heat;Ultrasound;Therapeutic exercise;Dry needling;Taping;Vasopneumatic Device;Manual techniques;Neuromuscular re-education;Cryotherapy;Electrical Stimulation;Iontophoresis 4mg /ml Dexamethasone;Patient/family education;Passive range of motion   PT Next Visit Plan Progress Rt shoulder ROM/strengthening within protocol.    Consulted and Agree with Plan of Care Patient      Patient will benefit from skilled therapeutic intervention in order to improve the following deficits and impairments:  Postural dysfunction, Decreased strength, Pain, Increased muscle spasms, Impaired UE functional use, Decreased range of motion  Visit Diagnosis: Acute pain of right shoulder  Stiffness of right shoulder, not elsewhere classified  Muscle weakness (generalized)     Problem List Patient Active Problem List   Diagnosis Date Noted  . Healthcare maintenance 11/03/2015  . OSA (obstructive sleep apnea) 11/03/2015  . Obese 09/30/2014  . S/P right TKA 09/29/2014  . S/P knee replacement 09/29/2014  . Alcohol use 07/06/2014  . Osteoarthritis of right knee 05/26/2014  . Erectile dysfunction 05/26/2014  . GERD (gastroesophageal reflux disease) 10/02/2012  . Umbilical hernia [id number]  . Hypertension 07/29/2012  . Hyperlipidemia 07/29/2012  . Obesity (BMI  30-39.9) 07/29/2012     Laureen Abrahams, PT, DPT 09/06/16 10:10 AM    [facility] [place], [place], [address] Phone: [phone]  Fax:  [phone]  Name: Jim Hunter MRN: [id number] Date of Birth: [birth year]

## 2016-09-08 ENCOUNTER — Ambulatory Visit (INDEPENDENT_AMBULATORY_CARE_PROVIDER_SITE_OTHER): Payer: 59 | Admitting: Physical Therapy

## 2016-09-08 DIAGNOSIS — M25511 Pain in right shoulder: Secondary | ICD-10-CM

## 2016-09-08 DIAGNOSIS — M6281 Muscle weakness (generalized): Secondary | ICD-10-CM

## 2016-09-08 DIAGNOSIS — M25611 Stiffness of right shoulder, not elsewhere classified: Secondary | ICD-10-CM

## 2016-09-08 NOTE — Therapy (Signed)
[place], [place], [address] Phone: [phone]   Fax:  [phone]  Physical Therapy Treatment  Patient Details  Name: Jim Hunter MRN: [id number] Date of Birth: 06/23/1966 Referring Provider: Dr. Ena Dawley  Encounter Date: 09/08/2016      PT End of Session - 09/08/16 0856    Visit Number 11   Number of Visits 18   Date for PT Re-Evaluation 10/06/16   PT Start Time 0848   PT Stop Time 0943   PT Time Calculation (min) 55 min   Activity Tolerance Patient tolerated treatment well   Behavior During Therapy [facility] for tasks assessed/performed      Past Medical History:  Diagnosis Date  . Arthritis   . GERD (gastroesophageal reflux disease)   . Hyperlipidemia   . Hypertension   . 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 Sleep apnea 3 years ago   no borderline no cpap needed per sleep studty done    Past Surgical History:  Procedure Laterality Date  . [place] SURGERY  2006  . ROTATOR CUFF REPAIR  2010   left  . 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   Procedure: RIGHT TOTAL KNEE ARTHROPLASTY;  Surgeon: Paralee Cancel, MD;  Location: [facility];  Service: Orthopedics;  Laterality: Right;  . TYMPANOPLASTY     age 47    There were no vitals filed for this visit.      Subjective Assessment - 09/08/16 0856    Subjective Pt reports no new changes since last visit.     Patient Stated Goals drive and return to work, sleep through the night ( currently tolerates ~ 4-5 hrs)    Currently in Pain? No/denies   Pain Score 0-No pain   Pain Location Shoulder            [facility] PT Assessment - 09/08/16 0001      Assessment   Medical Diagnosis Rt RTC Repair   Referring Provider Dr. Ena Dawley   Onset Date/Surgical Date 06/16/16   Next MD Visit 10/03/16                     [facility] Adult PT  Treatment/Exercise - 09/08/16 0001      Shoulder Exercises: Supine   External Rotation AAROM;Right;5 reps  cane   Flexion AAROM;Right;5 reps  cane   ABduction --   Other Supine Exercises --     Shoulder Exercises: Prone   Other Prone Exercises Prone Rt row to neutral x 10; ext to neutral x 10     Shoulder Exercises: Standing   Internal Rotation AAROM;Both;5 reps  cane   Flexion AROM;Right;10 reps  ~70 deg   ABduction AAROM;Right;10 reps  scaption ~70 deg   Extension AAROM;Both;5 reps  cane   Other Standing Exercises scap squeeze around noodle x 10 sec x 10 reps     Shoulder Exercises: Pulleys   Flexion 3 minutes   ABduction 2 minutes  scaption     Shoulder Exercises: Isometric Strengthening   Flexion Limitations standing 10x5"   Extension Limitations standing 10x5"   External Rotation Limitations standing 10x5"   ABduction Limitations standing 10x5"     Electrical Stimulation   Electrical Stimulation Location Rt shoulder   Electrical Stimulation Action IFC   Electrical Stimulation Parameters to tolerance    Electrical Stimulation Goals Pain  Vasopneumatic   Number Minutes Vasopneumatic  15 minutes   Vasopnuematic Location  Shoulder   Vasopneumatic Pressure Medium   Vasopneumatic Temperature  34 deg                 PT Education - 09/08/16 0909    Education provided Yes   Education Details HEP - isometrics and AAROM for shoulder   Person(s) Educated Patient   Methods Explanation;Handout;Tactile cues   Comprehension Verbalized understanding          PT Short Term Goals - 08/28/16 1000      PT SHORT TERM GOAL #1   Title I with initial HEP ( 09/01/16)    Time 4   Period Weeks   Status On-going     PT SHORT TERM GOAL #2   Title demo Rt shoulder PROM WFL ( 09/01/16)    Time 4   Period Weeks   Status On-going     PT SHORT TERM GOAL #3   Title tolerate initial elbow strengthening ( 09/01/16)    Time 4   Period Weeks   Status Achieved      PT SHORT TERM GOAL #4   Title improve FOTO =/< 55% limited ( 09/01/16)    Time 4   Period Weeks   Status On-going     PT SHORT TERM GOAL #5   Title reduce pain =/> 50% with dialy activity   Time 2   Period Weeks   Status Achieved           PT Long Term Goals - 08/28/16 1001      PT LONG TERM GOAL #1   Title I with advanced HEP ( 10/06/16)    Time 9   Period Weeks   Status On-going     PT LONG TERM GOAL #2   Title demo Rt shoulder AROM WFL ( 10/06/16)    Time 9   Period Weeks   Status On-going     PT LONG TERM GOAL #3   Title demo Rt shoulder strength =/> 5-/5 ( 10/06/16)    Time 9   Period Weeks   Status On-going     PT LONG TERM GOAL #4   Title tolerate lifting with Rt UE per MD orders without increased pain ( 10/06/16)    Time 9   Period Weeks   Status On-going     PT LONG TERM GOAL #5   Title improve FOTO =/< 33% limited ( 10/06/16)    Time 9   Period Weeks   Status On-going     PT LONG TERM GOAL #6   Title report no more than 2/10 pain at the end of the day in the Rt shoulder ( 10/06/16)    Time 9   Period Weeks   Status On-going               Plan - 09/08/16 0931    Clinical Impression Statement Pt tolerated all exercises well, with minimal increase in pain which reduced with rest and use of vaso/estim at end of session.  Pt will continue to benefit form PT to maximize function.    Rehab Potential Excellent   PT Frequency 2x / week   PT Duration --  9 wks   PT Treatment/Interventions Moist Heat;Ultrasound;Therapeutic exercise;Dry needling;Taping;Vasopneumatic Device;Manual techniques;Neuromuscular re-education;Cryotherapy;Electrical Stimulation;Iontophoresis 4mg /ml Dexamethasone;Patient/family education;Passive range of motion   PT Next Visit Plan Progress Rt shoulder ROM/strengthening within protocol.    Consulted and Agree with Plan of Care  Patient      Patient will benefit from skilled therapeutic intervention in order to improve the  following deficits and impairments:  Postural dysfunction, Decreased strength, Pain, Increased muscle spasms, Impaired UE functional use, Decreased range of motion  Visit Diagnosis: Acute pain of right shoulder  Stiffness of right shoulder, not elsewhere classified  Muscle weakness (generalized)     Problem List Patient Active Problem List   Diagnosis Date Noted  . Healthcare maintenance 11/03/2015  . OSA (obstructive sleep apnea) 11/03/2015  . Obese 09/30/2014  . S/P right TKA 09/29/2014  . S/P knee replacement 09/29/2014  . Alcohol use 07/06/2014  . Osteoarthritis of right knee 05/26/2014  . Erectile dysfunction 05/26/2014  . GERD (gastroesophageal reflux disease) 10/02/2012  . Umbilical hernia [id number]  . Hypertension 07/29/2012  . Hyperlipidemia 07/29/2012  . Obesity (BMI 30-39.9) 07/29/2012   Kerin Perna, PTA 09/08/16 9:32 AM  [place], [place], [address] Phone: [phone]   Fax:  [phone]  Name: Jim Hunter MRN: [id number] Date of Birth: Sep 15, 1965

## 2016-09-08 NOTE — Patient Instructions (Addendum)
Strengthening: Isometric Flexion  Using wall for resistance, press right fist into ball using light pressure. Hold _5-10___ seconds. Repeat _10___ times per set.  Do _2___ sessions per day.  Extension (Isometric)  Place left bent elbow and back of arm against wall. Press elbow against wall. Hold __5-10__ seconds. Repeat _10___ times. Do __2__ sessions per day.  Internal Rotation (Isometric)  Place palm of right fist against door frame, with elbow bent. Press fist against door frame. Hold __5-10__ seconds. Repeat _10___ times. Do __2__ sessions per day.  External Rotation (Isometric)  Place back of left fist against door frame, with elbow bent. Press fist against door frame. Hold __5-10__ seconds. Repeat _10___ times. Do __2__ sessions per day.  Cane Exercise: Extension    Stand holding cane behind back with both hands palm-up. Lift the cane away from body. Hold __2-3__ seconds. Repeat __10 times.   Cane Exercise: Extension / Internal Rotation    Stand holding cane behind back with both hands palm-up. Slide cane up spine toward head. Hold __2-3__ seconds. Repeat __10__ times. Do _1___ sessions per day. Cane Exercise: Flexion    Lie on back, holding cane above chest. Keeping arms as straight as possible, lower cane toward floor beyond head. Hold __5__ seconds. Repeat __10__ times. Do __1-2__ sessions per day. External Rotation (Eccentric), Active-Assist - Supine (Cane)    Lie on back, affected arm out from side, elbow at 90, forearm forward. Use cane to assist in lifting forearm of affected arm to neutral. Slowly lower for 3-5 seconds. __10_ reps per set, __1_ sets   [facility] [place], [place] [address]  [phone] (office) [phone] (fax)

## 2016-09-12 ENCOUNTER — Encounter: Payer: Self-pay | Admitting: Physical Therapy

## 2016-09-14 ENCOUNTER — Ambulatory Visit (INDEPENDENT_AMBULATORY_CARE_PROVIDER_SITE_OTHER): Payer: PRIVATE HEALTH INSURANCE | Admitting: Physical Therapy

## 2016-09-14 DIAGNOSIS — M25511 Pain in right shoulder: Secondary | ICD-10-CM

## 2016-09-14 DIAGNOSIS — M25611 Stiffness of right shoulder, not elsewhere classified: Secondary | ICD-10-CM | POA: Diagnosis not present

## 2016-09-14 DIAGNOSIS — M6281 Muscle weakness (generalized): Secondary | ICD-10-CM

## 2016-09-14 NOTE — Therapy (Addendum)
[place], [place], [address] Phone: [phone]   Fax:  [phone]  Physical Therapy Treatment  Patient Details  Name: Jim Hunter MRN: [id number] Date of Birth: 1966/06/16 Referring Provider: Dr. Ena Dawley  Encounter Date: 09/14/2016      PT End of Session - 09/14/16 0850    Visit Number 12   Number of Visits 18   Date for PT Re-Evaluation 10/06/16   PT Start Time 0850   PT Stop Time 0949   PT Time Calculation (min) 59 min   Activity Tolerance Patient tolerated treatment well   Behavior During Therapy [facility] for tasks assessed/performed      Past Medical History:  Diagnosis Date  . Arthritis   . GERD (gastroesophageal reflux disease)   . Hyperlipidemia   . Hypertension   . 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 Sleep apnea 3 years ago   no borderline no cpap needed per sleep studty done    Past Surgical History:  Procedure Laterality Date  . [place] SURGERY  2006  . ROTATOR CUFF REPAIR  2010   left  . 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   Procedure: RIGHT TOTAL KNEE ARTHROPLASTY;  Surgeon: Paralee Cancel, MD;  Location: [facility];  Service: Orthopedics;  Laterality: Right;  . TYMPANOPLASTY     age 5    There were no vitals filed for this visit.      Subjective Assessment - 09/14/16 0852    Subjective PT denies pain, just mostly soreness at this point.   Patient Stated Goals drive and return to work, sleep through the night ( currently tolerates ~ 4-5 hrs)    Currently in Pain? No/denies                         [facility] Adult PT Treatment/Exercise - 09/14/16 0850      Shoulder Exercises: Supine   Flexion AAROM;Right;10 reps  cane   Flexion Limitations 5" stretch at end range     Shoulder Exercises: Standing   Flexion AAROM;Right;10 reps   Flexion Limitations wall ladder   Row  Both;15 reps;Theraband   Theraband Level (Shoulder Row) Level 1 (Yellow)   Retraction Both;10 reps;Theraband   Theraband Level (Shoulder Retraction) Level 1 (Yellow)   Retraction Limitations + shoulder extension to neutral     Shoulder Exercises: Pulleys   Flexion 3 minutes   ABduction 3 minutes  scaption     Shoulder Exercises: ROM/Strengthening   Rhythmic Stabilization, Supine 3 x 30" at 90 dg flexion     Electrical Stimulation   Electrical Stimulation Location Rt shoulder   Electrical Stimulation Action IFC   Electrical Stimulation Parameters to pt tolerance x 15'   Electrical Stimulation Goals Pain     Vasopneumatic   Number Minutes Vasopneumatic  15 minutes   Vasopnuematic Location  Shoulder   Vasopneumatic Pressure Medium   Vasopneumatic Temperature  34 deg      Manual Therapy   Manual Therapy Passive ROM;Scapular mobilization   Soft tissue mobilization pecs; upper trap; leveator; clavicular area; ant deltoid/biceps; subscap & teres   Passive ROM Rt shoulder flexion, extension, IR, ER, scaption                  PT Short Term Goals - 08/28/16 1000      PT SHORT TERM GOAL #  1   Title I with initial HEP ( 09/01/16)    Time 4   Period Weeks   Status On-going     PT SHORT TERM GOAL #2   Title demo Rt shoulder PROM WFL ( 09/01/16)    Time 4   Period Weeks   Status On-going     PT SHORT TERM GOAL #3   Title tolerate initial elbow strengthening ( 09/01/16)    Time 4   Period Weeks   Status Achieved     PT SHORT TERM GOAL #4   Title improve FOTO =/< 55% limited ( 09/01/16)    Time 4   Period Weeks   Status On-going     PT SHORT TERM GOAL #5   Title reduce pain =/> 50% with dialy activity   Time 2   Period Weeks   Status Achieved           PT Long Term Goals - 08/28/16 1001      PT LONG TERM GOAL #1   Title I with advanced HEP ( 10/06/16)    Time 9   Period Weeks   Status On-going     PT LONG TERM GOAL #2   Title demo Rt shoulder AROM WFL  ( 10/06/16)    Time 9   Period Weeks   Status On-going     PT LONG TERM GOAL #3   Title demo Rt shoulder strength =/> 5-/5 ( 10/06/16)    Time 9   Period Weeks   Status On-going     PT LONG TERM GOAL #4   Title tolerate lifting with Rt UE per MD orders without increased pain ( 10/06/16)    Time 9   Period Weeks   Status On-going     PT LONG TERM GOAL #5   Title improve FOTO =/< 33% limited ( 10/06/16)    Time 9   Period Weeks   Status On-going     PT LONG TERM GOAL #6   Title report no more than 2/10 pain at the end of the day in the Rt shoulder ( 10/06/16)    Time 9   Period Weeks   Status On-going               Plan - 09/14/16 0945    Clinical Impression Statement Pt denies any issues with addition of isometrics to HEP. Tolerated introduction of scapular stabilization exercises and continued AAROM emphasizing gentle stretch at end ROM.   Rehab Potential Excellent   PT Frequency 2x / week   PT Duration --  9 wks   PT Treatment/Interventions Moist Heat;Ultrasound;Therapeutic exercise;Dry needling;Taping;Vasopneumatic Device;Manual techniques;Neuromuscular re-education;Cryotherapy;Electrical Stimulation;Iontophoresis 4mg /ml Dexamethasone;Patient/family education;Passive range of motion   PT Next Visit Plan Progress Rt shoulder ROM/strengthening within protocol.    Consulted and Agree with Plan of Care Patient      Patient will benefit from skilled therapeutic intervention in order to improve the following deficits and impairments:  Postural dysfunction, Decreased strength, Pain, Increased muscle spasms, Impaired UE functional use, Decreased range of motion  Visit Diagnosis: Acute pain of right shoulder  Stiffness of right shoulder, not elsewhere classified  Muscle weakness (generalized)     Problem List Patient Active Problem List   Diagnosis Date Noted  . Healthcare maintenance 11/03/2015  . OSA (obstructive sleep apnea) 11/03/2015  . Obese 09/30/2014  .  S/P right TKA 09/29/2014  . S/P knee replacement 09/29/2014  . Alcohol use 07/06/2014  . Osteoarthritis of right knee  05/26/2014  . Erectile dysfunction 05/26/2014  . GERD (gastroesophageal reflux disease) 10/02/2012  . Umbilical hernia [id number]  . Hypertension 07/29/2012  . Hyperlipidemia 07/29/2012  . Obesity (BMI 30-39.9) 07/29/2012    Percival Spanish, PT, MPT 09/14/2016, 5:44 PM  [facility] [place], [place], [address] Phone: [phone]   Fax:  [phone]  Name: Jim Hunter MRN: [id number] Date of Birth: August 24, 1965

## 2016-09-19 ENCOUNTER — Ambulatory Visit (INDEPENDENT_AMBULATORY_CARE_PROVIDER_SITE_OTHER): Payer: PRIVATE HEALTH INSURANCE | Admitting: Physical Therapy

## 2016-09-19 DIAGNOSIS — M25511 Pain in right shoulder: Secondary | ICD-10-CM

## 2016-09-19 DIAGNOSIS — M6281 Muscle weakness (generalized): Secondary | ICD-10-CM | POA: Diagnosis not present

## 2016-09-19 DIAGNOSIS — M25611 Stiffness of right shoulder, not elsewhere classified: Secondary | ICD-10-CM | POA: Diagnosis not present

## 2016-09-19 NOTE — Patient Instructions (Signed)
Resisted External Rotation: in Neutral - Bilateral   PALMS UP Sit or stand, tubing in both hands, elbows at sides, bent to 90, forearms forward. Pinch shoulder blades together and rotate forearms out. Keep elbows at sides. Repeat __10__ times per set. Do _2-3___ sets per session. Do _2-3___ sessions per week.   Low Row: Standing   Face anchor, feet shoulder width apart. Palms up, pull arms back, squeezing shoulder blades together. Repeat 10__ times per set. Do 2-3__ sets per session. Do 2-3__ sessions per week. Anchor Height: Waist  Strengthening: Resisted Extension   Hold tubing in hands, arm forward. Pull arm back, elbow straight. Repeat _10___ times per set. Do 2-3____ sets per session. Do 2-3____ sessions per week.   [facility] [place] [address] [place], [place] [address]  [phone] (office) [phone] (fax)

## 2016-09-19 NOTE — Therapy (Signed)
[place], [place], [address] Phone: [phone]   Fax:  [phone]  Physical Therapy Treatment  Patient Details  Name: Jim Hunter MRN: [id number] Date of Birth: 01/08/1966 Referring Provider: Dr. Ena Dawley  Encounter Date: 09/19/2016      PT End of Session - 09/19/16 0851    Visit Number 13   Number of Visits 18   Date for PT Re-Evaluation 10/06/16   PT Start Time 0848   PT Stop Time 0932   PT Time Calculation (min) 44 min   Activity Tolerance Patient tolerated treatment well   Behavior During Therapy [facility] for tasks assessed/performed      Past Medical History:  Diagnosis Date  . Arthritis   . GERD (gastroesophageal reflux disease)   . Hyperlipidemia   . Hypertension   . 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 Sleep apnea 3 years ago   no borderline no cpap needed per sleep studty done    Past Surgical History:  Procedure Laterality Date  . [place] SURGERY  2006  . ROTATOR CUFF REPAIR  2010   left  . 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   Procedure: RIGHT TOTAL KNEE ARTHROPLASTY;  Surgeon: Paralee Cancel, MD;  Location: [facility];  Service: Orthopedics;  Laterality: Right;  . TYMPANOPLASTY     age 51    There were no vitals filed for this visit.      Subjective Assessment - 09/19/16 0851    Subjective Pt reports no new changes since last treatment.  He requests shortened treatment time due to his wife having an appointment afterwards.     Currently in Pain? Yes   Pain Score 3    Pain Location Shoulder   Pain Orientation Right   Pain Descriptors / Indicators Aching   Aggravating Factors  sleeping position   Pain Relieving Factors ice, estim            [facility] PT Assessment - 09/19/16 0001      Assessment   Medical Diagnosis Rt RTC Repair   Referring Provider Dr. Ena Dawley   Onset  Date/Surgical Date 06/16/16   Hand Dominance Right   Next MD Visit 10/03/16     PROM   Right Shoulder Extension 60 Degrees   Right Shoulder Flexion 148 Degrees   Right Shoulder ABduction 146 Degrees   Right Shoulder External Rotation 53 Degrees         [facility] Adult PT Treatment/Exercise - 09/19/16 0001      Shoulder Exercises: Supine   Flexion AAROM;Both;10 reps  with cane     Shoulder Exercises: Standing   External Rotation Strengthening;Both;10 reps;Theraband   Theraband Level (Shoulder External Rotation) Level 1 (Yellow)   Extension Strengthening;Both;10 reps;Theraband   Theraband Level (Shoulder Extension) Level 1 (Yellow)  to neutral   Row Strengthening;Both;10 reps;Theraband   Theraband Level (Shoulder Row) Level 1 (Yellow);Level 2 (Red)   Other Standing Exercises wall ladder abduction RUE x 10, flexion RUE x 10 reps (VC for form/posture)   Other Standing Exercises pendulum RUE between AROM exercises.      Shoulder Exercises: ROM/Strengthening   Rhythmic Stabilization, Supine --     Electrical Stimulation   Electrical Stimulation Location Rt shoulder   Electrical Stimulation Action IFC   Electrical Stimulation Parameters to pt tolerance   Electrical Stimulation Goals Pain     Vasopneumatic  Number Minutes Vasopneumatic  10 minutes   Vasopnuematic Location  Shoulder   Vasopneumatic Pressure Low   Vasopneumatic Temperature  34 deg      Manual Therapy   Soft tissue mobilization cross fiber to Rt pec major (very tight/tender)   Passive ROM Rt shoulder ER                PT Education - 09/19/16 0922    Education provided Yes   Education Details HEP - post shoulder girdle   Person(s) Educated Patient   Methods Explanation;Handout   Comprehension Verbalized understanding;Returned demonstration          PT Short Term Goals - 08/28/16 1000      PT SHORT TERM GOAL #1   Title I with initial HEP ( 09/01/16)    Time 4   Period Weeks   Status On-going      PT SHORT TERM GOAL #2   Title demo Rt shoulder PROM WFL ( 09/01/16)    Time 4   Period Weeks   Status On-going     PT SHORT TERM GOAL #3   Title tolerate initial elbow strengthening ( 09/01/16)    Time 4   Period Weeks   Status Achieved     PT SHORT TERM GOAL #4   Title improve FOTO =/< 55% limited ( 09/01/16)    Time 4   Period Weeks   Status On-going     PT SHORT TERM GOAL #5   Title reduce pain =/> 50% with dialy activity   Time 2   Period Weeks   Status Achieved           PT Long Term Goals - 08/28/16 1001      PT LONG TERM GOAL #1   Title I with advanced HEP ( 10/06/16)    Time 9   Period Weeks   Status On-going     PT LONG TERM GOAL #2   Title demo Rt shoulder AROM WFL ( 10/06/16)    Time 9   Period Weeks   Status On-going     PT LONG TERM GOAL #3   Title demo Rt shoulder strength =/> 5-/5 ( 10/06/16)    Time 9   Period Weeks   Status On-going     PT LONG TERM GOAL #4   Title tolerate lifting with Rt UE per MD orders without increased pain ( 10/06/16)    Time 9   Period Weeks   Status On-going     PT LONG TERM GOAL #5   Title improve FOTO =/< 33% limited ( 10/06/16)    Time 9   Period Weeks   Status On-going     PT LONG TERM GOAL #6   Title report no more than 2/10 pain at the end of the day in the Rt shoulder ( 10/06/16)    Time 9   Period Weeks   Status On-going               Plan - 09/19/16 [id number]    Clinical Impression Statement Pt demonstrated improved active Rt shoulder flex/abdct in standing.  He has pain at end range of motions.  He tolerated exercises with minimal pain increase in shoulder. Pt reported decreased pain with use of vaso/estim at end of session.  Pt progressing towards goals.    Rehab Potential Excellent   PT Frequency 2x / week   PT Duration --  9 wks   PT Treatment/Interventions Moist Heat;Ultrasound;Therapeutic exercise;Dry  needling;Taping;Vasopneumatic Device;Manual techniques;Neuromuscular  re-education;Cryotherapy;Electrical Stimulation;Iontophoresis 4mg /ml Dexamethasone;Patient/family education;Passive range of motion   PT Next Visit Plan Progress Rt shoulder ROM/strengthening within protocol.    Consulted and Agree with Plan of Care Patient      Patient will benefit from skilled therapeutic intervention in order to improve the following deficits and impairments:  Postural dysfunction, Decreased strength, Pain, Increased muscle spasms, Impaired UE functional use, Decreased range of motion  Visit Diagnosis: Acute pain of right shoulder  Stiffness of right shoulder, not elsewhere classified  Muscle weakness (generalized)     Problem List Patient Active Problem List   Diagnosis Date Noted  . Healthcare maintenance 11/03/2015  . OSA (obstructive sleep apnea) 11/03/2015  . Obese 09/30/2014  . S/P right TKA 09/29/2014  . S/P knee replacement 09/29/2014  . Alcohol use 07/06/2014  . Osteoarthritis of right knee 05/26/2014  . Erectile dysfunction 05/26/2014  . GERD (gastroesophageal reflux disease) 10/02/2012  . Umbilical hernia [id number]  . Hypertension 07/29/2012  . Hyperlipidemia 07/29/2012  . Obesity (BMI 30-39.9) 07/29/2012   Kerin Perna, PTA 09/19/16 9:27 AM  [place], [place], [address] Phone: [phone]   Fax:  [phone]  Name: Jim Hunter MRN: [id number] Date of Birth: [birth year]

## 2016-09-20 ENCOUNTER — Ambulatory Visit (INDEPENDENT_AMBULATORY_CARE_PROVIDER_SITE_OTHER): Payer: PRIVATE HEALTH INSURANCE | Admitting: Physical Therapy

## 2016-09-20 DIAGNOSIS — M25511 Pain in right shoulder: Secondary | ICD-10-CM

## 2016-09-20 DIAGNOSIS — M6281 Muscle weakness (generalized): Secondary | ICD-10-CM | POA: Diagnosis not present

## 2016-09-20 DIAGNOSIS — M25611 Stiffness of right shoulder, not elsewhere classified: Secondary | ICD-10-CM | POA: Diagnosis not present

## 2016-09-20 NOTE — Therapy (Signed)
[place], [place], [address] Phone: [phone]   Fax:  [phone]  Physical Therapy Treatment  Patient Details  Name: Jim Hunter MRN: [id number] Date of Birth: 1965/08/21 Referring Provider: Dr. Ena Dawley  Encounter Date: 09/20/2016      PT End of Session - 09/20/16 0718    Visit Number 14   Number of Visits 18   Date for PT Re-Evaluation 10/06/16   PT Start Time 0717   PT Stop Time 0818   PT Time Calculation (min) 61 min   Activity Tolerance Patient tolerated treatment well   Behavior During Therapy [facility] for tasks assessed/performed      Past Medical History:  Diagnosis Date  . Arthritis   . GERD (gastroesophageal reflux disease)   . Hyperlipidemia   . Hypertension   . 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 Sleep apnea 3 years ago   no borderline no cpap needed per sleep studty done    Past Surgical History:  Procedure Laterality Date  . [place] SURGERY  2006  . ROTATOR CUFF REPAIR  2010   left  . 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   Procedure: RIGHT TOTAL KNEE ARTHROPLASTY;  Surgeon: Paralee Cancel, MD;  Location: [facility];  Service: Orthopedics;  Laterality: Right;  . TYMPANOPLASTY     age 57    There were no vitals filed for this visit.      Subjective Assessment - 09/20/16 0719    Subjective Pt reports he felt good after last session.  His Rt shoulder is sore from his sleep position, "I was getting tired of laying on my Lt side".   Currently in Pain? Yes   Pain Score 2    Pain Location Shoulder   Pain Orientation Right   Pain Descriptors / Indicators Aching   Aggravating Factors  sleeping position   Pain Relieving Factors ice, estim            [facility] PT Assessment - 09/20/16 0001      Assessment   Medical Diagnosis Rt RTC Repair   Onset Date/Surgical Date 06/16/16   Hand Dominance  Right   Next MD Visit 10/03/16     ROM / Strength   AROM / PROM / Strength AROM     AROM   AROM Assessment Site Shoulder   Right/Left Shoulder Right   Right Shoulder External Rotation 72 Degrees  supine after manual           [facility] Adult PT Treatment/Exercise - 09/20/16 0001      Shoulder Exercises: Standing   Flexion Strengthening;Left;10 reps  to 90 deg   Shoulder Flexion Weight (lbs) 1   Flexion Limitations tactile cues for form.    ABduction Strengthening;Right;10 reps   Shoulder ABduction Weight (lbs) 1   ABduction Limitations tactile cues for form.      Shoulder Exercises: ROM/Strengthening   UBE (Upper Arm Bike) L1: forward 2 min, backward 2 min   Rhythmic Stabilization, Supine 2 x 30" at 90 dg flexion, 1 rep @ 45 deg, 1 rep @ 120 deg     Electrical Stimulation   Electrical Stimulation Location Rt shoulder   Electrical Stimulation Action IFC   Electrical Stimulation Parameters to pt tolerance   Electrical Stimulation Goals Pain     Vasopneumatic   Number Minutes Vasopneumatic  15 minutes   Vasopnuematic Location  Shoulder   Vasopneumatic Pressure Low   Vasopneumatic Temperature  34 deg      Manual Therapy   Manual Therapy Passive ROM   Soft tissue mobilization cross fiber to Rt pec major    Myofascial Release Rt pec release   Passive ROM Rt shoulder flexion, extension, IR, ER, scaption                PT Education - 09/19/16 0922    Education provided Yes   Education Details HEP - post shoulder girdle   Person(s) Educated Patient   Methods Explanation;Handout   Comprehension Verbalized understanding;Returned demonstration          PT Short Term Goals - 08/28/16 1000      PT SHORT TERM GOAL #1   Title I with initial HEP ( 09/01/16)    Time 4   Period Weeks   Status On-going     PT SHORT TERM GOAL #2   Title demo Rt shoulder PROM WFL ( 09/01/16)    Time 4   Period Weeks   Status On-going     PT SHORT TERM GOAL #3   Title tolerate  initial elbow strengthening ( 09/01/16)    Time 4   Period Weeks   Status Achieved     PT SHORT TERM GOAL #4   Title improve FOTO =/< 55% limited ( 09/01/16)    Time 4   Period Weeks   Status On-going     PT SHORT TERM GOAL #5   Title reduce pain =/> 50% with dialy activity   Time 2   Period Weeks   Status Achieved           PT Long Term Goals - 08/28/16 1001      PT LONG TERM GOAL #1   Title I with advanced HEP ( 10/06/16)    Time 9   Period Weeks   Status On-going     PT LONG TERM GOAL #2   Title demo Rt shoulder AROM WFL ( 10/06/16)    Time 9   Period Weeks   Status On-going     PT LONG TERM GOAL #3   Title demo Rt shoulder strength =/> 5-/5 ( 10/06/16)    Time 9   Period Weeks   Status On-going     PT LONG TERM GOAL #4   Title tolerate lifting with Rt UE per MD orders without increased pain ( 10/06/16)    Time 9   Period Weeks   Status On-going     PT LONG TERM GOAL #5   Title improve FOTO =/< 33% limited ( 10/06/16)    Time 9   Period Weeks   Status On-going     PT LONG TERM GOAL #6   Title report no more than 2/10 pain at the end of the day in the Rt shoulder ( 10/06/16)    Time 9   Period Weeks   Status On-going               Plan - 09/19/16 2671    Clinical Impression Statement Pt demonstrated improved active Rt shoulder flex/abdct in standing.  He has pain at end range of motions.  He tolerated exercises with minimal pain increase in shoulder. Pt reported decreased pain with use of vaso/estim at end of session.  Pt progressing towards goals.    Rehab Potential Excellent   PT Frequency 2x / week   PT Duration --  9 wks  PT Treatment/Interventions Moist Heat;Ultrasound;Therapeutic exercise;Dry needling;Taping;Vasopneumatic Device;Manual techniques;Neuromuscular re-education;Cryotherapy;Electrical Stimulation;Iontophoresis 4mg /ml Dexamethasone;Patient/family education;Passive range of motion   PT Next Visit Plan Progress Rt shoulder  ROM/strengthening within protocol.    Consulted and Agree with Plan of Care Patient      Patient will benefit from skilled therapeutic intervention in order to improve the following deficits and impairments:     Visit Diagnosis: Acute pain of right shoulder  Stiffness of right shoulder, not elsewhere classified  Muscle weakness (generalized)     Problem List Patient Active Problem List   Diagnosis Date Noted  . Healthcare maintenance 11/03/2015  . OSA (obstructive sleep apnea) 11/03/2015  . Obese 09/30/2014  . S/P right TKA 09/29/2014  . S/P knee replacement 09/29/2014  . Alcohol use 07/06/2014  . Osteoarthritis of right knee 05/26/2014  . Erectile dysfunction 05/26/2014  . GERD (gastroesophageal reflux disease) 10/02/2012  . Umbilical hernia 39/06/2582  . Hypertension 07/29/2012  . Hyperlipidemia 07/29/2012  . Obesity (BMI 30-39.9) 07/29/2012   Kerin Perna, PTA 09/20/16 1:53 PM  [place], [place], [address] Phone: [phone]   Fax:  [phone]  Name: Jim Hunter MRN: [id number] Date of Birth: [birth year]

## 2016-09-27 ENCOUNTER — Ambulatory Visit (INDEPENDENT_AMBULATORY_CARE_PROVIDER_SITE_OTHER): Payer: PRIVATE HEALTH INSURANCE | Admitting: Rehabilitative and Restorative Service Providers"

## 2016-09-27 ENCOUNTER — Encounter: Payer: Self-pay | Admitting: Rehabilitative and Restorative Service Providers"

## 2016-09-27 DIAGNOSIS — M25511 Pain in right shoulder: Secondary | ICD-10-CM | POA: Diagnosis not present

## 2016-09-27 DIAGNOSIS — M25611 Stiffness of right shoulder, not elsewhere classified: Secondary | ICD-10-CM | POA: Diagnosis not present

## 2016-09-27 DIAGNOSIS — M6281 Muscle weakness (generalized): Secondary | ICD-10-CM

## 2016-09-27 NOTE — Therapy (Signed)
[place], [place], [address] Phone: [phone]   Fax:  [phone]  Physical Therapy Treatment  Patient Details  Name: Jim Hunter MRN: [id number] Date of Birth: April 26, 1966 Referring Provider: Dr Ena Dawley  Encounter Date: 09/27/2016      PT End of Session - 09/27/16 0856    Visit Number 15   Number of Visits 18   Date for PT Re-Evaluation 10/06/16   PT Start Time 0853  pt 8 min late for appt   PT Stop Time 0946   PT Time Calculation (min) 53 min   Activity Tolerance Patient tolerated treatment well      Past Medical History:  Diagnosis Date  . Arthritis   . GERD (gastroesophageal reflux disease)   . Hyperlipidemia   . Hypertension   . 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 Sleep apnea 3 years ago   no borderline no cpap needed per sleep studty done    Past Surgical History:  Procedure Laterality Date  . [place] SURGERY  2006  . ROTATOR CUFF REPAIR  2010   left  . 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   Procedure: RIGHT TOTAL KNEE ARTHROPLASTY;  Surgeon: Paralee Cancel, MD;  Location: [facility];  Service: Orthopedics;  Laterality: Right;  . TYMPANOPLASTY     age 51    There were no vitals filed for this visit.      Subjective Assessment - 09/27/16 0857    Subjective Patient reports that his shoulder is stiff. He has had some shart pains down into the Rt side sometimes in the past few days.    Currently in Pain? Yes   Pain Score 2    Pain Location Shoulder   Pain Orientation Right   Pain Descriptors / Indicators Aching   Pain Type Chronic pain   Pain Onset More than a month ago   Pain Frequency Constant            [facility] PT Assessment - 09/27/16 0001      Assessment   Medical Diagnosis Rt RTC Repair   Referring Provider Dr Ena Dawley   Onset Date/Surgical Date 06/16/16   Hand  Dominance Right   Next MD Visit 10/03/16     AROM   Right/Left Shoulder --  Rt UE supported at 90 deg abd for IR/ER measurement    Right Shoulder Extension 66 Degrees   Right Shoulder Flexion 143 Degrees   Right Shoulder ABduction 152 Degrees   Right Shoulder Internal Rotation 30 Degrees   Right Shoulder External Rotation 67 Degrees                     [facility] Adult PT Treatment/Exercise - 09/27/16 0001      Shoulder Exercises: Standing   External Rotation Strengthening;Right;10 reps;Theraband  3 sets    Theraband Level (Shoulder External Rotation) Level 2 (Red)   Extension Strengthening;Both;20 reps   Theraband Level (Shoulder Extension) Level 3 (Green)   Row Strengthening;Both;20 reps   Theraband Level (Shoulder Row) Level 3 (Green)   Other Standing Exercises wall push up 10 x 2 sets      Shoulder Exercises: Pulleys   Flexion 3 minutes     Shoulder Exercises: Therapy Ball   Flexion 5 reps  stretching 30 sec hold    Other Therapy Ball Exercises rolling ball on wall overhead x  20-30    Other Therapy Ball Exercises bouncing ball on wall overhead x 20-30      Shoulder Exercises: ROM/Strengthening   UBE (Upper Arm Bike) L1: forward 2 min, backward 2 min     Shoulder Exercises: Stretch   Other Shoulder Stretches lat stretch supine 30 sec x 3      Electrical Stimulation   Electrical Stimulation Location Rt shoulder   Electrical Stimulation Action IFC   Electrical Stimulation Parameters to tolerance   Electrical Stimulation Goals Pain     Vasopneumatic   Number Minutes Vasopneumatic  15 minutes   Vasopnuematic Location  Shoulder  Rt   Vasopneumatic Pressure Low   Vasopneumatic Temperature  34 deg      Manual Therapy   Manual Therapy Passive ROM   Manual therapy comments pt supine    Soft tissue mobilization cross fiber to Rt pec major    Myofascial Release Rt pec release   Passive ROM Rt shoulder flexion, extension, IR, ER, scaption, horizontal abduction                    PT Short Term Goals - 09/27/16 0902      PT SHORT TERM GOAL #1   Title I with initial HEP ( 09/01/16)    Time 4   Period Weeks   Status Achieved     PT SHORT TERM GOAL #2   Title demo Rt shoulder PROM WFL ( 09/01/16)    Time 4   Period Weeks   Status Partially Met     PT SHORT TERM GOAL #3   Title tolerate initial elbow strengthening ( 09/01/16)    Time 4   Period Weeks   Status Achieved     PT SHORT TERM GOAL #4   Title improve FOTO =/< 55% limited ( 09/01/16)    Time 4   Period Weeks   Status On-going     PT SHORT TERM GOAL #5   Title reduce pain =/> 50% with daily activity   Time 2   Period Weeks   Status Achieved           PT Long Term Goals - 09/27/16 8001      PT LONG TERM GOAL #1   Title I with advanced HEP ( 10/06/16)    Time 9   Period Weeks   Status On-going     PT LONG TERM GOAL #2   Title demo Rt shoulder AROM WFL ( 10/06/16)    Time 9   Period Weeks   Status On-going     PT LONG TERM GOAL #3   Title demo Rt shoulder strength =/> 5-/5 ( 10/06/16)    Time 9   Period Weeks   Status On-going     PT LONG TERM GOAL #4   Title tolerate lifting with Rt UE per MD orders without increased pain ( 10/06/16)    Time 9   Period Weeks   Status On-going     PT LONG TERM GOAL #5   Title improve FOTO =/< 33% limited ( 10/06/16)    Time 9   Period Weeks   Status On-going     PT LONG TERM GOAL #6   Title report no more than 2/10 pain at the end of the day in the Rt shoulder ( 10/06/16)    Time 9   Period Weeks   Status On-going  Plan - 09/27/16 0900    Clinical Impression Statement Patient continues to make gradual porgress toward stated goals of therapy. He continues to have some c/o pain but demonstrates increasing mobility and exercise tolerance.    Rehab Potential Excellent   PT Frequency 2x / week   PT Duration --  9 weeks    PT Treatment/Interventions Moist Heat;Ultrasound;Therapeutic  exercise;Dry needling;Taping;Vasopneumatic Device;Manual techniques;Neuromuscular re-education;Cryotherapy;Electrical Stimulation;Iontophoresis 87m/ml Dexamethasone;Patient/family education;Passive range of motion   PT Next Visit Plan Progress Rt shoulder ROM/strengthening and stretching as tolerated by patient    Consulted and Agree with Plan of Care Patient      Patient will benefit from skilled therapeutic intervention in order to improve the following deficits and impairments:  Postural dysfunction, Decreased strength, Pain, Increased muscle spasms, Impaired UE functional use, Decreased range of motion  Visit Diagnosis: Acute pain of right shoulder  Stiffness of right shoulder, not elsewhere classified  Muscle weakness (generalized)     Problem List Patient Active Problem List   Diagnosis Date Noted  . Healthcare maintenance 11/03/2015  . OSA (obstructive sleep apnea) 11/03/2015  . Obese 09/30/2014  . S/P right TKA 09/29/2014  . S/P knee replacement 09/29/2014  . Alcohol use 07/06/2014  . Osteoarthritis of right knee 05/26/2014  . Erectile dysfunction 05/26/2014  . GERD (gastroesophageal reflux disease) 10/02/2012  . Umbilical hernia 001/59/9689 . Hypertension 07/29/2012  . Hyperlipidemia 07/29/2012  . Obesity (BMI 30-39.9) 07/29/2012    Khalidah Herbold PNilda SimmerPT, MPH  09/27/2016, 9:36 AM  [facility] [address]Phone: [phone]  Fax:  [phone] Name: RCordarious ZeekMRN: [id number] of Birth: [birth year]

## 2016-09-28 ENCOUNTER — Ambulatory Visit (INDEPENDENT_AMBULATORY_CARE_PROVIDER_SITE_OTHER): Payer: PRIVATE HEALTH INSURANCE | Admitting: Physical Therapy

## 2016-09-28 DIAGNOSIS — M25511 Pain in right shoulder: Secondary | ICD-10-CM

## 2016-09-28 DIAGNOSIS — M25611 Stiffness of right shoulder, not elsewhere classified: Secondary | ICD-10-CM | POA: Diagnosis not present

## 2016-09-28 DIAGNOSIS — M6281 Muscle weakness (generalized): Secondary | ICD-10-CM | POA: Diagnosis not present

## 2016-09-28 NOTE — Therapy (Signed)
[place], [place], [address] Phone: [phone]   Fax:  [phone]  Physical Therapy Treatment  Patient Details  Name: Jim Hunter MRN: [id number] Date of Birth: 12-01-1965 Referring Provider: Dr. Ena Dawley  Encounter Date: 09/28/2016      PT End of Session - 09/28/16 0858    Visit Number 16   Number of Visits 18   Date for PT Re-Evaluation 10/06/16   PT Start Time 0850   PT Stop Time 0939   PT Time Calculation (min) 49 min   Activity Tolerance Patient tolerated treatment well   Behavior During Therapy [facility] for tasks assessed/performed      Past Medical History:  Diagnosis Date  . Arthritis   . GERD (gastroesophageal reflux disease)   . Hyperlipidemia   . Hypertension   . 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 Sleep apnea 3 years ago   no borderline no cpap needed per sleep studty done    Past Surgical History:  Procedure Laterality Date  . [place] SURGERY  2006  . ROTATOR CUFF REPAIR  2010   left  . 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   Procedure: RIGHT TOTAL KNEE ARTHROPLASTY;  Surgeon: Paralee Cancel, MD;  Location: [facility];  Service: Orthopedics;  Laterality: Right;  . TYMPANOPLASTY     age 51    There were no vitals filed for this visit.      Subjective Assessment - 09/28/16 0859    Subjective Pt reports no new changes since last visit.    Currently in Pain? Yes   Pain Score 1    Pain Location Shoulder   Pain Orientation Right;Distal   Pain Descriptors / Indicators Discomfort   Aggravating Factors  sleeping position, sudden movement   Pain Relieving Factors ice, estim            [facility] PT Assessment - 09/28/16 0001      Assessment   Medical Diagnosis Rt RTC Repair   Referring Provider Dr. Ena Dawley   Hand Dominance Right   Next MD Visit 10/12/16          [facility] Adult PT  Treatment/Exercise - 09/28/16 0001      Shoulder Exercises: Standing   External Rotation Strengthening;Right;10 reps;Theraband  3 sets    Theraband Level (Shoulder External Rotation) Level 2 (Red)   Flexion Strengthening;Left;12 reps  to 90 deg   Shoulder Flexion Weight (lbs) 1   ABduction Strengthening;Right;10 reps   Shoulder ABduction Weight (lbs) 1   Extension Strengthening;Both;20 reps   Theraband Level (Shoulder Extension) Level 3 (Green)   Row Strengthening;Both;20 reps   Theraband Level (Shoulder Row) Level 2 (Red)     Shoulder Exercises: Therapy Ball   ABduction Limitations circles at 90 deg CW/CCW x 15 reps, , 2 sets   Other Therapy Ball Exercises rolling ball on wall overhead x 10 reps       Shoulder Exercises: ROM/Strengthening   UBE (Upper Arm Bike) L1: forward 2 min, backward 2 min     Shoulder Exercises: Stretch   Other Shoulder Stretches lat stretch supine 30 sec x 3      Electrical Stimulation   Electrical Stimulation Location Rt shoulder   Electrical Stimulation Action IFC   Electrical Stimulation Parameters  to tolerance    Electrical Stimulation Goals Pain     Vasopneumatic   Number Minutes  Vasopneumatic  15 minutes   Vasopnuematic Location  Shoulder   Vasopneumatic Pressure Low   Vasopneumatic Temperature  34 deg                   PT Short Term Goals - 09/27/16 0902      PT SHORT TERM GOAL #1   Title I with initial HEP ( 09/01/16)    Time 4   Period Weeks   Status Achieved     PT SHORT TERM GOAL #2   Title demo Rt shoulder PROM WFL ( 09/01/16)    Time 4   Period Weeks   Status Partially Met     PT SHORT TERM GOAL #3   Title tolerate initial elbow strengthening ( 09/01/16)    Time 4   Period Weeks   Status Achieved     PT SHORT TERM GOAL #4   Title improve FOTO =/< 55% limited ( 09/01/16)    Time 4   Period Weeks   Status On-going     PT SHORT TERM GOAL #5   Title reduce pain =/> 50% with daily activity   Time 2   Period  Weeks   Status Achieved           PT Long Term Goals - 09/27/16 1610      PT LONG TERM GOAL #1   Title I with advanced HEP ( 10/06/16)    Time 9   Period Weeks   Status On-going     PT LONG TERM GOAL #2   Title demo Rt shoulder AROM WFL ( 10/06/16)    Time 9   Period Weeks   Status On-going     PT LONG TERM GOAL #3   Title demo Rt shoulder strength =/> 5-/5 ( 10/06/16)    Time 9   Period Weeks   Status On-going     PT LONG TERM GOAL #4   Title tolerate lifting with Rt UE per MD orders without increased pain ( 10/06/16)    Time 9   Period Weeks   Status On-going     PT LONG TERM GOAL #5   Title improve FOTO =/< 33% limited ( 10/06/16)    Time 9   Period Weeks   Status On-going     PT LONG TERM GOAL #6   Title report no more than 2/10 pain at the end of the day in the Rt shoulder ( 10/06/16)    Time 9   Period Weeks   Status On-going               Plan - 09/28/16 0925    Clinical Impression Statement Pt tolerated all exercises with minimal increase in pain.  Rt shoulder AROM improving with less pain.  Pt making progress towards established goals.    Rehab Potential Excellent   PT Frequency 2x / week   PT Duration --  9 wks   PT Treatment/Interventions Moist Heat;Ultrasound;Therapeutic exercise;Dry needling;Taping;Vasopneumatic Device;Manual techniques;Neuromuscular re-education;Cryotherapy;Electrical Stimulation;Iontophoresis 30m/ml Dexamethasone;Patient/family education;Passive range of motion   PT Next Visit Plan Progress Rt shoulder ROM/strengthening and stretching as tolerated by patient    Consulted and Agree with Plan of Care Patient      Patient will benefit from skilled therapeutic intervention in order to improve the following deficits and impairments:  Postural dysfunction, Decreased strength, Pain, Increased muscle spasms, Impaired UE functional use, Decreased range of motion  Visit Diagnosis: Acute pain of right shoulder  Stiffness of right  shoulder,  not elsewhere classified  Muscle weakness (generalized)     Problem List Patient Active Problem List   Diagnosis Date Noted  . Healthcare maintenance 11/03/2015  . OSA (obstructive sleep apnea) 11/03/2015  . Obese 09/30/2014  . S/P right TKA 09/29/2014  . S/P knee replacement 09/29/2014  . Alcohol use 07/06/2014  . Osteoarthritis of right knee 05/26/2014  . Erectile dysfunction 05/26/2014  . GERD (gastroesophageal reflux disease) 10/02/2012  . Umbilical hernia 61/16/4353  . Hypertension 07/29/2012  . Hyperlipidemia 07/29/2012  . Obesity (BMI 30-39.9) 07/29/2012   Kerin Perna, PTA 09/28/16 9:28 AM  [facility] [place], [place], [address] Phone: [phone]   Fax:  [phone]  Name: Jim Hunter MRN: [id number] Date of Birth: 1965-08-27

## 2016-09-29 ENCOUNTER — Encounter: Payer: Self-pay | Admitting: Physical Therapy

## 2016-10-03 ENCOUNTER — Ambulatory Visit (INDEPENDENT_AMBULATORY_CARE_PROVIDER_SITE_OTHER): Payer: PRIVATE HEALTH INSURANCE | Admitting: Physical Therapy

## 2016-10-03 DIAGNOSIS — M6281 Muscle weakness (generalized): Secondary | ICD-10-CM

## 2016-10-03 DIAGNOSIS — M25611 Stiffness of right shoulder, not elsewhere classified: Secondary | ICD-10-CM

## 2016-10-03 DIAGNOSIS — M25511 Pain in right shoulder: Secondary | ICD-10-CM | POA: Diagnosis not present

## 2016-10-03 NOTE — Therapy (Signed)
[place], [place], [address] Phone: [phone]   Fax:  [phone]  Physical Therapy Treatment  Patient Details  Name: Jim Hunter MRN: [id number] Date of Birth: 16-Jul-1966 Referring Provider: Dr. Ena Dawley  Encounter Date: 10/03/2016      PT End of Session - 10/03/16 0850    Visit Number 17   Number of Visits 18   Date for PT Re-Evaluation 10/06/16   PT Start Time 0848   PT Stop Time 0942   PT Time Calculation (min) 54 min   Activity Tolerance Patient tolerated treatment well   Behavior During Therapy [facility] for tasks assessed/performed      Past Medical History:  Diagnosis Date  . Arthritis   . GERD (gastroesophageal reflux disease)   . Hyperlipidemia   . Hypertension   . 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 Sleep apnea 3 years ago   no borderline no cpap needed per sleep studty done    Past Surgical History:  Procedure Laterality Date  . [place] SURGERY  2006  . ROTATOR CUFF REPAIR  2010   left  . 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   Procedure: RIGHT TOTAL KNEE ARTHROPLASTY;  Surgeon: Paralee Cancel, MD;  Location: [facility];  Service: Orthopedics;  Laterality: Right;  . TYMPANOPLASTY     age 27    There were no vitals filed for this visit.      Subjective Assessment - 10/03/16 0850    Subjective Pt reports he has been very busy with his wife's care (infusions, etc).  He has only done band exercises 2x in last week.    Patient Stated Goals drive and return to work, sleep through the night ( currently tolerates ~ 4-5 hrs)    Currently in Pain? No/denies   Pain Score 0-No pain   Pain Location Shoulder            [facility] PT Assessment - 10/03/16 0001      Assessment   Medical Diagnosis Rt RTC Repair   Referring Provider Dr. Ena Dawley   Next MD Visit 10/12/16     AROM   Right Shoulder  Flexion 154 Degrees  standing   Right Shoulder ABduction 142 Degrees  standing, with pain at 90 deg   Right Shoulder External Rotation 80 Degrees  supine, shoulder propped on pool noodle, abdt to 90.            [facility] Adult PT Treatment/Exercise - 10/03/16 0001      Shoulder Exercises: Supine   External Rotation Strengthening;Both;Theraband;15 reps   Theraband Level (Shoulder External Rotation) Level 2 (Red)   Other Supine Exercises hands behind head, elbow pressing down x 5 reps      Shoulder Exercises: Standing   Other Standing Exercises reverse wall push ups x 10 reps      Shoulder Exercises: Pulleys   Flexion 3 minutes   ABduction 3 minutes     Shoulder Exercises: Therapy Ball   ABduction Limitations circles at 90 deg CW/CCW x 15 reps, , 2 sets   Other Therapy Ball Exercises rolling ball on wall overhead x 10 reps       Shoulder Exercises: ROM/Strengthening   UBE (Upper Arm Bike) L2: forward 2 min, backward 2 min   Rhythmic Stabilization, Supine 2 x 30" at 90 dg flexion, 1 rep @ 45 deg, 1 rep @  120 deg     Shoulder Exercises: Stretch   Other Shoulder Stretches lat stretch supine 30 sec x 3      Electrical Stimulation   Electrical Stimulation Location Rt shoulder   Electrical Stimulation Action IFC   Electrical Stimulation Parameters  to tolerance    Electrical Stimulation Goals Pain     Vasopneumatic   Number Minutes Vasopneumatic  15 minutes   Vasopnuematic Location  Shoulder   Vasopneumatic Pressure Low   Vasopneumatic Temperature  34 deg                   PT Short Term Goals - 09/27/16 0902      PT SHORT TERM GOAL #1   Title I with initial HEP ( 09/01/16)    Time 4   Period Weeks   Status Achieved     PT SHORT TERM GOAL #2   Title demo Rt shoulder PROM WFL ( 09/01/16)    Time 4   Period Weeks   Status Partially Met     PT SHORT TERM GOAL #3   Title tolerate initial elbow strengthening ( 09/01/16)    Time 4   Period Weeks   Status  Achieved     PT SHORT TERM GOAL #4   Title improve FOTO =/< 55% limited ( 09/01/16)    Time 4   Period Weeks   Status On-going     PT SHORT TERM GOAL #5   Title reduce pain =/> 50% with daily activity   Time 2   Period Weeks   Status Achieved           PT Long Term Goals - 10/03/16 0900      PT LONG TERM GOAL #1   Title I with advanced HEP ( 10/06/16)    Time 9   Period Weeks   Status On-going     PT LONG TERM GOAL #2   Title demo Rt shoulder AROM WFL ( 10/06/16)    Time 9   Period Weeks   Status On-going     PT LONG TERM GOAL #3   Title demo Rt shoulder strength =/> 5-/5 ( 10/06/16)    Time 9   Period Weeks   Status On-going     PT LONG TERM GOAL #4   Title tolerate lifting with Rt UE per MD orders without increased pain ( 10/06/16)    Time 9   Period Weeks   Status On-going     PT LONG TERM GOAL #5   Title improve FOTO =/< 33% limited ( 10/06/16)    Time 9   Period Weeks   Status On-going     PT LONG TERM GOAL #6   Title report no more than 2/10 pain at the end of the day in the Rt shoulder ( 10/06/16)    Time 9   Period Weeks   Status Achieved               Plan - 10/03/16 1610    Clinical Impression Statement Pt tolerated all exercises with mild increase in pain.  He demonstrated improved Rt shoulder ROM. He is reporting reduction in overall pain level; has met LTG # 6   Rehab Potential Excellent   PT Frequency 2x / week   PT Duration --  9 wks   PT Treatment/Interventions Moist Heat;Ultrasound;Therapeutic exercise;Dry needling;Taping;Vasopneumatic Device;Manual techniques;Neuromuscular re-education;Cryotherapy;Electrical Stimulation;Iontophoresis 22m/ml Dexamethasone;Patient/family education;Passive range of motion   PT Next Visit Plan End of POC, FOTO,  assess progress and goals.    Consulted and Agree with Plan of Care Patient      Patient will benefit from skilled therapeutic intervention in order to improve the following deficits and  impairments:  Postural dysfunction, Decreased strength, Pain, Increased muscle spasms, Impaired UE functional use, Decreased range of motion  Visit Diagnosis: Acute pain of right shoulder  Stiffness of right shoulder, not elsewhere classified  Muscle weakness (generalized)     Problem List Patient Active Problem List   Diagnosis Date Noted  . Healthcare maintenance 11/03/2015  . OSA (obstructive sleep apnea) 11/03/2015  . Obese 09/30/2014  . S/P right TKA 09/29/2014  . S/P knee replacement 09/29/2014  . Alcohol use 07/06/2014  . Osteoarthritis of right knee 05/26/2014  . Erectile dysfunction 05/26/2014  . GERD (gastroesophageal reflux disease) 10/02/2012  . Umbilical hernia 67/54/4920  . Hypertension 07/29/2012  . Hyperlipidemia 07/29/2012  . Obesity (BMI 30-39.9) 07/29/2012   Kerin Perna, PTA 10/03/16 9:34 AM  [facility] [place], [place], [address] Phone: [phone]   Fax:  [phone]  Name: Jim Hunter MRN: [id number] Date of Birth: 1966-03-15

## 2016-10-04 ENCOUNTER — Ambulatory Visit (INDEPENDENT_AMBULATORY_CARE_PROVIDER_SITE_OTHER): Payer: PRIVATE HEALTH INSURANCE | Admitting: Physical Therapy

## 2016-10-04 DIAGNOSIS — M25611 Stiffness of right shoulder, not elsewhere classified: Secondary | ICD-10-CM | POA: Diagnosis not present

## 2016-10-04 DIAGNOSIS — M25511 Pain in right shoulder: Secondary | ICD-10-CM

## 2016-10-04 DIAGNOSIS — M6281 Muscle weakness (generalized): Secondary | ICD-10-CM | POA: Diagnosis not present

## 2016-10-04 NOTE — Therapy (Signed)
[place], [place], [address] Phone: [phone]   Fax:  [phone]  Physical Therapy Treatment  Patient Details  Name: Jim Hunter MRN: [id number] Date of Birth: 15-Oct-1965 Referring Provider: Dr. Ena Dawley  Encounter Date: 10/04/2016      PT End of Session - 10/04/16 1016    Visit Number 18   Date for PT Re-Evaluation 10/06/16   PT Start Time 0935   PT Stop Time 1020   PT Time Calculation (min) 45 min      Past Medical History:  Diagnosis Date  . Arthritis   . GERD (gastroesophageal reflux disease)   . Hyperlipidemia   . Hypertension   . 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 Sleep apnea 3 years ago   no borderline no cpap needed per sleep studty done    Past Surgical History:  Procedure Laterality Date  . [place] SURGERY  2006  . ROTATOR CUFF REPAIR  2010   left  . 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   Procedure: RIGHT TOTAL KNEE ARTHROPLASTY;  Surgeon: Paralee Cancel, MD;  Location: [facility];  Service: Orthopedics;  Laterality: Right;  . TYMPANOPLASTY     age 51    There were no vitals filed for this visit.      Subjective Assessment - 10/04/16 0943    Subjective Pt reports no new changes since yesterday's visit.  He requests shortened session; has to be home for infusion for wife.    Currently in Pain? No/denies   Pain Score 0-No pain            [facility] PT Assessment - 10/04/16 0001      Assessment   Medical Diagnosis Rt RTC Repair   Referring Provider Dr. Ena Dawley   Next MD Visit 10/12/16     Observation/Other Assessments   Focus on Therapeutic Outcomes (FOTO)  50% limited (goal of 33% limited)      ROM / Strength   AROM / PROM / Strength Strength     AROM   Right Shoulder Flexion 154 Degrees  standing   Right Shoulder ABduction 142 Degrees  standing, with pain at 90 deg    Right Shoulder External Rotation 80 Degrees  supine, shoulder propped on pool noodle, abdct to ~75.     Strength   Strength Assessment Site Shoulder   Right/Left Shoulder Right   Right Shoulder Flexion 3/5   Right Shoulder Extension 3+/5   Right Shoulder ABduction 3/5   Right Shoulder Internal Rotation 3/5   Right Shoulder External Rotation 3/5                     [facility] Adult PT Treatment/Exercise - 10/04/16 0001      Shoulder Exercises: Supine   Other Supine Exercises hands behind head, elbow pressing down x 5 reps      Shoulder Exercises: Standing   Flexion Strengthening;Left;12 reps  to 90 deg   Shoulder Flexion Weight (lbs) 1   ABduction Strengthening;Right;10 reps  scaption, to 90 deg.    Shoulder ABduction Weight (lbs) 1   Extension Strengthening;Both;20 reps   Theraband Level (Shoulder Extension) Level 2 (Red)   Row Both;20 reps   Theraband Level (Shoulder Row) Level 2 (Red)     Shoulder Exercises: ROM/Strengthening   UBE (Upper Arm Bike) L2: forward 2 min, backward 2 min  Rhythmic Stabilization, Supine 2 x 30" at 90 dg flexion, 1 rep @ 45 deg, 1 rep @ 120 deg     Shoulder Exercises: Stretch   Other Shoulder Stretches lat stretch supine 30 sec x 2    Other Shoulder Stretches wall ladder RUE, flexion x 10 reps, scaption x 5 reps.      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[employer]   Electrical Stimulation Parameters  to tolerance   Electrical Stimulation Goals Pain     Vasopneumatic   Number Minutes Vasopneumatic  10 minutes   Vasopnuematic Location  Shoulder   Vasopneumatic Pressure Low   Vasopneumatic Temperature  34 deg                   PT Short Term Goals - 09/27/16 0902      PT SHORT TERM GOAL #1   Title I with initial HEP ( 09/01/16)    Time 4   Period Weeks   Status Achieved     PT SHORT TERM GOAL #2   Title demo Rt shoulder PROM WFL ( 09/01/16)    Time 4    Period Weeks   Status Partially Met     PT SHORT TERM GOAL #3   Title tolerate initial elbow strengthening ( 09/01/16)    Time 4   Period Weeks   Status Achieved     PT SHORT TERM GOAL #4   Title improve FOTO =/< 55% limited ( 09/01/16)    Time 4   Period Weeks   Status On-going     PT SHORT TERM GOAL #5   Title reduce pain =/> 50% with daily activity   Time 2   Period Weeks   Status Achieved           PT Long Term Goals - 10/03/16 0900      PT LONG TERM GOAL #1   Title I with advanced HEP ( 10/06/16)    Time 9   Period Weeks   Status On-going     PT LONG TERM GOAL #2   Title demo Rt shoulder AROM WFL ( 10/06/16)    Time 9   Period Weeks   Status On-going     PT LONG TERM GOAL #3   Title demo Rt shoulder strength =/> 5-/5 ( 10/06/16)    Time 9   Period Weeks   Status On-going     PT LONG TERM GOAL #4   Title tolerate lifting with Rt UE per MD orders without increased pain ( 10/06/16)    Time 9   Period Weeks   Status On-going     PT LONG TERM GOAL #5   Title improve FOTO =/< 33% limited ( 10/06/16)    Time 9   Period Weeks   Status On-going     PT LONG TERM GOAL #6   Title report no more than 2/10 pain at the end of the day in the Rt shoulder ( 10/06/16)    Time 9   Period Weeks   Status Achieved               Plan - 10/04/16 1133    Clinical Impression Statement Pt demonstrating improved Rt shoulder ROM.  Strengthening exercises for Rt shoulder initiated after 10 wks, per protocol. Pt tolerated all exercises with minimal increase in pain.  Progressing well towards established goals. Will benefit from continued PT intervention to maximize functional mobility  and safety.    Rehab Potential Excellent   PT Frequency 2x / week   PT Treatment/Interventions Moist Heat;Ultrasound;Therapeutic exercise;Dry needling;Taping;Vasopneumatic Device;Manual techniques;Neuromuscular re-education;Cryotherapy;Electrical Stimulation;Iontophoresis 78m/ml  Dexamethasone;Patient/family education;Passive range of motion   PT Next Visit Plan Spoke to supervising PT; will send recert to MD.  Continue progressive Rt shoulder ROM, strengthening; modalities and manual therapy as indicated.    Consulted and Agree with Plan of Care Patient      Patient will benefit from skilled therapeutic intervention in order to improve the following deficits and impairments:  Postural dysfunction, Decreased strength, Pain, Increased muscle spasms, Impaired UE functional use, Decreased range of motion  Visit Diagnosis: Acute pain of right shoulder  Stiffness of right shoulder, not elsewhere classified  Muscle weakness (generalized)     Problem List Patient Active Problem List   Diagnosis Date Noted  . Healthcare maintenance 11/03/2015  . OSA (obstructive sleep apnea) 11/03/2015  . Obese 09/30/2014  . S/P right TKA 09/29/2014  . S/P knee replacement 09/29/2014  . Alcohol use 07/06/2014  . Osteoarthritis of right knee 05/26/2014  . Erectile dysfunction 05/26/2014  . GERD (gastroesophageal reflux disease) 10/02/2012  . Umbilical hernia 068/38/7065 . Hypertension 07/29/2012  . Hyperlipidemia 07/29/2012  . Obesity (BMI 30-39.9) 07/29/2012   JKerin Perna PTA 10/04/16 11:42 AM  [place] [address]Phone: [phone]  Fax:  [phone] Name: RJeniel SlausonMRN: [id number] of Birth: [birth year]

## 2016-10-04 NOTE — Therapy (Signed)
[place], [place], [address] Phone: [phone]   Fax:  [phone]  Physical Therapy Treatment  Patient Details  Name: Jim Hunter MRN: [id number] Date of Birth: 08/21/1965 Referring Provider: Dr. Ena Dawley  Encounter Date: 10/04/2016      PT End of Session - 10/04/16 1016    Visit Number 18   Date for PT Re-Evaluation 11/29/16   PT Start Time 0935   PT Stop Time 1020   PT Time Calculation (min) 45 min      Past Medical History:  Diagnosis Date  . Arthritis   . GERD (gastroesophageal reflux disease)   . Hyperlipidemia   . Hypertension   . 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 Sleep apnea 3 years ago   no borderline no cpap needed per sleep studty done    Past Surgical History:  Procedure Laterality Date  . [place] SURGERY  2006  . ROTATOR CUFF REPAIR  2010   left  . 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   Procedure: RIGHT TOTAL KNEE ARTHROPLASTY;  Surgeon: Paralee Cancel, MD;  Location: [facility];  Service: Orthopedics;  Laterality: Right;  . TYMPANOPLASTY     age 51    There were no vitals filed for this visit.      Subjective Assessment - 10/04/16 0943    Subjective Pt reports no new changes since yesterday's visit.  He requests shortened session; has to be home for infusion for wife.    Currently in Pain? No/denies   Pain Score 0-No pain            [facility] PT Assessment - 10/04/16 0001      Assessment   Medical Diagnosis Rt RTC Repair   Referring Provider Dr. Ena Dawley   Next MD Visit 10/12/16     Observation/Other Assessments   Focus on Therapeutic Outcomes (FOTO)  50% limited (goal of 33% limited)      ROM / Strength   AROM / PROM / Strength Strength     AROM   Right Shoulder Flexion 154 Degrees  standing   Right Shoulder ABduction 142 Degrees  standing, with pain at 90 deg    Right Shoulder External Rotation 80 Degrees  supine, shoulder propped on pool noodle, abdct to ~75.     Strength   Strength Assessment Site Shoulder   Right/Left Shoulder Right   Right Shoulder Flexion 3/5   Right Shoulder Extension 3+/5   Right Shoulder ABduction 3/5   Right Shoulder Internal Rotation 3/5   Right Shoulder External Rotation 3/5                     [facility] Adult PT Treatment/Exercise - 10/04/16 0001      Shoulder Exercises: Supine   Other Supine Exercises hands behind head, elbow pressing down x 5 reps      Shoulder Exercises: Standing   Flexion Strengthening;Left;12 reps  to 90 deg   Shoulder Flexion Weight (lbs) 1   ABduction Strengthening;Right;10 reps  scaption, to 90 deg.    Shoulder ABduction Weight (lbs) 1   Extension Strengthening;Both;20 reps   Theraband Level (Shoulder Extension) Level 2 (Red)   Row Both;20 reps   Theraband Level (Shoulder Row) Level 2 (Red)     Shoulder Exercises: ROM/Strengthening   UBE (Upper Arm Bike) L2: forward 2 min, backward 2 min  Rhythmic Stabilization, Supine 2 x 30" at 90 dg flexion, 1 rep @ 45 deg, 1 rep @ 120 deg     Shoulder Exercises: Stretch   Other Shoulder Stretches lat stretch supine 30 sec x 2    Other Shoulder Stretches wall ladder RUE, flexion x 10 reps, scaption x 5 reps.      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[employer]   Electrical Stimulation Parameters  to tolerance   Electrical Stimulation Goals Pain     Vasopneumatic   Number Minutes Vasopneumatic  10 minutes   Vasopnuematic Location  Shoulder   Vasopneumatic Pressure Low   Vasopneumatic Temperature  34 deg                   PT Short Term Goals - 10/04/16 1145      PT SHORT TERM GOAL #2   Title demo Rt shoulder PROM WFL ( 11/15/16)    Time 12   Period Weeks   Status Revised     PT SHORT TERM GOAL #4   Title improve FOTO =/< 55% limited ( 11/15/16)     Time 12   Status Revised           PT Long Term Goals - 10/04/16 1146      PT LONG TERM GOAL #1   Title I with advanced HEP ( 11/29/16)    Time 17  added 8 wk to POC 10/04/16   Period Weeks   Status Revised     PT LONG TERM GOAL #2   Title demo Rt shoulder AROM WFL ( 11/29/16)    Time 17   Period Weeks   Status Revised     PT LONG TERM GOAL #3   Title demo Rt shoulder strength =/> 5-/5 ( 11/29/16)    Time 17   Status Revised     PT LONG TERM GOAL #4   Title tolerate lifting with Rt UE per MD orders without increased pain ( 11/29/16)    Time 17   Period Weeks   Status Revised     PT LONG TERM GOAL #5   Title improve FOTO =/< 33% limited ( 11/29/16)    Time 17   Period Weeks   Status Revised               Plan - 10/04/16 1133    Clinical Impression Statement Pt demonstrating improved Rt shoulder ROM.  Strengthening exercises for Rt shoulder initiated after 10 wks, per protocol. Pt tolerated all exercises with minimal increase in pain.  Progressing well towards established goals. Will benefit from continued PT intervention to maximize functional mobility and safety.    Rehab Potential Excellent   PT Frequency 2x / week   PT Treatment/Interventions Moist Heat;Ultrasound;Therapeutic exercise;Dry needling;Taping;Vasopneumatic Device;Manual techniques;Neuromuscular re-education;Cryotherapy;Electrical Stimulation;Iontophoresis 4mg /ml Dexamethasone;Patient/family education;Passive range of motion   PT Next Visit Plan Spoke to supervising PT; will send recert to MD.  Continue progressive Rt shoulder ROM, strengthening; modalities and manual therapy as indicated.    Consulted and Agree with Plan of Care Patient      Patient will benefit from skilled therapeutic intervention in order to improve the following deficits and impairments:  Postural dysfunction, Decreased strength, Pain, Increased muscle spasms, Impaired UE functional use, Decreased range of motion  Visit  Diagnosis: Acute pain of right shoulder - Plan: PT plan of care cert/re-cert  Stiffness of right shoulder, not elsewhere classified - Plan: PT plan  of care cert/re-cert  Muscle weakness (generalized) - Plan: PT plan of care cert/re-cert     Problem List Patient Active Problem List   Diagnosis Date Noted  . Healthcare maintenance 11/03/2015  . OSA (obstructive sleep apnea) 11/03/2015  . Obese 09/30/2014  . S/P right TKA 09/29/2014  . S/P knee replacement 09/29/2014  . Alcohol use 07/06/2014  . Osteoarthritis of right knee 05/26/2014  . Erectile dysfunction 05/26/2014  . GERD (gastroesophageal reflux disease) 10/02/2012  . Umbilical hernia 16/04/9603  . Hypertension 07/29/2012  . Hyperlipidemia 07/29/2012  . Obesity (BMI 30-39.9) 07/29/2012    Mervyn Pflaum P Duane Earnshaw 10/04/2016, 11:50 AM  [facility] [place], [place], [address] Phone: [phone]   Fax:  [phone]  Name: Louay Myrie MRN: [id number] Date of Birth: February 13, 1966

## 2016-10-05 ENCOUNTER — Encounter: Payer: Self-pay | Admitting: Physical Therapy

## 2016-10-10 ENCOUNTER — Ambulatory Visit (INDEPENDENT_AMBULATORY_CARE_PROVIDER_SITE_OTHER): Payer: PRIVATE HEALTH INSURANCE | Admitting: Physical Therapy

## 2016-10-10 DIAGNOSIS — M25511 Pain in right shoulder: Secondary | ICD-10-CM

## 2016-10-10 DIAGNOSIS — M6281 Muscle weakness (generalized): Secondary | ICD-10-CM | POA: Diagnosis not present

## 2016-10-10 DIAGNOSIS — M25611 Stiffness of right shoulder, not elsewhere classified: Secondary | ICD-10-CM | POA: Diagnosis not present

## 2016-10-10 NOTE — Therapy (Signed)
[place], [place], [address] Phone: [phone]   Fax:  [phone]  Physical Therapy Treatment  Patient Details  Name: Jim Hunter MRN: [id number] Date of Birth: [birth year] Referring Provider: Dr. Charlotte Sanes  Encounter Date: 10/10/2016      PT End of Session - 10/10/16 0853    Visit Number 19   Number of Visits 34   Date for PT Re-Evaluation 11/29/16   PT Start Time 0848   PT Stop Time 0943   PT Time Calculation (min) 55 min   Activity Tolerance Patient tolerated treatment well   Behavior During Therapy [facility] for tasks assessed/performed      Past Medical History:  Diagnosis Date  . Arthritis   . GERD (gastroesophageal reflux disease)   . Hyperlipidemia   . Hypertension   . 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 Sleep apnea 3 years ago   no borderline no cpap needed per sleep studty done    Past Surgical History:  Procedure Laterality Date  . [place] SURGERY  2006  . ROTATOR CUFF REPAIR  2010   left  . 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   Procedure: RIGHT TOTAL KNEE ARTHROPLASTY;  Surgeon: Paralee Cancel, MD;  Location: [facility];  Service: Orthopedics;  Laterality: Right;  . TYMPANOPLASTY     age 73    There were no vitals filed for this visit.      Subjective Assessment - 10/10/16 0854    Subjective Pt reports his Rt shoulder remains "sore" from his sleep position and the weather. HEP at home has been limited due to caring for his wife with cancer treatments. He has kept active with his arm; not lifting more than 5#.    Currently in Pain? Yes   Pain Score 2    Pain Location Shoulder   Pain Orientation Right   Pain Descriptors / Indicators Sore   Aggravating Factors  sudden movement, sleeping position   Pain Relieving Factors ice, estim.             [facility] PT Assessment - 10/10/16 0001       Assessment   Medical Diagnosis Rt RTC Repair   Referring Provider Dr. Charlotte Sanes   Next MD Visit 10/12/16     AROM   Right Shoulder Flexion 154 Degrees  standing   Right Shoulder ABduction 154 Degrees  standing   Right Shoulder Internal Rotation 60 Degrees  supine, abdct ~50 deg   Right Shoulder External Rotation 75 Degrees  supine, abdct ~70 deg     Strength   Strength Assessment Site Shoulder   Right/Left Shoulder Right   Right Shoulder Flexion 3+/5   Right Shoulder Extension 4+/5   Right Shoulder ABduction 3+/5   Right Shoulder Internal Rotation 3+/5   Right Shoulder External Rotation 3+/5                     [facility] Adult PT Treatment/Exercise - 10/10/16 0001      Shoulder Exercises: Standing   External Rotation Strengthening;Right;10 reps;Theraband  2 sets    Theraband Level (Shoulder External Rotation) Level 2 (Red)   Flexion Strengthening;Left;12 reps  to 90 deg   Shoulder Flexion Weight (lbs) 2   ABduction Strengthening;Right;12 reps;Weights  scaption to 90 deg   Shoulder ABduction Weight (lbs) 2   Extension Strengthening;Both;Theraband;15 reps   Theraband  Level (Shoulder Extension) Level 3 (Green)   Row Both;Theraband;15 reps   Theraband Level (Shoulder Row) Level 3 (Green)     Shoulder Exercises: ROM/Strengthening   UBE (Upper Arm Bike) L2: forward 2 min, backward 2 min   Wall Pushups 10 reps   Other ROM/Strengthening Exercises Wall ladder, scaption x 8 reps, flexion RUE x 5 reps    Other ROM/Strengthening Exercises body blade (1.5#) RUE flexion @ 45 deg x      Shoulder Exercises: Stretch   Internal Rotation Stretch 20 seconds     Electrical Stimulation   Electrical Stimulation Location Rt shoulder   Electrical Stimulation Action IFC   Electrical Stimulation Parameters to tolerance    Electrical Stimulation Goals Pain     Vasopneumatic   Number Minutes Vasopneumatic  15 minutes   Vasopnuematic Location  Shoulder   Vasopneumatic  Pressure Low   Vasopneumatic Temperature  34 deg                   PT Short Term Goals - 10/10/16 0932      PT SHORT TERM GOAL #1   Title I with initial HEP ( 09/01/16)    Time 4   Period Weeks   Status Achieved     PT SHORT TERM GOAL #2   Title demo Rt shoulder PROM WFL ( 11/15/16)    Time 12   Period Weeks   Status Achieved     PT SHORT TERM GOAL #3   Title tolerate initial elbow strengthening ( 09/01/16)    Time 4   Period Weeks   Status Achieved     PT SHORT TERM GOAL #4   Title improve FOTO =/< 55% limited ( 11/15/16)    Time 12   Period Weeks   Status Achieved     PT SHORT TERM GOAL #5   Title reduce pain =/> 50% with daily activity   Time 2   Period Weeks   Status Achieved           PT Long Term Goals - 10/10/16 0930      PT LONG TERM GOAL #1   Title I with advanced HEP ( 11/29/16)    Time 17   Period Weeks   Status On-going     PT LONG TERM GOAL #2   Title demo Rt shoulder AROM WFL ( 11/29/16)    Time 17   Period Weeks   Status Achieved     PT LONG TERM GOAL #3   Title demo Rt shoulder strength =/> 5-/5 ( 11/29/16)    Time 17   Period Weeks   Status On-going     PT LONG TERM GOAL #4   Title tolerate lifting with Rt UE per MD orders without increased pain ( 11/29/16)    Time 17   Period Weeks   Status On-going     PT LONG TERM GOAL #5   Title improve FOTO =/< 33% limited ( 11/29/16)    Time 17   Period Weeks   Status On-going  scored 50% on 10/04/16     PT LONG TERM GOAL #6   Title report no more than 2/10 pain at the end of the day in the Rt shoulder ( 10/06/16)    Time 9   Period Weeks   Status Achieved               Plan - 10/10/16 0933    Clinical Impression Statement Pt demonstrating improved Rt  shoulder ROM each visit; has met STG and LTG for ROM.  Rt shoulder strength gradually improving with less pain; see strength assessment.  Pt able to tolerate increased resistance with shoulder exercises below 90 deg  flex/abdct.  Pt making good gains towards remaining goals.    Rehab Potential Excellent   PT Frequency 2x / week   PT Duration --  17 wks    PT Treatment/Interventions Moist Heat;Ultrasound;Therapeutic exercise;Dry needling;Taping;Vasopneumatic Device;Manual techniques;Neuromuscular re-education;Cryotherapy;Electrical Stimulation;Iontophoresis 62m/ml Dexamethasone;Patient/family education;Passive range of motion   PT Next Visit Plan Continue progressive Rt shoulder ROM, strengthening; modalities and manual therapy as indicated.    Consulted and Agree with Plan of Care Patient      Patient will benefit from skilled therapeutic intervention in order to improve the following deficits and impairments:  Postural dysfunction, Decreased strength, Pain, Increased muscle spasms, Impaired UE functional use, Decreased range of motion  Visit Diagnosis: Acute pain of right shoulder  Stiffness of right shoulder, not elsewhere classified  Muscle weakness (generalized)     Problem List Patient Active Problem List   Diagnosis Date Noted  . Healthcare maintenance 11/03/2015  . OSA (obstructive sleep apnea) 11/03/2015  . Obese 09/30/2014  . S/P right TKA 09/29/2014  . S/P knee replacement 09/29/2014  . Alcohol use 07/06/2014  . Osteoarthritis of right knee 05/26/2014  . Erectile dysfunction 05/26/2014  . GERD (gastroesophageal reflux disease) 10/02/2012  . Umbilical hernia 038/04/1750 . Hypertension 07/29/2012  . Hyperlipidemia 07/29/2012  . Obesity (BMI 30-39.9) 07/29/2012   JKerin Perna PTA 10/10/16 9:42 AM  [place] [address]Phone: [phone]  Fax:  [phone] Name: Jim SippelMRN: [id number] of Birth: 8January 06, 1967

## 2016-10-12 ENCOUNTER — Encounter: Payer: Self-pay | Admitting: Physical Therapy

## 2016-10-18 ENCOUNTER — Ambulatory Visit (INDEPENDENT_AMBULATORY_CARE_PROVIDER_SITE_OTHER): Payer: PRIVATE HEALTH INSURANCE | Admitting: Physical Therapy

## 2016-10-18 DIAGNOSIS — M6281 Muscle weakness (generalized): Secondary | ICD-10-CM

## 2016-10-18 DIAGNOSIS — M25511 Pain in right shoulder: Secondary | ICD-10-CM | POA: Diagnosis not present

## 2016-10-18 DIAGNOSIS — M25611 Stiffness of right shoulder, not elsewhere classified: Secondary | ICD-10-CM | POA: Diagnosis not present

## 2016-10-18 NOTE — Therapy (Signed)
[place], [place], [address] Phone: [phone]   Fax:  [phone]  Physical Therapy Treatment  Patient Details  Name: Jim Hunter MRN: [id number] Date of Birth: 1965-09-16 Referring Provider: Dr. Charlotte Sanes  Encounter Date: 10/18/2016      PT End of Session - 10/18/16 0820    Visit Number 20   Number of Visits 34   Date for PT Re-Evaluation 11/29/16   PT Start Time 0816  pt arrived late   PT Stop Time 0901   PT Time Calculation (min) 45 min   Activity Tolerance Patient tolerated treatment well   Behavior During Therapy [facility] for tasks assessed/performed      Past Medical History:  Diagnosis Date  . Arthritis   . GERD (gastroesophageal reflux disease)   . Hyperlipidemia   . Hypertension   . 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 Sleep apnea 3 years ago   no borderline no cpap needed per sleep studty done    Past Surgical History:  Procedure Laterality Date  . [place] SURGERY  2006  . ROTATOR CUFF REPAIR  2010   left  . 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   Procedure: RIGHT TOTAL KNEE ARTHROPLASTY;  Surgeon: Paralee Cancel, MD;  Location: [facility];  Service: Orthopedics;  Laterality: Right;  . TYMPANOPLASTY     age 51    There were no vitals filed for this visit.      Subjective Assessment - 10/18/16 0829    Subjective Pt complains of a cramp in Lt shoulder blade area.  He visited MD; lifting restrictions remain in place.  Sleeping is getting better.    Currently in Pain? No/denies   Pain Score 0-No pain            [facility] PT Assessment - 10/18/16 0001      Assessment   Medical Diagnosis Rt RTC Repair   Referring Provider Dr. Charlotte Sanes   Next MD Visit 10/2016     Precautions   Precautions Other (comment)   Precaution Comments no lifting over 5#, no overhead lifting           [facility]  Adult PT Treatment/Exercise - 10/18/16 0001      Shoulder Exercises: Prone   Flexion Right;10 reps   Horizontal ABduction 1 Right;10 reps  palm down     Shoulder Exercises: Standing   External Rotation Strengthening;Right;10 reps;Theraband  2 sets    Theraband Level (Shoulder External Rotation) Level 2 (Red)   Extension Strengthening;Both;Theraband;15 reps   Theraband Level (Shoulder Extension) Level 3 (Green)   Row Both;Theraband;15 reps   Theraband Level (Shoulder Row) Level 3 (Green)   Other Standing Exercises reverse wall push ups x 10 reps      Shoulder Exercises: ROM/Strengthening   UBE (Upper Arm Bike) L2: forward 2 min, backward 2 min     Shoulder Exercises: Stretch   Other Shoulder Stretches 3 way doorway stretch, to tolerance x 3 reps each position.    Other Shoulder Stretches wall ladder RUE, flexion x 5 reps, scaption x 5 reps.      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IFC   Electrical Stimulation Parameters to tolerance    Electrical Stimulation Goals Pain     Vasopneumatic   Number Minutes Vasopneumatic  15 minutes   Vasopnuematic Location  Shoulder   Vasopneumatic Pressure Low   Vasopneumatic Temperature  34 deg                   PT Short Term Goals - 10/10/16 0932      PT SHORT TERM GOAL #1   Title I with initial HEP ( 09/01/16)    Time 4   Period Weeks   Status Achieved     PT SHORT TERM GOAL #2   Title demo Rt shoulder PROM WFL ( 11/15/16)    Time 12   Period Weeks   Status Achieved     PT SHORT TERM GOAL #3   Title tolerate initial elbow strengthening ( 09/01/16)    Time 4   Period Weeks   Status Achieved     PT SHORT TERM GOAL #4   Title improve FOTO =/< 55% limited ( 11/15/16)    Time 12   Period Weeks   Status Achieved     PT SHORT TERM GOAL #5   Title reduce pain =/> 50% with daily activity   Time 2   Period Weeks   Status Achieved           PT Long  Term Goals - 10/10/16 0930      PT LONG TERM GOAL #1   Title I with advanced HEP ( 11/29/16)    Time 17   Period Weeks   Status On-going     PT LONG TERM GOAL #2   Title demo Rt shoulder AROM WFL ( 11/29/16)    Time 17   Period Weeks   Status Achieved     PT LONG TERM GOAL #3   Title demo Rt shoulder strength =/> 5-/5 ( 11/29/16)    Time 17   Period Weeks   Status On-going     PT LONG TERM GOAL #4   Title tolerate lifting with Rt UE per MD orders without increased pain ( 11/29/16)    Time 17   Period Weeks   Status On-going     PT LONG TERM GOAL #5   Title improve FOTO =/< 33% limited ( 11/29/16)    Time 17   Period Weeks   Status On-going  scored 50% on 10/04/16     PT LONG TERM GOAL #6   Title report no more than 2/10 pain at the end of the day in the Rt shoulder ( 10/06/16)    Time 9   Period Weeks   Status Achieved               Plan - 10/18/16 0846    Clinical Impression Statement Pt tolerated new exercises / existing exercises with mild increase in pain up to 4/10.  Pain reduced with use of estim/MHP at end of session.  Making gradual gains towards remaining goals. Will benefit from continued intervention to maximize functional mobility.    Rehab Potential Excellent   PT Frequency 2x / week   PT Treatment/Interventions Moist Heat;Ultrasound;Therapeutic exercise;Dry needling;Taping;Vasopneumatic Device;Manual techniques;Neuromuscular re-education;Cryotherapy;Electrical Stimulation;Iontophoresis 4mg /ml Dexamethasone;Patient/family education;Passive range of motion   PT Next Visit Plan Continue progressive Rt shoulder ROM, strengthening; modalities and manual therapy as indicated.    Consulted and Agree with Plan of Care Patient      Patient will benefit from skilled therapeutic intervention in order to improve the following deficits and impairments:  Postural dysfunction, Decreased strength, Pain, Increased muscle spasms, Impaired UE functional use, Decreased  range of motion  Visit Diagnosis: Acute pain of  right shoulder  Stiffness of right shoulder, not elsewhere classified  Muscle weakness (generalized)     Problem List Patient Active Problem List   Diagnosis Date Noted  . Healthcare maintenance 11/03/2015  . OSA (obstructive sleep apnea) 11/03/2015  . Obese 09/30/2014  . S/P right TKA 09/29/2014  . S/P knee replacement 09/29/2014  . Alcohol use 07/06/2014  . Osteoarthritis of right knee 05/26/2014  . Erectile dysfunction 05/26/2014  . GERD (gastroesophageal reflux disease) 10/02/2012  . Umbilical hernia 63/07/6008  . Hypertension 07/29/2012  . Hyperlipidemia 07/29/2012  . Obesity (BMI 30-39.9) 07/29/2012   Kerin Perna, PTA 10/18/16 8:50 AM  [facility] [place], [place], [address] Phone: [phone]   Fax:  [phone]  Name: Niraj Kudrna MRN: [id number] Date of Birth: [birth year]

## 2016-10-19 ENCOUNTER — Ambulatory Visit (INDEPENDENT_AMBULATORY_CARE_PROVIDER_SITE_OTHER): Payer: PRIVATE HEALTH INSURANCE | Admitting: Physical Therapy

## 2016-10-19 DIAGNOSIS — M6281 Muscle weakness (generalized): Secondary | ICD-10-CM | POA: Diagnosis not present

## 2016-10-19 DIAGNOSIS — M25511 Pain in right shoulder: Secondary | ICD-10-CM

## 2016-10-19 DIAGNOSIS — M25611 Stiffness of right shoulder, not elsewhere classified: Secondary | ICD-10-CM

## 2016-10-19 NOTE — Therapy (Signed)
[place], [place], [address] Phone: [phone]   Fax:  [phone]  Physical Therapy Treatment  Patient Details  Name: Jim Hunter MRN: [id number] Date of Birth: 17-Jun-1966 Referring Provider: Dr. Charlotte Sanes  Encounter Date: 10/19/2016      PT End of Session - 10/19/16 0808    Visit Number 21   Number of Visits 34   Date for PT Re-Evaluation 11/29/16   PT Start Time 0803   PT Stop Time 4315   PT Time Calculation (min) 54 min   Activity Tolerance Patient tolerated treatment well   Behavior During Therapy [facility] for tasks assessed/performed      Past Medical History:  Diagnosis Date  . Arthritis   . GERD (gastroesophageal reflux disease)   . Hyperlipidemia   . Hypertension   . 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 Sleep apnea 3 years ago   no borderline no cpap needed per sleep studty done    Past Surgical History:  Procedure Laterality Date  . [place] SURGERY  2006  . ROTATOR CUFF REPAIR  2010   left  . 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   Procedure: RIGHT TOTAL KNEE ARTHROPLASTY;  Surgeon: Paralee Cancel, MD;  Location: [facility];  Service: Orthopedics;  Laterality: Right;  . TYMPANOPLASTY     age 51    There were no vitals filed for this visit.      Subjective Assessment - 10/19/16 0808    Subjective Pt reports that he had a throbbing pain in the front of his Rt shoulder in the evening.  It resolved with rest. Otherwise no new changes.    Currently in Pain? No/denies   Pain Score 0-No pain            [facility] PT Assessment - 10/19/16 0001      Assessment   Medical Diagnosis Rt RTC Repair   Referring Provider Dr. Charlotte Sanes   Next MD Visit 10/2016     Precautions   Precaution Comments no lifting over 5#, no overhead lifting           [facility] Adult PT Treatment/Exercise - 10/19/16 0001       Shoulder Exercises: Standing   External Rotation Strengthening;Right;10 reps;Theraband  2 sets    Theraband Level (Shoulder External Rotation) Level 2 (Red)   Extension Strengthening;Both;20 reps;Theraband   Theraband Level (Shoulder Extension) Level 2 (Red)   Extension Limitations some discomfort in Lt rhomboid.    Other Standing Exercises reverse wall push ups x 15 reps      Shoulder Exercises: Pulleys   Flexion 3 minutes   ABduction 3 minutes     Shoulder Exercises: Therapy Ball   ABduction Limitations circles at 90 deg CW/CCW x 15 reps, , 2 sets     Shoulder Exercises: ROM/Strengthening   UBE (Upper Arm Bike) --   Wall Pushups 15 reps   Other ROM/Strengthening Exercises 3# onto shoulder level shelf with RUE x 10 reps flexion, 2 sets.      Shoulder Exercises: Stretch   Internal Rotation Stretch --  strap; 10 reps of 10 sec   Other Shoulder Stretches 3 way doorway stretch, to tolerance x 3 reps each position.      Acupuncturist Location Rt shoulder, Lt rhomboid   Electrical Stimulation Action premod to each area.  Electrical Stimulation Parameters to tolerance   Electrical Stimulation Goals Pain     Vasopneumatic   Number Minutes Vasopneumatic  15 minutes   Vasopnuematic Location  Shoulder Rt    Vasopneumatic Pressure Low   Vasopneumatic Temperature  34 deg                   PT Short Term Goals - 10/10/16 0932      PT SHORT TERM GOAL #1   Title I with initial HEP ( 09/01/16)    Time 4   Period Weeks   Status Achieved     PT SHORT TERM GOAL #2   Title demo Rt shoulder PROM WFL ( 11/15/16)    Time 12   Period Weeks   Status Achieved     PT SHORT TERM GOAL #3   Title tolerate initial elbow strengthening ( 09/01/16)    Time 4   Period Weeks   Status Achieved     PT SHORT TERM GOAL #4   Title improve FOTO =/< 55% limited ( 11/15/16)    Time 12   Period Weeks   Status Achieved     PT SHORT TERM GOAL #5    Title reduce pain =/> 50% with daily activity   Time 2   Period Weeks   Status Achieved           PT Long Term Goals - 10/10/16 0930      PT LONG TERM GOAL #1   Title I with advanced HEP ( 11/29/16)    Time 17   Period Weeks   Status On-going     PT LONG TERM GOAL #2   Title demo Rt shoulder AROM WFL ( 11/29/16)    Time 17   Period Weeks   Status Achieved     PT LONG TERM GOAL #3   Title demo Rt shoulder strength =/> 5-/5 ( 11/29/16)    Time 17   Period Weeks   Status On-going     PT LONG TERM GOAL #4   Title tolerate lifting with Rt UE per MD orders without increased pain ( 11/29/16)    Time 17   Period Weeks   Status On-going     PT LONG TERM GOAL #5   Title improve FOTO =/< 33% limited ( 11/29/16)    Time 17   Period Weeks   Status On-going  scored 50% on 10/04/16     PT LONG TERM GOAL #6   Title report no more than 2/10 pain at the end of the day in the Rt shoulder ( 10/06/16)    Time 9   Period Weeks   Status Achieved               Plan - 10/19/16 0955    Clinical Impression Statement Pt tolerating new /existing exercises with only mild increase in pain as well as fatigue.  Spasm in Lt posterior shoulder girdle persisted; improved wiht use of estim at end. Pt able to lift 3# to shoulder height with minimal diffulty.  Progressing towards goals. Will benefit from conitnued PT intervention to max functional independence.    Rehab Potential Excellent   PT Frequency 2x / week   PT Duration --  17 wks   PT Treatment/Interventions Moist Heat;Ultrasound;Therapeutic exercise;Dry needling;Taping;Vasopneumatic Device;Manual techniques;Neuromuscular re-education;Cryotherapy;Electrical Stimulation;Iontophoresis 4mg /ml Dexamethasone;Patient/family education;Passive range of motion   PT Next Visit Plan Continue progressive Rt shoulder ROM, strengthening; modalities and manual therapy as indicated.    Consulted  and Agree with Plan of Care Patient      Patient will  benefit from skilled therapeutic intervention in order to improve the following deficits and impairments:  Postural dysfunction, Decreased strength, Pain, Increased muscle spasms, Impaired UE functional use, Decreased range of motion  Visit Diagnosis: Acute pain of right shoulder  Stiffness of right shoulder, not elsewhere classified  Muscle weakness (generalized)     Problem List Patient Active Problem List   Diagnosis Date Noted  . Healthcare maintenance 11/03/2015  . OSA (obstructive sleep apnea) 11/03/2015  . Obese 09/30/2014  . S/P right TKA 09/29/2014  . S/P knee replacement 09/29/2014  . Alcohol use 07/06/2014  . Osteoarthritis of right knee 05/26/2014  . Erectile dysfunction 05/26/2014  . GERD (gastroesophageal reflux disease) 10/02/2012  . Umbilical hernia 55/97/4163  . Hypertension 07/29/2012  . Hyperlipidemia 07/29/2012  . Obesity (BMI 30-39.9) 07/29/2012   Kerin Perna, PTA 10/19/16 10:01 AM  [place], [place], [address] Phone: [phone]   Fax:  [phone]  Name: Brittany Amirault MRN: [id number] Date of Birth: 11-May-1966

## 2016-10-24 ENCOUNTER — Ambulatory Visit (INDEPENDENT_AMBULATORY_CARE_PROVIDER_SITE_OTHER): Payer: PRIVATE HEALTH INSURANCE | Admitting: Physical Therapy

## 2016-10-24 DIAGNOSIS — M6281 Muscle weakness (generalized): Secondary | ICD-10-CM

## 2016-10-24 DIAGNOSIS — M25611 Stiffness of right shoulder, not elsewhere classified: Secondary | ICD-10-CM | POA: Diagnosis not present

## 2016-10-24 DIAGNOSIS — M25511 Pain in right shoulder: Secondary | ICD-10-CM | POA: Diagnosis not present

## 2016-10-24 NOTE — Therapy (Signed)
[place], [place], [address] Phone: [phone]   Fax:  [phone]  Physical Therapy Treatment  Patient Details  Name: Jim Hunter MRN: [id number] Date of Birth: [birth year] Referring Provider: Dr. Charlotte Sanes  Encounter Date: 10/24/2016      PT End of Session - 10/24/16 1007    Visit Number 22   Number of Visits 34   Date for PT Re-Evaluation 11/29/16   PT Start Time 0930   PT Stop Time 1021   PT Time Calculation (min) 51 min   Activity Tolerance Patient tolerated treatment well   Behavior During Therapy [facility] for tasks assessed/performed      Past Medical History:  Diagnosis Date  . Arthritis   . GERD (gastroesophageal reflux disease)   . Hyperlipidemia   . Hypertension   . 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 Sleep apnea 3 years ago   no borderline no cpap needed per sleep studty done    Past Surgical History:  Procedure Laterality Date  . [place] SURGERY  2006  . ROTATOR CUFF REPAIR  2010   left  . 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   Procedure: RIGHT TOTAL KNEE ARTHROPLASTY;  Surgeon: Paralee Cancel, MD;  Location: [facility];  Service: Orthopedics;  Laterality: Right;  . TYMPANOPLASTY     age 51    There were no vitals filed for this visit.      Subjective Assessment - 10/24/16 0931    Subjective no pain today; doing well   Pertinent History multiple orthopedic surgeries.    Patient Stated Goals drive and return to work, sleep through the night ( currently tolerates ~ 4-5 hrs)    Currently in Pain? No/denies                         [facility] Adult PT Treatment/Exercise - 10/24/16 0931      Shoulder Exercises: Prone   Retraction Right;15 reps;Weights  2 sets   Retraction Weight (lbs) 3   Extension Right;15 reps;Weights  2 sets   Extension Weight (lbs) 3   Horizontal ABduction 1  Right;15 reps  2 sets     Shoulder Exercises: Standing   External Rotation Right;12 reps;Theraband  2 sets   Theraband Level (Shoulder External Rotation) Level 2 (Red)   Extension Strengthening;Right;15 reps;Theraband  2 sets   Theraband Level (Shoulder Extension) Level 2 (Red)   Extension Limitations 2 sec hold     Shoulder Exercises: Pulleys   Flexion 3 minutes   ABduction 3 minutes     Shoulder Exercises: ROM/Strengthening   Wall Pushups 10 reps  counter height     Shoulder Exercises: Body Blade   Flexion 30 seconds;2 reps  bil   External Rotation 30 seconds;2 reps  Rt     Electrical Stimulation   Electrical Stimulation Location Rt shoulder   Electrical Stimulation Action IFC   Electrical Stimulation Parameters to tolerance   Electrical Stimulation Goals Pain     Vasopneumatic   Number Minutes Vasopneumatic  15 minutes   Vasopnuematic Location  Shoulder   Vasopneumatic Pressure Low   Vasopneumatic Temperature  34 deg                   PT Short Term Goals - 10/10/16 0932      PT SHORT TERM GOAL #1  Title I with initial HEP ( 09/01/16)    Time 4   Period Weeks   Status Achieved     PT SHORT TERM GOAL #2   Title demo Rt shoulder PROM WFL ( 11/15/16)    Time 12   Period Weeks   Status Achieved     PT SHORT TERM GOAL #3   Title tolerate initial elbow strengthening ( 09/01/16)    Time 4   Period Weeks   Status Achieved     PT SHORT TERM GOAL #4   Title improve FOTO =/< 55% limited ( 11/15/16)    Time 12   Period Weeks   Status Achieved     PT SHORT TERM GOAL #5   Title reduce pain =/> 50% with daily activity   Time 2   Period Weeks   Status Achieved           PT Long Term Goals - 10/10/16 0930      PT LONG TERM GOAL #1   Title I with advanced HEP ( 11/29/16)    Time 17   Period Weeks   Status On-going     PT LONG TERM GOAL #2   Title demo Rt shoulder AROM WFL ( 11/29/16)    Time 17   Period Weeks   Status Achieved     PT LONG  TERM GOAL #3   Title demo Rt shoulder strength =/> 5-/5 ( 11/29/16)    Time 17   Period Weeks   Status On-going     PT LONG TERM GOAL #4   Title tolerate lifting with Rt UE per MD orders without increased pain ( 11/29/16)    Time 17   Period Weeks   Status On-going     PT LONG TERM GOAL #5   Title improve FOTO =/< 33% limited ( 11/29/16)    Time 17   Period Weeks   Status On-going  scored 50% on 10/04/16     PT LONG TERM GOAL #6   Title report no more than 2/10 pain at the end of the day in the Rt shoulder ( 10/06/16)    Time 9   Period Weeks   Status Achieved               Plan - 10/24/16 1007    Clinical Impression Statement Pt tolerated strengthening exercises well with no c/o shoulder pain after session, just muscle fatigue.  Strength improving slowly and progressing towards goals.  Will benefit from skilled PT to maximize function.   PT Treatment/Interventions Moist Heat;Ultrasound;Therapeutic exercise;Dry needling;Taping;Vasopneumatic Device;Manual techniques;Neuromuscular re-education;Cryotherapy;Electrical Stimulation;Iontophoresis 4mg /ml Dexamethasone;Patient/family education;Passive range of motion   PT Next Visit Plan Continue progressive Rt shoulder ROM, strengthening; modalities and manual therapy as indicated.    Consulted and Agree with Plan of Care Patient      Patient will benefit from skilled therapeutic intervention in order to improve the following deficits and impairments:  Postural dysfunction, Decreased strength, Pain, Increased muscle spasms, Impaired UE functional use, Decreased range of motion  Visit Diagnosis: Acute pain of right shoulder  Stiffness of right shoulder, not elsewhere classified  Muscle weakness (generalized)     Problem List Patient Active Problem List   Diagnosis Date Noted  . Healthcare maintenance 11/03/2015  . OSA (obstructive sleep apnea) 11/03/2015  . Obese 09/30/2014  . S/P right TKA 09/29/2014  . S/P knee  replacement 09/29/2014  . Alcohol use 07/06/2014  . Osteoarthritis of right knee 05/26/2014  . Erectile dysfunction  05/26/2014  . GERD (gastroesophageal reflux disease) 10/02/2012  . Umbilical hernia 17/83/7542  . Hypertension 07/29/2012  . Hyperlipidemia 07/29/2012  . Obesity (BMI 30-39.9) 07/29/2012      Laureen Abrahams, PT, DPT 10/24/16 10:09 AM    [facility] [place], [place], [address] Phone: [phone]   Fax:  [phone]  Name: Jim Hunter MRN: [id number] Date of Birth: 23-Jul-1965

## 2016-10-25 ENCOUNTER — Ambulatory Visit (INDEPENDENT_AMBULATORY_CARE_PROVIDER_SITE_OTHER): Payer: PRIVATE HEALTH INSURANCE | Admitting: Physical Therapy

## 2016-10-25 DIAGNOSIS — M25611 Stiffness of right shoulder, not elsewhere classified: Secondary | ICD-10-CM

## 2016-10-25 DIAGNOSIS — M6281 Muscle weakness (generalized): Secondary | ICD-10-CM | POA: Diagnosis not present

## 2016-10-25 DIAGNOSIS — M25511 Pain in right shoulder: Secondary | ICD-10-CM

## 2016-10-25 NOTE — Patient Instructions (Signed)
  Low Row: Single Arm   Face anchor in stride stance. Palm up, pull arm back while squeezing shoulder blades together. Repeat 10__ times per set. Repeat with other arm. Do _2-3_ sets per session. Do 3__ sessions per week. Anchor Height: Waist  Press: Thumb Up (Single Arm)   Face away from anchor in stride stance, leg forward opposite exercising arm. Press arm forward with thumb up. Repeat 10 times per set.  Do _2-3_ sets per session. Do _3_ sessions per week. Anchor Height: Chest  Rotation: External (Single Arm)   Side toward anchor in shoulder width stance with elbow bent to 90, arm across mid-section. Thumb up, pull arm away from body, keeping elbow bent. Repeat _10_ times per set. Repeat with other arm. Do _2-3_ sets per session. Do _3_ sessions per week. Anchor Height: Waist  Rotation: Internal (Single Arm)   Side toward anchor in shoulder width stance with elbow bent to 90, forearm away from body. Thumb up, pull arm across body keeping elbow bent. Repeat _10_ times per set. Repeat with other arm. Do _2-3_ sets per session. Do _3_ sessions per week. Anchor Height: Waist   [facility] [place], [place] [address]  [phone] (office) [phone] (fax)

## 2016-10-25 NOTE — Therapy (Signed)
[place], [place], [address] Phone: [phone]   Fax:  [phone]  Physical Therapy Treatment  Patient Details  Name: Jim Hunter MRN: [id number] Date of Birth: 1965/10/29 Referring Provider: Dr. Charlotte Sanes  Encounter Date: 10/25/2016      PT End of Session - 10/25/16 0851    Visit Number 23   Number of Visits 34   Date for PT Re-Evaluation 11/29/16   PT Start Time 0848   PT Stop Time 0943   PT Time Calculation (min) 55 min   Activity Tolerance Patient tolerated treatment well;No increased pain   Behavior During Therapy WFL for tasks assessed/performed      Past Medical History:  Diagnosis Date  . Arthritis   . GERD (gastroesophageal reflux disease)   . Hyperlipidemia   . Hypertension   . 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 Sleep apnea 3 years ago   no borderline no cpap needed per sleep studty done    Past Surgical History:  Procedure Laterality Date  . [place] SURGERY  2006  . ROTATOR CUFF REPAIR  2010   left  . 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   Procedure: RIGHT TOTAL KNEE ARTHROPLASTY;  Surgeon: Paralee Cancel, MD;  Location: [facility];  Service: Orthopedics;  Laterality: Right;  . TYMPANOPLASTY     age 22    There were no vitals filed for this visit.      Subjective Assessment - 10/25/16 0851    Subjective Pt reports no new changes with his shoulder.  His low back has begun to be more painful lately.     Currently in Pain? Yes   Pain Score 6    Pain Location Back   Pain Orientation Lower   Pain Descriptors / Indicators Spasm;Dull   Aggravating Factors  everything.    Pain Relieving Factors laying down prone            [facility] PT Assessment - 10/25/16 0001      Assessment   Medical Diagnosis Rt RTC Repair   Referring Provider Dr. Charlotte Sanes   Next MD Visit 10/2016     AROM    Right/Left Shoulder --  standing, abd/ ext/ flex   Right Shoulder Extension 65 Degrees   Right Shoulder Flexion 154 Degrees   Right Shoulder ABduction 154 Degrees  pain upon lowering    Right Shoulder External Rotation 82 Degrees  supine     PROM   Right Shoulder Flexion 162 Degrees  supine          [facility] Adult PT Treatment/Exercise - 10/25/16 0001      Shoulder Exercises: Standing   External Rotation Strengthening  rockwood 4, 2 sets   Theraband Level (Shoulder External Rotation) Level 2 (Red)   Internal Rotation Strengthening;Right;10 reps;Theraband  rockwood 4, 2 sets   Theraband Level (Shoulder Internal Rotation) Level 2 (Red)   Flexion Strengthening;Right;10 reps;Theraband   Theraband Level (Shoulder Flexion) Level 2 (Red)  (rockwood 4), 2 sets   Row Strengthening;Right;10 reps;Theraband  2 sets   Theraband Level (Shoulder Row) Level 2 (Red)  rockwood 4   Other Standing Exercises reverse wall push ups x 3 sec hold x 10 reps      Shoulder Exercises: Pulleys   Flexion 3 minutes     Shoulder Exercises: ROM/Strengthening   UBE (Upper Arm Bike) L3: forward 2  min, backward 2 min   Other ROM/Strengthening Exercises 4# onto shoulder level shelf with RUE x 10 reps flexion, then 1 set of 10 with 3#     Shoulder Exercises: Stretch   Other Shoulder Stretches 3 way doorway stretch, to tolerance x 3 reps each position.    Other Shoulder Stretches lat stretch x 30 sec x 2 reps     Modalities   Modalities Moist Heat;Cryotherapy     Moist Heat Therapy   Number Minutes Moist Heat 15 Minutes   Moist Heat Location Lumbar Spine     Cryotherapy   Number Minutes Cryotherapy 15 Minutes   Cryotherapy Location Shoulder  Rt   Type of Cryotherapy Ice pack                PT Education - 10/25/16 0925    Education provided Yes   Education Details HEP - Rockwood 4. Pt already has yellow/red band at home.    Person(s) Educated Patient   Methods  Explanation;Demonstration;Verbal cues;Handout   Comprehension Returned demonstration;Verbalized understanding          PT Short Term Goals - 10/10/16 0932      PT SHORT TERM GOAL #1   Title I with initial HEP ( 09/01/16)    Time 4   Period Weeks   Status Achieved     PT SHORT TERM GOAL #2   Title demo Rt shoulder PROM WFL ( 11/15/16)    Time 12   Period Weeks   Status Achieved     PT SHORT TERM GOAL #3   Title tolerate initial elbow strengthening ( 09/01/16)    Time 4   Period Weeks   Status Achieved     PT SHORT TERM GOAL #4   Title improve FOTO =/< 55% limited ( 11/15/16)    Time 12   Period Weeks   Status Achieved     PT SHORT TERM GOAL #5   Title reduce pain =/> 50% with daily activity   Time 2   Period Weeks   Status Achieved           PT Long Term Goals - 10/10/16 0930      PT LONG TERM GOAL #1   Title I with advanced HEP ( 11/29/16)    Time 17   Period Weeks   Status On-going     PT LONG TERM GOAL #2   Title demo Rt shoulder AROM WFL ( 11/29/16)    Time 17   Period Weeks   Status Achieved     PT LONG TERM GOAL #3   Title demo Rt shoulder strength =/> 5-/5 ( 11/29/16)    Time 17   Period Weeks   Status On-going     PT LONG TERM GOAL #4   Title tolerate lifting with Rt UE per MD orders without increased pain ( 11/29/16)    Time 17   Period Weeks   Status On-going     PT LONG TERM GOAL #5   Title improve FOTO =/< 33% limited ( 11/29/16)    Time 17   Period Weeks   Status On-going  scored 50% on 10/04/16     PT LONG TERM GOAL #6   Title report no more than 2/10 pain at the end of the day in the Rt shoulder ( 10/06/16)    Time 9   Period Weeks   Status Achieved  Plan - 10/25/16 1024    Clinical Impression Statement Pt no longer notices a difference with use of estim/vaso; switched to ice pack at end of session.  New exercises added to HEP for strengthening Rt shoulder.  Pt's Rt shoulder ER ROM improved.  Progressing  towards established goals.    Rehab Potential Excellent   PT Frequency 2x / week   PT Treatment/Interventions Moist Heat;Ultrasound;Therapeutic exercise;Dry needling;Taping;Vasopneumatic Device;Manual techniques;Neuromuscular re-education;Cryotherapy;Electrical Stimulation;Iontophoresis 4mg /ml Dexamethasone;Patient/family education;Passive range of motion   PT Next Visit Plan Continue progressive Rt shoulder ROM, strengthening; modalities and manual therapy as indicated.    Consulted and Agree with Plan of Care Patient      Patient will benefit from skilled therapeutic intervention in order to improve the following deficits and impairments:  Postural dysfunction, Decreased strength, Pain, Increased muscle spasms, Impaired UE functional use, Decreased range of motion  Visit Diagnosis: Acute pain of right shoulder  Stiffness of right shoulder, not elsewhere classified  Muscle weakness (generalized)     Problem List Patient Active Problem List   Diagnosis Date Noted  . Healthcare maintenance 11/03/2015  . OSA (obstructive sleep apnea) 11/03/2015  . Obese 09/30/2014  . S/P right TKA 09/29/2014  . S/P knee replacement 09/29/2014  . Alcohol use 07/06/2014  . Osteoarthritis of right knee 05/26/2014  . Erectile dysfunction 05/26/2014  . GERD (gastroesophageal reflux disease) 10/02/2012  . Umbilical hernia 16/38/4536  . Hypertension 07/29/2012  . Hyperlipidemia 07/29/2012  . Obesity (BMI 30-39.9) 07/29/2012   Kerin Perna, PTA 10/25/16 10:28 AM  [place], [place], [address] Phone: [phone]   Fax:  [phone]  Name: Jim Hunter MRN: [id number] Date of Birth: October 18, 1965

## 2016-10-31 ENCOUNTER — Ambulatory Visit (INDEPENDENT_AMBULATORY_CARE_PROVIDER_SITE_OTHER): Payer: PRIVATE HEALTH INSURANCE | Admitting: Physical Therapy

## 2016-10-31 DIAGNOSIS — M6281 Muscle weakness (generalized): Secondary | ICD-10-CM

## 2016-10-31 DIAGNOSIS — M25611 Stiffness of right shoulder, not elsewhere classified: Secondary | ICD-10-CM | POA: Diagnosis not present

## 2016-10-31 DIAGNOSIS — M25511 Pain in right shoulder: Secondary | ICD-10-CM

## 2016-10-31 NOTE — Therapy (Signed)
[place], [place], [address] Phone: [phone]   Fax:  [phone]  Physical Therapy Treatment  Patient Details  Name: Jim Hunter MRN: [id number] Date of Birth: January 26, 1966 Referring Provider: Dr. Charlotte Sanes  Encounter Date: 10/31/2016      PT End of Session - 10/31/16 0809    Visit Number 24   Number of Visits 34   Date for PT Re-Evaluation 11/29/16   PT Start Time 0805   PT Stop Time 0900   PT Time Calculation (min) 55 min      Past Medical History:  Diagnosis Date  . Arthritis   . GERD (gastroesophageal reflux disease)   . Hyperlipidemia   . Hypertension   . 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 Sleep apnea 3 years ago   no borderline no cpap needed per sleep studty done    Past Surgical History:  Procedure Laterality Date  . [place] SURGERY  2006  . ROTATOR CUFF REPAIR  2010   left  . 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   Procedure: RIGHT TOTAL KNEE ARTHROPLASTY;  Surgeon: Paralee Cancel, MD;  Location: [facility];  Service: Orthopedics;  Laterality: Right;  . TYMPANOPLASTY     age 13    There were no vitals filed for this visit.      Subjective Assessment - 10/31/16 0809    Subjective Pt complains that his Rt hand is shaking more and more when using it.  He is no longer taking pain medication. His sleep pattern is still off; waking frequently.    Currently in Pain? Yes   Pain Score 6    Pain Location Back   Pain Orientation Lower   Pain Descriptors / Indicators Dull   Aggravating Factors  everything   Pain Relieving Factors laying down             [facility] PT Assessment - 10/31/16 0001      Assessment   Medical Diagnosis Rt RTC Repair   Referring Provider Dr. Harrell Gave Tuohy   Onset Date/Surgical Date 06/16/16   Next MD Visit 5/18     Strength   Right Shoulder Flexion 4/5   Right  Shoulder ABduction 3+/5   Right Shoulder Internal Rotation 4+/5   Right Shoulder External Rotation 4/5          [facility] Adult PT Treatment/Exercise - 10/31/16 0001      Shoulder Exercises: Standing   External Rotation Strengthening;Both;10 reps.1 sec hold, then 2nd set of 10 scap squeeze with 3 sec hold in ER, slow release.    ABduction Strengthening;Right;10 reps;Weights  with scap squeeze   Shoulder ABduction Weight (lbs) 2   Row Strengthening;Right;10 reps;Theraband  2 sets   Theraband Level (Shoulder Row) Level 3 (Green)     Shoulder Exercises: Pulleys   Other Pulley Exercises overhead bouncing against wall (orange) x 1 min x 3 reps     Shoulder Exercises: ROM/Strengthening   UBE (Upper Arm Bike) L2: forward 2 min, L4: backward 2 min   Wall Pushups 10 reps  2 sets, countertop     Shoulder Exercises: Stretch   Other Shoulder Stretches 3 way doorway stretch, to tolerance x 3 reps each position.    Other Shoulder Stretches lat stretch x 15 sec x 4 reps     Modalities   Modalities Cryotherapy;Electrical Stimulation;Moist Heat  Moist Heat Therapy   Number Minutes Moist Heat 15 Minutes   Moist Heat Location Lumbar Spine     Cryotherapy   Number Minutes Cryotherapy 15 Minutes   Cryotherapy Location Shoulder  Rt   Type of Cryotherapy Ice pack     Electrical Stimulation   Electrical Stimulation Location Rt shoulder   Electrical Stimulation Action IFC   Electrical Stimulation Parameters to tolerance    Electrical Stimulation Goals Pain                  PT Short Term Goals - 10/10/16 0932      PT SHORT TERM GOAL #1   Title I with initial HEP ( 09/01/16)    Time 4   Period Weeks   Status Achieved     PT SHORT TERM GOAL #2   Title demo Rt shoulder PROM WFL ( 11/15/16)    Time 12   Period Weeks   Status Achieved     PT SHORT TERM GOAL #3   Title tolerate initial elbow strengthening ( 09/01/16)    Time 4   Period Weeks   Status Achieved     PT SHORT  TERM GOAL #4   Title improve FOTO =/< 55% limited ( 11/15/16)    Time 12   Period Weeks   Status Achieved     PT SHORT TERM GOAL #5   Title reduce pain =/> 50% with daily activity   Time 2   Period Weeks   Status Achieved           PT Long Term Goals - 10/10/16 0930      PT LONG TERM GOAL #1   Title I with advanced HEP ( 11/29/16)    Time 17   Period Weeks   Status On-going     PT LONG TERM GOAL #2   Title demo Rt shoulder AROM WFL ( 11/29/16)    Time 17   Period Weeks   Status Achieved     PT LONG TERM GOAL #3   Title demo Rt shoulder strength =/> 5-/5 ( 11/29/16)    Time 17   Period Weeks   Status On-going     PT LONG TERM GOAL #4   Title tolerate lifting with Rt UE per MD orders without increased pain ( 11/29/16)    Time 17   Period Weeks   Status On-going     PT LONG TERM GOAL #5   Title improve FOTO =/< 33% limited ( 11/29/16)    Time 17   Period Weeks   Status On-going  scored 50% on 10/04/16     PT LONG TERM GOAL #6   Title report no more than 2/10 pain at the end of the day in the Rt shoulder ( 10/06/16)    Time 9   Period Weeks   Status Achieved               Plan - 10/31/16 1044    Clinical Impression Statement Improving Rt shoulder strength this visit.  Pt tolerated all exercises with minimal increase in pain, mostly just fatigue.  Occasional VC/tactile cues to correct compensatory shoulder motions.   Progressing towards established goals.    Rehab Potential Excellent   PT Frequency 2x / week   PT Duration --  17 wks   PT Treatment/Interventions Moist Heat;Ultrasound;Therapeutic exercise;Dry needling;Taping;Vasopneumatic Device;Manual techniques;Neuromuscular re-education;Cryotherapy;Electrical Stimulation;Iontophoresis 4mg /ml Dexamethasone;Patient/family education;Passive range of motion   PT Next Visit Plan Continue progressive Rt shoulder ROM,  strengthening; modalities and manual therapy as indicated.    Consulted and Agree with Plan of  Care Patient      Patient will benefit from skilled therapeutic intervention in order to improve the following deficits and impairments:  Postural dysfunction, Decreased strength, Pain, Increased muscle spasms, Impaired UE functional use, Decreased range of motion  Visit Diagnosis: Acute pain of right shoulder  Stiffness of right shoulder, not elsewhere classified  Muscle weakness (generalized)     Problem List Patient Active Problem List   Diagnosis Date Noted  . Healthcare maintenance 11/03/2015  . OSA (obstructive sleep apnea) 11/03/2015  . Obese 09/30/2014  . S/P right TKA 09/29/2014  . S/P knee replacement 09/29/2014  . Alcohol use 07/06/2014  . Osteoarthritis of right knee 05/26/2014  . Erectile dysfunction 05/26/2014  . GERD (gastroesophageal reflux disease) 10/02/2012  . Umbilical hernia 95/97/4718  . Hypertension 07/29/2012  . Hyperlipidemia 07/29/2012  . Obesity (BMI 30-39.9) 07/29/2012   Kerin Perna, PTA 10/31/16 10:55 AM  [place], [place], [address] Phone: [phone]   Fax:  [phone]  Name: Jim Hunter MRN: [id number] Date of Birth: [birth year]

## 2016-11-01 ENCOUNTER — Other Ambulatory Visit: Payer: Self-pay | Admitting: *Deleted

## 2016-11-02 MED ORDER — LISINOPRIL 20 MG PO TABS: 20.0000 mg | ORAL_TABLET | Freq: Every day | ORAL | 0 refills | Status: DC

## 2016-11-03 ENCOUNTER — Ambulatory Visit (INDEPENDENT_AMBULATORY_CARE_PROVIDER_SITE_OTHER): Payer: PRIVATE HEALTH INSURANCE | Admitting: Physical Therapy

## 2016-11-03 DIAGNOSIS — M25611 Stiffness of right shoulder, not elsewhere classified: Secondary | ICD-10-CM

## 2016-11-03 DIAGNOSIS — M6281 Muscle weakness (generalized): Secondary | ICD-10-CM

## 2016-11-03 DIAGNOSIS — M25511 Pain in right shoulder: Secondary | ICD-10-CM | POA: Diagnosis not present

## 2016-11-03 NOTE — Therapy (Signed)
[place], [place], [address] Phone: [phone]   Fax:  [phone]  Physical Therapy Treatment  Patient Details  Name: Jim Hunter MRN: [id number] Date of Birth: Oct 31, 1965 Referring Provider: Dr. Charlotte Sanes  Encounter Date: 11/03/2016      PT End of Session - 11/03/16 0845    Visit Number 25   Number of Visits 34   Date for PT Re-Evaluation 11/29/16   PT Start Time 0804   PT Stop Time 0900   PT Time Calculation (min) 56 min   Activity Tolerance Patient tolerated treatment well   Behavior During Therapy [facility] for tasks assessed/performed      Past Medical History:  Diagnosis Date  . Arthritis   . GERD (gastroesophageal reflux disease)   . Hyperlipidemia   . Hypertension   . 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 Sleep apnea 3 years ago   no borderline no cpap needed per sleep studty done    Past Surgical History:  Procedure Laterality Date  . [place] SURGERY  2006  . ROTATOR CUFF REPAIR  2010   left  . 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   Procedure: RIGHT TOTAL KNEE ARTHROPLASTY;  Surgeon: Paralee Cancel, MD;  Location: [facility];  Service: Orthopedics;  Laterality: Right;  . TYMPANOPLASTY     age 51    There were no vitals filed for this visit.      Subjective Assessment - 11/03/16 0811    Subjective Pt reports no new changes since last visit.    Patient Stated Goals drive and return to work, sleep through the night ( currently tolerates ~ 4-5 hrs)    Currently in Pain? Yes   Pain Score 2    Pain Location Back   Pain Orientation Lower   Pain Descriptors / Indicators Dull   Aggravating Factors  everything   Pain Relieving Factors laying down, heat.             [facility] PT Assessment - 11/03/16 0001      Strength   Strength Assessment Site Hand   Right/Left hand Right;Left   Right Hand Grip  (lbs) 80   Left Hand Grip (lbs) 105         [facility] Adult PT Treatment/Exercise - 11/03/16 0001      Exercises   Exercises Hand;Shoulder     Shoulder Exercises: Standing   Other Standing Exercises scap retraction with mini squat  x 10 reps      Shoulder Exercises: Therapy Ball   Flexion 5 reps  stretching, 15 sec hold    ABduction 5 reps  15 sec hold, overhead stretch   ABduction Limitations circles at 90 deg CW/CCW x  2 sets     Shoulder Exercises: ROM/Strengthening   UBE (Upper Arm Bike) L4: 2 min forward, 2 min backward.    Wall Pushups 10 reps  2 sets, countertop   Rhythmic Stabilization, Seated with Rt hand on ball x 30 sec x 3 reps  Standing, 90 deg, 110 deg     Shoulder Exercises: Stretch   Other Shoulder Stretches 3 way doorway stretch, to tolerance x 3 reps each position.    Other Shoulder Stretches lat stretch x 15 sec x 4 reps     Hand Exercises   Other Hand Exercises Large grip squeezes x 15 reps for Rt hand  Other Hand Exercises Rt wrist flex with 4#, wrist ext 4# -x15 each      Moist Heat Therapy   Number Minutes Moist Heat 15 Minutes   Moist Heat Location Lumbar Spine     Cryotherapy   Number Minutes Cryotherapy 15 Minutes   Cryotherapy Location Shoulder  Rt   Type of Cryotherapy Ice pack     Electrical Stimulation   Electrical Stimulation Location Rt shoulder   Electrical Stimulation Action IFC   Electrical Stimulation Parameters to tolerance                   PT Short Term Goals - 10/10/16 0932      PT SHORT TERM GOAL #1   Title I with initial HEP ( 09/01/16)    Time 4   Period Weeks   Status Achieved     PT SHORT TERM GOAL #2   Title demo Rt shoulder PROM WFL ( 11/15/16)    Time 12   Period Weeks   Status Achieved     PT SHORT TERM GOAL #3   Title tolerate initial elbow strengthening ( 09/01/16)    Time 4   Period Weeks   Status Achieved     PT SHORT TERM GOAL #4   Title improve FOTO =/< 55% limited ( 11/15/16)    Time 12    Period Weeks   Status Achieved     PT SHORT TERM GOAL #5   Title reduce pain =/> 50% with daily activity   Time 2   Period Weeks   Status Achieved           PT Long Term Goals - 11/03/16 2353      PT LONG TERM GOAL #1   Title I with advanced HEP ( 11/29/16)    Time 17   Period Weeks   Status On-going     PT LONG TERM GOAL #2   Title demo Rt shoulder AROM WFL ( 11/29/16)    Time 17   Period Weeks   Status Achieved     PT LONG TERM GOAL #3   Title demo Rt shoulder strength =/> 5-/5 ( 11/29/16)    Time 17   Period Weeks   Status On-going     PT LONG TERM GOAL #4   Title tolerate lifting with Rt UE per MD orders without increased pain ( 11/29/16)    Time 17   Period Weeks   Status On-going     PT LONG TERM GOAL #5   Title improve FOTO =/< 33% limited ( 11/29/16)    Time 17   Period Weeks   Status On-going     PT LONG TERM GOAL #6   Title report no more than 2/10 pain at the end of the day in the Rt shoulder ( 10/06/16)    Time 9   Period Weeks   Status Achieved               Plan - 11/03/16 0853    Clinical Impression Statement Jim Hunter demonstrated Rt hand weakness (80# vs 105# Lt hand).  Included some hand/wrist strengthening to treatment; added to HEP.  Pt reported increase in Rt shoulder pain to 3/10 with exercise; reduced with rest and use of ice/estim at end of session. Progressing well towards remaining goals.    Rehab Potential Excellent   PT Frequency 2x / week   PT Duration --  17 wks   PT Treatment/Interventions Moist Heat;Ultrasound;Therapeutic exercise;Dry needling;Taping;Vasopneumatic  Device;Manual techniques;Neuromuscular re-education;Cryotherapy;Electrical Stimulation;Iontophoresis 4mg /ml Dexamethasone;Patient/family education;Passive range of motion   PT Next Visit Plan Continue progressive Rt shoulder ROM, strengthening; modalities and manual therapy as indicated.    Consulted and Agree with Plan of Care Patient      Patient will  benefit from skilled therapeutic intervention in order to improve the following deficits and impairments:  Postural dysfunction, Decreased strength, Pain, Increased muscle spasms, Impaired UE functional use, Decreased range of motion  Visit Diagnosis: Acute pain of right shoulder  Stiffness of right shoulder, not elsewhere classified  Muscle weakness (generalized)     Problem List Patient Active Problem List   Diagnosis Date Noted  . Healthcare maintenance 11/03/2015  . OSA (obstructive sleep apnea) 11/03/2015  . Obese 09/30/2014  . S/P right TKA 09/29/2014  . S/P knee replacement 09/29/2014  . Alcohol use 07/06/2014  . Osteoarthritis of right knee 05/26/2014  . Erectile dysfunction 05/26/2014  . GERD (gastroesophageal reflux disease) 10/02/2012  . Umbilical hernia 44/92/0100  . Hypertension 07/29/2012  . Hyperlipidemia 07/29/2012  . Obesity (BMI 30-39.9) 07/29/2012   Kerin Perna, PTA 11/03/16 9:16 AM  [facility] [place], [place], [address] Phone: [phone]   Fax:  [phone]  Name: Jim Hunter MRN: [id number] Date of Birth: [birth year]

## 2016-11-09 ENCOUNTER — Encounter: Payer: Self-pay | Admitting: Physical Therapy

## 2016-11-09 ENCOUNTER — Ambulatory Visit (INDEPENDENT_AMBULATORY_CARE_PROVIDER_SITE_OTHER): Payer: PRIVATE HEALTH INSURANCE | Admitting: Physical Therapy

## 2016-11-09 DIAGNOSIS — M25511 Pain in right shoulder: Secondary | ICD-10-CM

## 2016-11-09 DIAGNOSIS — M6281 Muscle weakness (generalized): Secondary | ICD-10-CM

## 2016-11-09 DIAGNOSIS — M25611 Stiffness of right shoulder, not elsewhere classified: Secondary | ICD-10-CM | POA: Diagnosis not present

## 2016-11-09 NOTE — Therapy (Signed)
[place], [place], [address] Phone: [phone]   Fax:  [phone]  Physical Therapy Treatment  Patient Details  Name: Jim Hunter MRN: [id number] Date of Birth: [birth year] Referring Provider: Dr. Charlotte Sanes  Encounter Date: 11/09/2016      PT End of Session - 11/09/16 1700    Visit Number 26   Number of Visits 34   Date for PT Re-Evaluation 11/29/16   PT Start Time 1700   PT Stop Time 1801   PT Time Calculation (min) 61 min   Activity Tolerance Patient tolerated treatment well      Past Medical History:  Diagnosis Date  . Arthritis   . GERD (gastroesophageal reflux disease)   . Hyperlipidemia   . Hypertension   . 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 Sleep apnea 3 years ago   no borderline no cpap needed per sleep studty done    Past Surgical History:  Procedure Laterality Date  . [place] SURGERY  2006  . ROTATOR CUFF REPAIR  2010   left  . 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   Procedure: RIGHT TOTAL KNEE ARTHROPLASTY;  Surgeon: Paralee Cancel, MD;  Location: [facility];  Service: Orthopedics;  Laterality: Right;  . TYMPANOPLASTY     age 69    There were no vitals filed for this visit.      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 Still on 5# restrictions   Patient Stated Goals drive and return to work, sleep through the night ( currently tolerates ~ 4-5 hrs)    Currently in Pain? No/denies   Pain Location Shoulder   Pain Orientation Right  anterior incision   Pain Descriptors / Indicators Burning   Pain Type Surgical pain   Pain Onset More than a month ago   Pain Frequency Intermittent   Aggravating Factors  touching and pressure through the incision, wakes up sore after being on the shoulder   Pain Relieving Factors  not needing anything                         [facility] Adult PT Treatment/Exercise - 11/09/16 0001      Shoulder Exercises: Prone   Other Prone Exercises 3x3 length of mat plank walks     Shoulder Exercises: Sidelying   External Rotation Strengthening;Right;10 reps;Weights  3 reps   External Rotation Weight (lbs) 2   Other Sidelying Exercises empty can 2#, in small ROM      Shoulder Exercises: Standing   Flexion Strengthening;Both;Weights  3x8 leaning on wall   Shoulder Flexion Weight (lbs) 3   ABduction Strengthening;Both  3x8 with 5 sec isometric hold at the end.      Shoulder Exercises: ROM/Strengthening   UBE (Upper Arm Bike) L4x6' alt FWD/FWD     Modalities   Modalities Ultrasound;Electrical Stimulation;Moist Heat;Vasopneumatic     Moist Heat Therapy   Number Minutes Moist Heat 15 Minutes   Moist Heat Location Lumbar Spine     Electrical Stimulation   Electrical Stimulation Location Rt shoulder    Electrical Stimulation Action IFC   Electrical Stimulation Parameters to tolerance   Electrical Stimulation Goals Pain     Ultrasound   Ultrasound Location Rt shoulder anterior shoulder    Ultrasound  Parameters 50% , 3.73mz, 1.0 w/cm2   Ultrasound Goals Pain     Vasopneumatic   Number Minutes Vasopneumatic  15 minutes   Vasopnuematic Location  Shoulder   Vasopneumatic Pressure Low   Vasopneumatic Temperature  3*                  PT Short Term Goals - 10/10/16 0932      PT SHORT TERM GOAL #1   Title I with initial HEP ( 09/01/16)    Time 4   Period Weeks   Status Achieved     PT SHORT TERM GOAL #2   Title demo Rt shoulder PROM WFL ( 11/15/16)    Time 12   Period Weeks   Status Achieved     PT SHORT TERM GOAL #3   Title tolerate initial elbow strengthening ( 09/01/16)    Time 4   Period Weeks   Status Achieved     PT SHORT TERM GOAL #4   Title improve FOTO =/< 55% limited ( 11/15/16)    Time 12   Period Weeks   Status Achieved      PT SHORT TERM GOAL #5   Title reduce pain =/> 50% with daily activity   Time 2   Period Weeks   Status Achieved           PT Long Term Goals - 11/09/16 1704      PT LONG TERM GOAL #1   Title I with advanced HEP ( 11/29/16)    Status On-going     PT LONG TERM GOAL #2   Title demo Rt shoulder AROM WFL ( 11/29/16)    Status Achieved     PT LONG TERM GOAL #3   Title demo Rt shoulder strength =/> 5-/5 ( 11/29/16)    Status On-going     PT LONG TERM GOAL #4   Title tolerate lifting with Rt UE per MD orders without increased pain ( 11/29/16)    Status On-going     PT LONG TERM GOAL #5   Title improve FOTO =/< 33% limited ( 11/29/16)    Status On-going     PT LONG TERM GOAL #6   Title report no more than 2/10 pain at the end of the day in the Rt shoulder ( 10/06/16)    Status Achieved               Plan - 11/09/16 1750    Clinical Impression Statement Pt tolerated ex well, fatigued in the lower trap and rhomboids with ther ex.  Tried some non thermal [place] the anterior shoulder incision to see if this will help break up scar tissue and decreased pain there. No new goals met   Rehab Potential Excellent   PT Frequency 2x / week   PT Treatment/Interventions Moist Heat;Ultrasound;Therapeutic exercise;Dry needling;Taping;Vasopneumatic Device;Manual techniques;Neuromuscular re-education;Cryotherapy;Electrical Stimulation;Iontophoresis 463mml Dexamethasone;Patient/family education;Passive range of motion   PT Next Visit Plan Continue progressive Rt shoulder ROM, strengthening; modalities and manual therapy as indicated.    Consulted and Agree with Plan of Care Patient      Patient will benefit from skilled therapeutic intervention in order to improve the following deficits and impairments:  Postural dysfunction, Decreased strength, Pain, Increased muscle spasms, Impaired UE functional use, Decreased range of motion  Visit Diagnosis: Acute pain of right shoulder  Stiffness  of right shoulder, not elsewhere classified  Muscle weakness (generalized)     Problem List Patient Active Problem List  Diagnosis Date Noted  . Healthcare maintenance 11/03/2015  . OSA (obstructive sleep apnea) 11/03/2015  . Obese 09/30/2014  . S/P right TKA 09/29/2014  . S/P knee replacement 09/29/2014  . Alcohol use 07/06/2014  . Osteoarthritis of right knee 05/26/2014  . Erectile dysfunction 05/26/2014  . GERD (gastroesophageal reflux disease) 10/02/2012  . Umbilical hernia 48/30/1599  . Hypertension 07/29/2012  . Hyperlipidemia 07/29/2012  . Obesity (BMI 30-39.9) 07/29/2012    Jeral Pinch PT  11/09/2016, 5:51 PM  [facility] [place], [place], [address] Phone: [phone]   Fax:  [phone]  Name: Jim Hunter MRN: [id number] Date of Birth: [birth year]

## 2016-11-10 ENCOUNTER — Encounter: Payer: Self-pay | Admitting: Physical Therapy

## 2016-11-10 ENCOUNTER — Ambulatory Visit (INDEPENDENT_AMBULATORY_CARE_PROVIDER_SITE_OTHER): Payer: PRIVATE HEALTH INSURANCE | Admitting: Physical Therapy

## 2016-11-10 DIAGNOSIS — M25611 Stiffness of right shoulder, not elsewhere classified: Secondary | ICD-10-CM

## 2016-11-10 DIAGNOSIS — M6281 Muscle weakness (generalized): Secondary | ICD-10-CM | POA: Diagnosis not present

## 2016-11-10 DIAGNOSIS — M25511 Pain in right shoulder: Secondary | ICD-10-CM | POA: Diagnosis not present

## 2016-11-10 NOTE — Therapy (Signed)
[place], [place], [address] Phone: [phone]   Fax:  [phone]  Physical Therapy Treatment  Patient Details  Name: Jim Hunter MRN: [id number] Date of Birth: [birth year] Referring Provider: Dr. Charlotte Sanes  Encounter Date: 11/10/2016      PT End of Session - 11/10/16 1525    Visit Number 27   Number of Visits 34   Date for PT Re-Evaluation 11/29/16   PT Start Time 1525   PT Stop Time 9798   PT Time Calculation (min) 56 min   Activity Tolerance Patient tolerated treatment well      Past Medical History:  Diagnosis Date  . Arthritis   . GERD (gastroesophageal reflux disease)   . Hyperlipidemia   . Hypertension   . 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 Sleep apnea 3 years ago   no borderline no cpap needed per sleep studty done    Past Surgical History:  Procedure Laterality Date  . [place] SURGERY  2006  . ROTATOR CUFF REPAIR  2010   left  . 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   Procedure: RIGHT TOTAL KNEE ARTHROPLASTY;  Surgeon: Paralee Cancel, MD;  Location: [facility];  Service: Orthopedics;  Laterality: Right;  . TYMPANOPLASTY     age 51    There were no vitals filed for this visit.      Subjective Assessment - 11/10/16 1525    Subjective posterior shoudler discomfort today.    Patient Stated Goals drive and return to work, sleep through the night ( currently tolerates ~ 4-5 hrs)    Currently in Pain? Yes   Pain Score 2    Pain Location Shoulder   Pain Orientation Right;Posterior;Anterior   Pain Descriptors / Indicators Discomfort   Pain Type Surgical pain   Pain Onset More than a month ago   Pain Frequency Intermittent   Aggravating Factors  sleeping funny sometimes.    Pain Relieving Factors managing it ok                         [facility] Adult PT Treatment/Exercise -  11/10/16 0001      Shoulder Exercises: Seated   Other Seated Exercises 10 reps each full cans to 90 degrees, 2# wts, bilat, single, alternating.      Shoulder Exercises: Prone   Other Prone Exercises 3x10 scarecrows     Shoulder Exercises: Standing   External Rotation Strengthening;Right;15 reps;Weights  3 set, bouncing ball on wall, shoulder abducted   Other Standing Exercises 2x10 serratus pushes off wall     Shoulder Exercises: ROM/Strengthening   UBE (Upper Arm Bike) L4x6' alt FWD/FWD     Shoulder Exercises: Stretch   Other Shoulder Stretches 10 reps, 2 sec hold supine flexion stetch with strap around wrists   Other Shoulder Stretches shoulder stretches with strap in door     Ultrasound   Ultrasound Location Rt shoulder anterior incision   Ultrasound Parameters 50%, 3.3 mhZ, 1.0 w/cm2   Ultrasound Goals Pain     Manual Therapy   Soft tissue mobilization TPR to Rt upper trap, self release with ball against wall                  PT Short Term Goals - 10/10/16 0932      PT SHORT TERM GOAL #1   Title  I with initial HEP ( 09/01/16)    Time 4   Period Weeks   Status Achieved     PT SHORT TERM GOAL #2   Title demo Rt shoulder PROM WFL ( 11/15/16)    Time 12   Period Weeks   Status Achieved     PT SHORT TERM GOAL #3   Title tolerate initial elbow strengthening ( 09/01/16)    Time 4   Period Weeks   Status Achieved     PT SHORT TERM GOAL #4   Title improve FOTO =/< 55% limited ( 11/15/16)    Time 12   Period Weeks   Status Achieved     PT SHORT TERM GOAL #5   Title reduce pain =/> 50% with daily activity   Time 2   Period Weeks   Status Achieved           PT Long Term Goals - 11/09/16 1704      PT LONG TERM GOAL #1   Title I with advanced HEP ( 11/29/16)    Status On-going     PT LONG TERM GOAL #2   Title demo Rt shoulder AROM WFL ( 11/29/16)    Status Achieved     PT LONG TERM GOAL #3   Title demo Rt shoulder strength =/> 5-/5 ( 11/29/16)     Status On-going     PT LONG TERM GOAL #4   Title tolerate lifting with Rt UE per MD orders without increased pain ( 11/29/16)    Status On-going     PT LONG TERM GOAL #5   Title improve FOTO =/< 33% limited ( 11/29/16)    Status On-going     PT LONG TERM GOAL #6   Title report no more than 2/10 pain at the end of the day in the Rt shoulder ( 10/06/16)    Status Achieved               Plan - 11/10/16 1626    Clinical Impression Statement Jim Hunter had fatigue in his shoulder after treatment. He had some catching with flexion stetches over head and with prolonged UE hold activities.  Ultrasound to Rt incision for the second time will see if this helps free up the incision and reduce his pain.    Rehab Potential Excellent   PT Frequency 2x / week   PT Treatment/Interventions Moist Heat;Ultrasound;Therapeutic exercise;Dry needling;Taping;Vasopneumatic Device;Manual techniques;Neuromuscular re-education;Cryotherapy;Electrical Stimulation;Iontophoresis 4mg /ml Dexamethasone;Patient/family education;Passive range of motion   PT Next Visit Plan Continue progressive Rt shoulder ROM, strengthening; modalities and manual therapy as indicated.    Consulted and Agree with Plan of Care Patient      Patient will benefit from skilled therapeutic intervention in order to improve the following deficits and impairments:  Postural dysfunction, Decreased strength, Pain, Increased muscle spasms, Impaired UE functional use, Decreased range of motion  Visit Diagnosis: Acute pain of right shoulder  Stiffness of right shoulder, not elsewhere classified  Muscle weakness (generalized)     Problem List Patient Active Problem List   Diagnosis Date Noted  . Healthcare maintenance 11/03/2015  . OSA (obstructive sleep apnea) 11/03/2015  . Obese 09/30/2014  . S/P right TKA 09/29/2014  . S/P knee replacement 09/29/2014  . Alcohol use 07/06/2014  . Osteoarthritis of right knee 05/26/2014  . Erectile  dysfunction 05/26/2014  . GERD (gastroesophageal reflux disease) 10/02/2012  . Umbilical hernia 16/04/9603  . Hypertension 07/29/2012  . Hyperlipidemia 07/29/2012  . Obesity (BMI 30-39.9) 07/29/2012  Jeral Pinch PT  11/10/2016, 4:28 PM  [facility] [place], [place], [address] Phone: [phone]   Fax:  [phone]  Name: Jim Hunter MRN: [id number] Date of Birth: [birth year]

## 2016-11-14 ENCOUNTER — Ambulatory Visit (INDEPENDENT_AMBULATORY_CARE_PROVIDER_SITE_OTHER): Payer: PRIVATE HEALTH INSURANCE | Admitting: Physical Therapy

## 2016-11-14 DIAGNOSIS — M25511 Pain in right shoulder: Secondary | ICD-10-CM | POA: Diagnosis not present

## 2016-11-14 DIAGNOSIS — M6281 Muscle weakness (generalized): Secondary | ICD-10-CM | POA: Diagnosis not present

## 2016-11-14 DIAGNOSIS — M25611 Stiffness of right shoulder, not elsewhere classified: Secondary | ICD-10-CM

## 2016-11-14 NOTE — Therapy (Signed)
[place], [place], [address] Phone: [phone]   Fax:  [phone]  Physical Therapy Treatment  Patient Details  Name: Jim Hunter MRN: [id number] Date of Birth: 1965-09-04 Referring Provider: Dr. Charlotte Sanes  Encounter Date: 11/14/2016      PT End of Session - 11/14/16 0854    Visit Number 28   Number of Visits 34   Date for PT Re-Evaluation 11/29/16   PT Start Time 0849   PT Stop Time 0939   PT Time Calculation (min) 50 min   Activity Tolerance Patient tolerated treatment well   Behavior During Therapy [facility] for tasks assessed/performed      Past Medical History:  Diagnosis Date  . Arthritis   . GERD (gastroesophageal reflux disease)   . Hyperlipidemia   . Hypertension   . 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 Sleep apnea 3 years ago   no borderline no cpap needed per sleep studty done    Past Surgical History:  Procedure Laterality Date  . [place] SURGERY  2006  . ROTATOR CUFF REPAIR  2010   left  . 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   Procedure: RIGHT TOTAL KNEE ARTHROPLASTY;  Surgeon: Paralee Cancel, MD;  Location: [facility];  Service: Orthopedics;  Laterality: Right;  . TYMPANOPLASTY     age 51    There were no vitals filed for this visit.      Subjective Assessment - 11/14/16 0849    Subjective Jim Hunter reports he woke up Saturday with increased soreness in Rt shoulder ("like post-workout sorenss"), attributes some discomfort to the weather.  His Rt knee has flared up due to increased walking at work.    Currently in Pain? Yes   Pain Score 2    Pain Location Shoulder   Pain Orientation Right;Posterior;Anterior   Pain Descriptors / Indicators Tightness;Discomfort   Aggravating Factors  sleep position   Pain Relieving Factors ice, massage, repositioning.    Multiple Pain Sites Yes   Pain Score 5   Pain Location Knee   Pain Orientation Right   Pain Descriptors / Indicators Aching   Aggravating Factors  prolonged walking   Pain Relieving Factors rest            [facility] PT Assessment - 11/14/16 0001      Assessment   Medical Diagnosis Rt RTC Repair   Referring Provider Dr. Harrell Gave Tuohy   Onset Date/Surgical Date 06/16/16   Next MD Visit 5/18     AROM   Right Shoulder Flexion 158 Degrees   Right Shoulder ABduction 158 Degrees           [facility] Adult PT Treatment/Exercise - 11/14/16 0001      Shoulder Exercises: Prone   Other Prone Exercises scarecrows with 2 sec hold, x 10, 2 sets - tactile cues to encourage scap depression.      Shoulder Exercises: Standing   External Rotation Both;10 reps;Theraband  3 sec hold    Theraband Level (Shoulder External Rotation) Level 2 (Red)   Other Standing Exercises rolling ball up wall, overhead in flexion x 10, in abduction x 10      Shoulder Exercises: ROM/Strengthening   UBE (Upper Arm Bike) L4x5' alt FWD/FWD   Wall Pushups 10 reps  2 sets, countertop- elbows out, elbows in.    Other ROM/Strengthening Exercises 5# onto shoulder level shelf with  RUE x 10 reps flexion, then 1 set of 10 with 3#     Shoulder Exercises: Stretch   Other Shoulder Stretches doorway stretch x 30 sec x 2 reps.      Shoulder Exercises: Body Blade   Flexion --     Cryotherapy   Number Minutes Cryotherapy 15 Minutes   Cryotherapy Location Knee   Type of Cryotherapy Ice pack     Vasopneumatic   Number Minutes Vasopneumatic  15 minutes   Vasopnuematic Location  Shoulder  Rt   Vasopneumatic Pressure Low   Vasopneumatic Temperature  3*                  PT Short Term Goals - 10/10/16 0932      PT SHORT TERM GOAL #1   Title I with initial HEP ( 09/01/16)    Time 4   Period Weeks   Status Achieved     PT SHORT TERM GOAL #2   Title demo Rt shoulder PROM WFL ( 11/15/16)    Time 12   Period Weeks   Status Achieved     PT SHORT  TERM GOAL #3   Title tolerate initial elbow strengthening ( 09/01/16)    Time 4   Period Weeks   Status Achieved     PT SHORT TERM GOAL #4   Title improve FOTO =/< 55% limited ( 11/15/16)    Time 12   Period Weeks   Status Achieved     PT SHORT TERM GOAL #5   Title reduce pain =/> 50% with daily activity   Time 2   Period Weeks   Status Achieved           PT Long Term Goals - 11/14/16 4696      PT LONG TERM GOAL #1   Title I with advanced HEP ( 11/29/16)    Time 17   Period Weeks   Status On-going     PT LONG TERM GOAL #2   Title demo Rt shoulder AROM WFL ( 11/29/16)    Time 17   Period Weeks   Status Achieved     PT LONG TERM GOAL #3   Title demo Rt shoulder strength =/> 5-/5 ( 11/29/16)    Time 17   Period Weeks   Status On-going     PT LONG TERM GOAL #4   Title tolerate lifting with Rt UE per MD orders without increased pain ( 11/29/16)    Time 17   Period Weeks   Status On-going     PT LONG TERM GOAL #5   Title improve FOTO =/< 33% limited ( 11/29/16)    Time 17   Period Weeks   Status On-going     PT LONG TERM GOAL #6   Title report no more than 2/10 pain at the end of the day in the Rt shoulder ( 10/06/16)    Time 9   Period Weeks   Status Achieved               Plan - 11/14/16 2952    Clinical Impression Statement Pt tolerated increased resistance with flexion of Rt arm (to shoulder height).  Rt shoulder AROM improved by 4 deg.  Pt progressing towards remaining goals.    Rehab Potential Excellent   PT Frequency 2x / week   PT Duration --  17 wks    PT Treatment/Interventions Moist Heat;Ultrasound;Therapeutic exercise;Dry needling;Taping;Vasopneumatic Device;Manual techniques;Neuromuscular re-education;Cryotherapy;Electrical Stimulation;Iontophoresis 4mg /ml Dexamethasone;Patient/family education;Passive range of  motion   PT Next Visit Plan Assess shoulder strength. Continue progressive Rt shoulder ROM, strengthening; modalities and manual  therapy as indicated.    Consulted and Agree with Plan of Care Patient      Patient will benefit from skilled therapeutic intervention in order to improve the following deficits and impairments:  Postural dysfunction, Decreased strength, Pain, Increased muscle spasms, Impaired UE functional use, Decreased range of motion  Visit Diagnosis: Acute pain of right shoulder  Stiffness of right shoulder, not elsewhere classified  Muscle weakness (generalized)     Problem List Patient Active Problem List   Diagnosis Date Noted  . Healthcare maintenance 11/03/2015  . OSA (obstructive sleep apnea) 11/03/2015  . Obese 09/30/2014  . S/P right TKA 09/29/2014  . S/P knee replacement 09/29/2014  . Alcohol use 07/06/2014  . Osteoarthritis of right knee 05/26/2014  . Erectile dysfunction 05/26/2014  . GERD (gastroesophageal reflux disease) 10/02/2012  . Umbilical hernia 00/17/4944  . Hypertension 07/29/2012  . Hyperlipidemia 07/29/2012  . Obesity (BMI 30-39.9) 07/29/2012   Kerin Perna, PTA 11/14/16 9:37 AM  [facility] [place], [place], [address] Phone: [phone]   Fax:  [phone]  Name: Jim Hunter MRN: [id number] Date of Birth: 10-Mar-1966

## 2016-11-16 ENCOUNTER — Ambulatory Visit (INDEPENDENT_AMBULATORY_CARE_PROVIDER_SITE_OTHER): Payer: PRIVATE HEALTH INSURANCE | Admitting: Physical Therapy

## 2016-11-16 DIAGNOSIS — M25611 Stiffness of right shoulder, not elsewhere classified: Secondary | ICD-10-CM | POA: Diagnosis not present

## 2016-11-16 DIAGNOSIS — M25511 Pain in right shoulder: Secondary | ICD-10-CM | POA: Diagnosis not present

## 2016-11-16 DIAGNOSIS — M6281 Muscle weakness (generalized): Secondary | ICD-10-CM | POA: Diagnosis not present

## 2016-11-16 NOTE — Therapy (Signed)
[place], [place], [address] Phone: [phone]   Fax:  [phone]  Physical Therapy Treatment  Patient Details  Name: Jim Hunter MRN: [id number] Date of Birth: 05/01/1966 Referring Provider: Dr. Charlotte Sanes  Encounter Date: 11/16/2016      PT End of Session - 11/16/16 1648    Visit Number 29   Number of Visits 34   Date for PT Re-Evaluation 11/29/16   PT Start Time 5462   PT Stop Time 7035   PT Time Calculation (min) 39 min   Activity Tolerance Patient tolerated treatment well;No increased pain   Behavior During Therapy WFL for tasks assessed/performed      Past Medical History:  Diagnosis Date  . Arthritis   . GERD (gastroesophageal reflux disease)   . Hyperlipidemia   . Hypertension   . 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 Sleep apnea 3 years ago   no borderline no cpap needed per sleep studty done    Past Surgical History:  Procedure Laterality Date  . [place] SURGERY  2006  . ROTATOR CUFF REPAIR  2010   left  . 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   Procedure: RIGHT TOTAL KNEE ARTHROPLASTY;  Surgeon: Paralee Cancel, MD;  Location: [facility];  Service: Orthopedics;  Laterality: Right;  . TYMPANOPLASTY     age 1    There were no vitals filed for this visit.      Subjective Assessment - 11/16/16 1641    Subjective Andi reports he has been having spasms in his Rt deltoid for 10 sec at a time sporadically.  It is a nuisence.    Currently in Pain? Yes   Pain Score 0-No pain  stiff   Pain Location Shoulder   Pain Orientation Right            [facility] PT Assessment - 11/16/16 0001      Assessment   Medical Diagnosis Rt RTC Repair   Referring Provider Dr. Harrell Gave Tuohy   Onset Date/Surgical Date 06/16/16   Hand Dominance Right   Next MD Visit 11/30/16          [facility] Adult PT  Treatment/Exercise - 11/16/16 0001      Exercises   Exercises Neck;Shoulder     Elbow Exercises   Bar Weights/Barbell (Elbow Flexion) 5 lbs  with scap retraction. 2 sets of 12     Shoulder Exercises: Standing   External Rotation Strengthening;Both;10 reps;Theraband  2 sets, 2-3 sec hold.    Theraband Level (Shoulder External Rotation) Level 3 (Green)   Extension Strengthening;Right;10 reps;Theraband   Theraband Level (Shoulder Extension) Level 4 (Blue), with body pivot    Row Strengthening;Right;12 reps;Theraband   Theraband Level (Shoulder Row) Level 4 (Blue), with body pivot - difficulty with technique     Shoulder Exercises: ROM/Strengthening   UBE (Upper Arm Bike) L4x5' alt FWD/FWD   Wall Pushups 10 reps  2 sets, countertop- elbows out, elbows in.    Rhythmic Stabilization, Seated with Rt hand on ball x 30 sec x 3 reps   Other ROM/Strengthening Exercises scap stabilization (retraction/depression) with hand on green ball : CW/CCW circles, rolling forward/backward.      Shoulder Exercises: Stretch   Other Shoulder Stretches doorway stretch x 30 sec x 2 reps.    Other Shoulder Stretches lat stretch with hands on high shelf x 30 sec x  3 reps, bicep stretch holding counter x 60 sec x 2 reps.      Neck Exercises: Stretches   Levator Stretch 3 reps - each side.              PT Short Term Goals - 10/10/16 0932      PT SHORT TERM GOAL #1   Title I with initial HEP ( 09/01/16)    Time 4   Period Weeks   Status Achieved     PT SHORT TERM GOAL #2   Title demo Rt shoulder PROM WFL ( 11/15/16)    Time 12   Period Weeks   Status Achieved     PT SHORT TERM GOAL #3   Title tolerate initial elbow strengthening ( 09/01/16)    Time 4   Period Weeks   Status Achieved     PT SHORT TERM GOAL #4   Title improve FOTO =/< 55% limited ( 11/15/16)    Time 12   Period Weeks   Status Achieved     PT SHORT TERM GOAL #5   Title reduce pain =/> 50% with daily activity   Time 2    Period Weeks   Status Achieved           PT Long Term Goals - 11/14/16 3086      PT LONG TERM GOAL #1   Title I with advanced HEP ( 11/29/16)    Time 17   Period Weeks   Status On-going     PT LONG TERM GOAL #2   Title demo Rt shoulder AROM WFL ( 11/29/16)    Time 17   Period Weeks   Status Achieved     PT LONG TERM GOAL #3   Title demo Rt shoulder strength =/> 5-/5 ( 11/29/16)    Time 17   Period Weeks   Status On-going     PT LONG TERM GOAL #4   Title tolerate lifting with Rt UE per MD orders without increased pain ( 11/29/16)    Time 17   Period Weeks   Status On-going     PT LONG TERM GOAL #5   Title improve FOTO =/< 33% limited ( 11/29/16)    Time 17   Period Weeks   Status On-going     PT LONG TERM GOAL #6   Title report no more than 2/10 pain at the end of the day in the Rt shoulder ( 10/06/16)    Time 9   Period Weeks   Status Achieved               Plan - 11/16/16 1712    Clinical Impression Statement Pt is tolerating increased resistance with Rt shoulder strengthening exercises without any symptoms.  He declined modalities today. Progressing towards remaining goals.    Rehab Potential Excellent   PT Frequency 2x / week   PT Duration --  17 wks   PT Treatment/Interventions Moist Heat;Ultrasound;Therapeutic exercise;Dry needling;Taping;Vasopneumatic Device;Manual techniques;Neuromuscular re-education;Cryotherapy;Electrical Stimulation;Iontophoresis 4mg /ml Dexamethasone;Patient/family education;Passive range of motion   PT Next Visit Plan Continue progressive Rt shoulder ROM, strengthening; modalities and manual therapy as indicated.    Consulted and Agree with Plan of Care Patient      Patient will benefit from skilled therapeutic intervention in order to improve the following deficits and impairments:  Postural dysfunction, Decreased strength, Pain, Increased muscle spasms, Impaired UE functional use, Decreased range of motion  Visit  Diagnosis: Muscle weakness (generalized)  Stiffness of right shoulder, not  elsewhere classified  Acute pain of right shoulder     Problem List Patient Active Problem List   Diagnosis Date Noted  . Healthcare maintenance 11/03/2015  . OSA (obstructive sleep apnea) 11/03/2015  . Obese 09/30/2014  . S/P right TKA 09/29/2014  . S/P knee replacement 09/29/2014  . Alcohol use 07/06/2014  . Osteoarthritis of right knee 05/26/2014  . Erectile dysfunction 05/26/2014  . GERD (gastroesophageal reflux disease) 10/02/2012  . Umbilical hernia 62/70/3500  . Hypertension 07/29/2012  . Hyperlipidemia 07/29/2012  . Obesity (BMI 30-39.9) 07/29/2012   Kerin Perna, PTA 11/16/16 5:26 PM  [place], [place], [address] Phone: [phone]   Fax:  [phone]  Name: Jayson Waterhouse MRN: [id number] Date of Birth: May 22, 1966

## 2016-11-20 ENCOUNTER — Ambulatory Visit (INDEPENDENT_AMBULATORY_CARE_PROVIDER_SITE_OTHER): Payer: PRIVATE HEALTH INSURANCE | Admitting: Physical Therapy

## 2016-11-20 DIAGNOSIS — M25511 Pain in right shoulder: Secondary | ICD-10-CM | POA: Diagnosis not present

## 2016-11-20 DIAGNOSIS — M6281 Muscle weakness (generalized): Secondary | ICD-10-CM | POA: Diagnosis not present

## 2016-11-20 DIAGNOSIS — M25611 Stiffness of right shoulder, not elsewhere classified: Secondary | ICD-10-CM

## 2016-11-20 NOTE — Therapy (Signed)
[place], [place], [address] Phone: [phone]   Fax:  [phone]  Physical Therapy Treatment  Patient Details  Name: Jim Hunter MRN: [id number] Date of Birth: June 08, 1966 Referring Provider: Dr. Charlotte Sanes  Encounter Date: 11/20/2016      PT End of Session - 11/20/16 0803    Visit Number 30   Number of Visits 34   Date for PT Re-Evaluation 11/29/16   PT Start Time 0759   PT Stop Time 0841   PT Time Calculation (min) 42 min   Activity Tolerance Patient tolerated treatment well   Behavior During Therapy [facility] for tasks assessed/performed      Past Medical History:  Diagnosis Date  . Arthritis   . GERD (gastroesophageal reflux disease)   . Hyperlipidemia   . Hypertension   . 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 Sleep apnea 3 years ago   no borderline no cpap needed per sleep studty done    Past Surgical History:  Procedure Laterality Date  . [place] SURGERY  2006  . ROTATOR CUFF REPAIR  2010   left  . 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   Procedure: RIGHT TOTAL KNEE ARTHROPLASTY;  Surgeon: Paralee Cancel, MD;  Location: [facility];  Service: Orthopedics;  Laterality: Right;  . TYMPANOPLASTY     age 51    There were no vitals filed for this visit.      Subjective Assessment - 11/20/16 0804    Subjective Pt reports continued random spasms in lateral aspect of Rt shoulder.     Currently in Pain? No/denies   Pain Score 0-No pain            [facility] PT Assessment - 11/20/16 0001      Assessment   Medical Diagnosis Rt RTC Repair   Referring Provider Dr. Harrell Gave Tuohy   Onset Date/Surgical Date 06/16/16   Hand Dominance Right   Next MD Visit 11/30/16     Strength   Right Shoulder Flexion 4+/5  with pain   Right Shoulder Extension 5/5   Right Shoulder ABduction 4/5  with pain   Right Shoulder  Internal Rotation 4+/5   Right Shoulder External Rotation 4+/5   Right Hand Grip (lbs) 85, 80   Left Hand Grip (lbs) 95, 110          [facility] Adult PT Treatment/Exercise - 11/20/16 0001      Shoulder Exercises: Prone   Horizontal ABduction 1 Right;15 reps;Weights  thumb up, tactile cues for scap depression   Horizontal ABduction 1 Weight (lbs) 1   Other Prone Exercises scarecrows with 2 sec hold, x 10,  - tactile cues to encourage scap depression.      Shoulder Exercises: Standing   External Rotation Strengthening;Both;10 reps;Theraband  2 sets, 2-3 sec hold.    Theraband Level (Shoulder External Rotation) Level 3 (Green)   Flexion Strengthening;Right;Weights;15 reps  4# too difficult.  to 90 deg.    Shoulder Flexion Weight (lbs) 3   ABduction Strengthening;Right;15 reps  scaption, to 90 deg   Shoulder ABduction Weight (lbs) 3   Extension Both;12 reps;Theraband   Theraband Level (Shoulder Extension) Level 3 (Green)   Row Strengthening;Both;12 reps;Theraband   Theraband Level (Shoulder Row) Level 3 (Green)   Other Standing Exercises D2 flexion with red band RUE x 10 x 2 sets     Shoulder Exercises:  ROM/Strengthening   UBE (Upper Arm Bike) L4x5' alt FWD/FWD     Shoulder Exercises: Stretch   Other Shoulder Stretches doorway stretch 3 positions x 30 sec x 2 reps each position.  Rt bicep stretch x 60 sec x 3 reps - 2 sets of each of these stretches.    Other Shoulder Stretches lat stretch with hands on high shelf x 30 sec x 2 reps     Modalities   Modalities --  pt declined                PT Education - 11/20/16 0836    Education provided Yes   Education Details HEP    Person(s) Educated Patient   Methods Explanation;Demonstration;Tactile cues;Handout   Comprehension Verbalized understanding;Returned demonstration          PT Short Term Goals - 10/10/16 0932      PT SHORT TERM GOAL #1   Title I with initial HEP ( 09/01/16)    Time 4   Period Weeks    Status Achieved     PT SHORT TERM GOAL #2   Title demo Rt shoulder PROM WFL ( 11/15/16)    Time 12   Period Weeks   Status Achieved     PT SHORT TERM GOAL #3   Title tolerate initial elbow strengthening ( 09/01/16)    Time 4   Period Weeks   Status Achieved     PT SHORT TERM GOAL #4   Title improve FOTO =/< 55% limited ( 11/15/16)    Time 12   Period Weeks   Status Achieved     PT SHORT TERM GOAL #5   Title reduce pain =/> 50% with daily activity   Time 2   Period Weeks   Status Achieved           PT Long Term Goals - 11/14/16 3790      PT LONG TERM GOAL #1   Title I with advanced HEP ( 11/29/16)    Time 17   Period Weeks   Status On-going     PT LONG TERM GOAL #2   Title demo Rt shoulder AROM WFL ( 11/29/16)    Time 17   Period Weeks   Status Achieved     PT LONG TERM GOAL #3   Title demo Rt shoulder strength =/> 5-/5 ( 11/29/16)    Time 17   Period Weeks   Status On-going     PT LONG TERM GOAL #4   Title tolerate lifting with Rt UE per MD orders without increased pain ( 11/29/16)    Time 17   Period Weeks   Status On-going     PT LONG TERM GOAL #5   Title improve FOTO =/< 33% limited ( 11/29/16)    Time 17   Period Weeks   Status On-going     PT LONG TERM GOAL #6   Title report no more than 2/10 pain at the end of the day in the Rt shoulder ( 10/06/16)    Time 9   Period Weeks   Status Achieved               Plan - 11/20/16 0839    Clinical Impression Statement Pt's Rt hand grip strength continues to be weaker than Lt.  Strength in Rt shoulder improving; some pain in ant/lat shoulder with break test. He tolerated all exercises, with a increase in pain to 1-2/10.  Pt declined modalities today.  He  is progressing well towards remaining goals.    Rehab Potential Excellent   PT Frequency 2x / week   PT Duration --  17 wks   PT Treatment/Interventions Moist Heat;Ultrasound;Therapeutic exercise;Dry needling;Taping;Vasopneumatic Device;Manual  techniques;Neuromuscular re-education;Cryotherapy;Electrical Stimulation;Iontophoresis 4mg /ml Dexamethasone;Patient/family education;Passive range of motion   PT Next Visit Plan Continue progressive Rt shoulder ROM, strengthening; modalities and manual therapy as indicated.    Consulted and Agree with Plan of Care Patient      Patient will benefit from skilled therapeutic intervention in order to improve the following deficits and impairments:  Postural dysfunction, Decreased strength, Pain, Increased muscle spasms, Impaired UE functional use, Decreased range of motion  Visit Diagnosis: Muscle weakness (generalized)  Stiffness of right shoulder, not elsewhere classified  Acute pain of right shoulder     Problem List Patient Active Problem List   Diagnosis Date Noted  . Healthcare maintenance 11/03/2015  . OSA (obstructive sleep apnea) 11/03/2015  . Obese 09/30/2014  . S/P right TKA 09/29/2014  . S/P knee replacement 09/29/2014  . Alcohol use 07/06/2014  . Osteoarthritis of right knee 05/26/2014  . Erectile dysfunction 05/26/2014  . GERD (gastroesophageal reflux disease) 10/02/2012  . Umbilical hernia 37/48/2707  . Hypertension 07/29/2012  . Hyperlipidemia 07/29/2012  . Obesity (BMI 30-39.9) 07/29/2012   Kerin Perna, PTA 11/20/16 9:35 AM  [place], [place], [address] Phone: [phone]   Fax:  [phone]  Name: Jim Hunter MRN: [id number] Date of Birth: 12-Oct-1965

## 2016-11-20 NOTE — Patient Instructions (Signed)
Resisted External Rotation: in Neutral - Bilateral   PALMS UP Sit or stand, tubing in both hands, elbows at sides, bent to 90, forearms forward. Pinch shoulder blades together and rotate forearms out. Keep elbows at sides. Repeat __10__ times per set. Do _2-3___ sets per session. Do _2-3___ sessions per week.   Low Row: Standing   Face anchor, feet shoulder width apart. Palms up, pull arms back, squeezing shoulder blades together. Repeat 10__ times per set. Do 2-3__ sets per session. Do 2-3__ sessions per week. Anchor Height: Waist  Strengthening: Resisted Extension   Hold tubing in both hands, arm forward. Pull arms back, elbow straight. Repeat _10___ times per set. Do 2-3____ sets per session. Do 2-3____ sessions per week.   Scapular Retraction: Abduction (Prone)    Lie with upper arms straight out from sides, elbows bent to 90. Pinch shoulder blades together and raise arms a few inches from floor. Repeat _10_ times per set. Do _1-2___ sets per session. Do __3__ sessions per week.     Standing with resistive band around each hand, bring right arm up and away, thumb back. Repeat __10__ times per set. Do _2___ sets per session. Do __3__ sessions per week.   [facility] [address] [place], [place] [address]  [phone] (office) [phone] (fax)

## 2016-11-23 ENCOUNTER — Ambulatory Visit (INDEPENDENT_AMBULATORY_CARE_PROVIDER_SITE_OTHER): Payer: PRIVATE HEALTH INSURANCE | Admitting: Physical Therapy

## 2016-11-23 DIAGNOSIS — M25511 Pain in right shoulder: Secondary | ICD-10-CM

## 2016-11-23 DIAGNOSIS — M6281 Muscle weakness (generalized): Secondary | ICD-10-CM

## 2016-11-23 DIAGNOSIS — M25611 Stiffness of right shoulder, not elsewhere classified: Secondary | ICD-10-CM

## 2016-11-23 NOTE — Therapy (Signed)
[place], [place], [address] Phone: [phone]   Fax:  [phone]  Physical Therapy Treatment  Patient Details  Name: Jim Hunter MRN: [id number] Date of Birth: 02-13-1966 Referring Provider: Dr. Charlotte Sanes  Encounter Date: 11/23/2016      PT End of Session - 11/23/16 1710    Visit Number 31   Number of Visits 34   Date for PT Re-Evaluation 11/29/16   PT Start Time 1657   PT Stop Time 1739   PT Time Calculation (min) 42 min   Activity Tolerance Patient tolerated treatment well   Behavior During Therapy [facility] for tasks assessed/performed      Past Medical History:  Diagnosis Date  . Arthritis   . GERD (gastroesophageal reflux disease)   . Hyperlipidemia   . Hypertension   . 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 Sleep apnea 3 years ago   no borderline no cpap needed per sleep studty done    Past Surgical History:  Procedure Laterality Date  . [place] SURGERY  2006  . ROTATOR CUFF REPAIR  2010   left  . 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   Procedure: RIGHT TOTAL KNEE ARTHROPLASTY;  Surgeon: Paralee Cancel, MD;  Location: [facility];  Service: Orthopedics;  Laterality: Right;  . TYMPANOPLASTY     age 21    There were no vitals filed for this visit.      Subjective Assessment - 11/23/16 1743    Subjective "I know why my Rt hand has been shaking". Pt states he notices his hand shakes more when his RUE is tired from use. He has been working on grip strength. He has returned to work, in a different position.     Pertinent History multiple orthopedic surgeries.    Patient Stated Goals drive and return to work, sleep through the night    Currently in Pain? No/denies   Pain Score 0-No pain            [facility] PT Assessment - 11/23/16 0001      Assessment   Medical Diagnosis Rt RTC Repair   Referring  Provider Dr. Harrell Gave Tuohy   Onset Date/Surgical Date 06/16/16   Hand Dominance Right   Next MD Visit 11/30/16     Precautions   Precaution Comments no lifting over 5#, no overhead lifting      Strength   Right Hand Grip (lbs) 94   Left Hand Grip (lbs) 100         [facility] Adult PT Treatment/Exercise - 11/23/16 0001      Shoulder Exercises: Standing   External Rotation Strengthening;Both;10 reps;Theraband  2 sets, 2-3 sec hold.    Theraband Level (Shoulder External Rotation) Level 2 (Red)   Row Strengthening;Right;20 reps;Theraband   Theraband Level (Shoulder Row) Level 4 (Blue)   Other Standing Exercises D2 flexion with red band RUE x 10 x 2 sets   Other Standing Exercises rolling big orange therapy ball overhead - side to side x 10 reps, bouncing over head x 50 reps, wall push up with Rt hand on ball x 10 reps x 2 sets, Rt arm on ball on wall CW/CCW x 20.  Rhythmic stabilization with hand on ball @ 110 deg x 30 sec x 2 sets       Shoulder Exercises: ROM/Strengthening   UBE (Upper Arm Bike) L5x5' alt backward/FWD  Shoulder Exercises: Stretch   Wall Stretch - Flexion 30 seconds;1 rep  RUE   Other Shoulder Stretches doorway stretch 3 positions x 30 sec x 2 reps each position.  Rt bicep stretch x 60 sec x 3 reps - 2 sets of each of these stretches.    Other Shoulder Stretches lat stretch with hands on high shelf x 30 sec x 2 reps     Modalities   Modalities --  pt declined                  PT Short Term Goals - 10/10/16 0932      PT SHORT TERM GOAL #1   Title I with initial HEP ( 09/01/16)    Time 4   Period Weeks   Status Achieved     PT SHORT TERM GOAL #2   Title demo Rt shoulder PROM WFL ( 11/15/16)    Time 12   Period Weeks   Status Achieved     PT SHORT TERM GOAL #3   Title tolerate initial elbow strengthening ( 09/01/16)    Time 4   Period Weeks   Status Achieved     PT SHORT TERM GOAL #4   Title improve FOTO =/< 55% limited ( 11/15/16)     Time 12   Period Weeks   Status Achieved     PT SHORT TERM GOAL #5   Title reduce pain =/> 50% with daily activity   Time 2   Period Weeks   Status Achieved           PT Long Term Goals - 11/23/16 1739      PT LONG TERM GOAL #1   Title I with advanced HEP ( 11/29/16)    Time 17   Period Weeks   Status On-going     PT LONG TERM GOAL #2   Title demo Rt shoulder AROM WFL ( 11/29/16)    Time 17   Period Weeks   Status Achieved     PT LONG TERM GOAL #3   Title demo Rt shoulder strength =/> 5-/5 ( 11/29/16)    Time 17   Period Weeks   Status Partially Met     PT LONG TERM GOAL #4   Title tolerate lifting with Rt UE per MD orders without increased pain ( 11/29/16)    Time 17   Period Weeks   Status On-going     PT LONG TERM GOAL #5   Title improve FOTO =/< 33% limited ( 11/29/16)    Time 17   Period Weeks   Status On-going     PT LONG TERM GOAL #6   Title report no more than 2/10 pain at the end of the day in the Rt shoulder ( 10/06/16)    Time 9   Period Weeks   Status Achieved               Plan - 11/23/16 1742    Clinical Impression Statement Jim Hunter's grip has improved this visit; he reports improved compliance with grip strength exercises.  He tolerated all exercises without symptoms other than fatigue.  Progressing well towards remaining goals.    Rehab Potential Excellent   PT Frequency 2x / week   PT Duration --  17 wks   PT Treatment/Interventions Moist Heat;Ultrasound;Therapeutic exercise;Dry needling;Taping;Vasopneumatic Device;Manual techniques;Neuromuscular re-education;Cryotherapy;Electrical Stimulation;Iontophoresis 93m/ml Dexamethasone;Patient/family education;Passive range of motion   PT Next Visit Plan Assess RUE strength and goals.  FOTO. MD note.  Consulted and Agree with Plan of Care Patient      Patient will benefit from skilled therapeutic intervention in order to improve the following deficits and impairments:  Postural dysfunction,  Decreased strength, Pain, Increased muscle spasms, Impaired UE functional use, Decreased range of motion  Visit Diagnosis: Muscle weakness (generalized)  Stiffness of right shoulder, not elsewhere classified  Acute pain of right shoulder     Problem List Patient Active Problem List   Diagnosis Date Noted  . Healthcare maintenance 11/03/2015  . OSA (obstructive sleep apnea) 11/03/2015  . Obese 09/30/2014  . S/P right TKA 09/29/2014  . S/P knee replacement 09/29/2014  . Alcohol use 07/06/2014  . Osteoarthritis of right knee 05/26/2014  . Erectile dysfunction 05/26/2014  . GERD (gastroesophageal reflux disease) 10/02/2012  . Umbilical hernia 00/71/2197  . Hypertension 07/29/2012  . Hyperlipidemia 07/29/2012  . Obesity (BMI 30-39.9) 07/29/2012   Kerin Perna, PTA 11/23/16 5:47 PM  [place], [place], [address] Phone: [phone]   Fax:  [phone]  Name: Jim Hunter MRN: [id number] Date of Birth: 1966/01/22

## 2016-11-30 ENCOUNTER — Ambulatory Visit (INDEPENDENT_AMBULATORY_CARE_PROVIDER_SITE_OTHER): Payer: PRIVATE HEALTH INSURANCE | Admitting: Physical Therapy

## 2016-11-30 DIAGNOSIS — M25511 Pain in right shoulder: Secondary | ICD-10-CM

## 2016-11-30 DIAGNOSIS — M6281 Muscle weakness (generalized): Secondary | ICD-10-CM

## 2016-11-30 DIAGNOSIS — M25611 Stiffness of right shoulder, not elsewhere classified: Secondary | ICD-10-CM | POA: Diagnosis not present

## 2016-11-30 NOTE — Therapy (Signed)
[place], [place], [address] Phone: [phone]   Fax:  [phone]  Physical Therapy Treatment  Patient Details  Name: Jim Hunter MRN: [id number] Date of Birth: Mar 31, 1966 Referring Provider: Dr. Charlotte Sanes  Encounter Date: 11/30/2016      PT End of Session - 11/30/16 1118    Visit Number 32   Number of Visits 34   PT Start Time 0109   PT Stop Time 3235   PT Time Calculation (min) 42 min   Activity Tolerance Patient tolerated treatment well;No increased pain   Behavior During Therapy WFL for tasks assessed/performed      Past Medical History:  Diagnosis Date  . Arthritis   . GERD (gastroesophageal reflux disease)   . Hyperlipidemia   . Hypertension   . 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 Sleep apnea 3 years ago   no borderline no cpap needed per sleep studty done    Past Surgical History:  Procedure Laterality Date  . [place] SURGERY  2006  . ROTATOR CUFF REPAIR  2010   left  . 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   Procedure: RIGHT TOTAL KNEE ARTHROPLASTY;  Surgeon: Paralee Cancel, MD;  Location: [facility];  Service: Orthopedics;  Laterality: Right;  . TYMPANOPLASTY     age 65    There were no vitals filed for this visit.      Subjective Assessment - 11/30/16 1122    Subjective Jim Hunter reports he just came from his surgeon's office visit.  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  The "biting" sensation in the front on his Rt shoulder is coming less frequently and with less intensity.    Currently in Pain? No/denies   Pain Score 0-No pain            [facility] PT Assessment - 11/30/16 0001      Assessment   Medical Diagnosis Rt RTC Repair   Referring Provider Dr. Harrell Gave Tuohy   Onset Date/Surgical Date 06/16/16   Hand Dominance Right    Next MD Visit 01/11/17     Observation/Other Assessments   Focus on Therapeutic Outcomes (FOTO)  61% limited.      AROM   Right/Left Shoulder Right   Right Shoulder Extension 65 Degrees   Right Shoulder Flexion 162 Degrees   Right Shoulder ABduction 158 Degrees   Right Shoulder Internal Rotation 60 Degrees   Right Shoulder External Rotation 90 Degrees     Strength   Right/Left Shoulder Right   Right Shoulder Flexion 4+/5   Right Shoulder Extension 5/5   Right Shoulder ABduction 4+/5   Right Shoulder Internal Rotation 4+/5   Right Shoulder External Rotation 4+/5   Right Hand Grip (lbs) 101   Left Hand Grip (lbs) 103           [facility] Adult PT Treatment/Exercise - 11/30/16 0001      Elbow Exercises   Elbow Flexion Strengthening;Right;20 reps;Standing  with scap retraction against noodle.    Bar Weights/Barbell (Elbow Flexion) --  6#     Shoulder Exercises: Standing   External Rotation Strengthening;Both;10 reps;Theraband  2 sets, 2-3 sec hold.    Theraband Level (Shoulder External Rotation) Level 2 (Red)   Extension Strengthening;Both;12 reps;Theraband   Theraband Level (Shoulder Extension) Level 4 (Blue)   Row  Strengthening;Right;20 reps;Theraband   Theraband Level (Shoulder Row) Level 4 (Blue)   Other Standing Exercises Rt shoulder flexion lifting 4# to shoulder height shelf x 5, 5# x 10 reps, repeated to eye level shelf with controlled descent.   D2 flexion with red band x 12 reps.    Other Standing Exercises 17# box lift (2 hands) from pallet to waist high shelf x 10 reps      Shoulder Exercises: ROM/Strengthening   UBE (Upper Arm Bike) L4x5' alt backward/FWD   Other ROM/Strengthening Exercises body blade (1.5#) RUE flexion and scaption x 30 sec x 2 reps each.      Shoulder Exercises: Stretch   Wall Stretch - Flexion 30 seconds;1 rep  RUE   Other Shoulder Stretches doorway stretch 3 positions x 30 sec x 2 reps each position.  Rt bicep stretch x 60 sec x 3 reps - 2  sets of each of these stretches.    Other Shoulder Stretches lat stretch with hands on high shelf x 30 sec x 2 reps                  PT Short Term Goals - 10/10/16 0932      PT SHORT TERM GOAL #1   Title I with initial HEP ( 09/01/16)    Time 4   Period Weeks   Status Achieved     PT SHORT TERM GOAL #2   Title demo Rt shoulder PROM WFL ( 11/15/16)    Time 12   Period Weeks   Status Achieved     PT SHORT TERM GOAL #3   Title tolerate initial elbow strengthening ( 09/01/16)    Time 4   Period Weeks   Status Achieved     PT SHORT TERM GOAL #4   Title improve FOTO =/< 55% limited ( 11/15/16)    Time 12   Period Weeks   Status Achieved     PT SHORT TERM GOAL #5   Title reduce pain =/> 50% with daily activity   Time 2   Period Weeks   Status Achieved           PT Long Term Goals - 11/30/16 1200      PT LONG TERM GOAL #1   Title I with advanced HEP ( 01/11/17)    Time 23   Period Weeks   Status Revised     PT LONG TERM GOAL #2   Title Patient demonstrates floor to waist with 50# (01/11/17)   Time 23   Period Weeks   Status Revised     PT LONG TERM GOAL #3   Title demo Rt shoulder strength =/> 5-/5 ( 01/11/17)    Time 23   Period Weeks   Status Revised     PT LONG TERM GOAL #4   Title tolerate lifting with Rt UE per MD orders without increased pain ( 01/11/17)    Time 23   Period Weeks   Status Revised  allowed to lift, still has occasional pain.      PT LONG TERM GOAL #5   Title improve FOTO =/< 33% limited (01/11/17)    Time 23   Period Weeks   Status Revised  61%, however verbalizes decreased limitations with functional activities.      PT LONG TERM GOAL #6   Title Patient to demonstrate push/pull of 25# of force for return to work tasks (01/11/17)    Time 23   Period Weeks  Status Revised               Plan - 11/30/16 1243    Clinical Impression Statement Pt's grip strength has improved in Rt hand, 20# gain since initial  assessment; very close to Lt hand.  His Rt shoulder ROM has improved, and strength is gradually improving as well.  He tolerated increased resistance with exercise, with minimal increase in pain.  Pt declined modalities; will do at home if needed.  He will benefit from continued PT intervention to improve functional mobility and return to work.    Rehab Potential Excellent   PT Treatment/Interventions Moist Heat;Ultrasound;Therapeutic exercise;Dry needling;Taping;Vasopneumatic Device;Manual techniques;Neuromuscular re-education;Cryotherapy;Electrical Stimulation;Iontophoresis 4mg /ml Dexamethasone;Patient/family education;Passive range of motion   PT Next Visit Plan Spoke to supervising PT; will request additional visits.  Will begin work strengthening with future visits.    Consulted and Agree with Plan of Care Patient      Patient will benefit from skilled therapeutic intervention in order to improve the following deficits and impairments:  Postural dysfunction, Decreased strength, Pain, Increased muscle spasms, Impaired UE functional use, Decreased range of motion  Visit Diagnosis: Muscle weakness (generalized) - Plan: PT plan of care cert/re-cert  Stiffness of right shoulder, not elsewhere classified - Plan: PT plan of care cert/re-cert  Acute pain of right shoulder - Plan: PT plan of care cert/re-cert     Problem List Patient Active Problem List   Diagnosis Date Noted  . Healthcare maintenance 11/03/2015  . OSA (obstructive sleep apnea) 11/03/2015  . Obese 09/30/2014  . S/P right TKA 09/29/2014  . S/P knee replacement 09/29/2014  . Alcohol use 07/06/2014  . Osteoarthritis of right knee 05/26/2014  . Erectile dysfunction 05/26/2014  . GERD (gastroesophageal reflux disease) 10/02/2012  . Umbilical hernia 41/63/8453  . Hypertension 07/29/2012  . Hyperlipidemia 07/29/2012  . Obesity (BMI 30-39.9) 07/29/2012   Kerin Perna, PTA 11/30/16 1:13 PM  Celyn P. Helene Kelp PT,  MPH 11/30/16 1:14 PM    [facility] [place], [place], [address] Phone: [phone]   Fax:  [phone]  Name: Jim Hunter MRN: [id number] Date of Birth: Jan 09, 1966

## 2016-12-01 ENCOUNTER — Ambulatory Visit (INDEPENDENT_AMBULATORY_CARE_PROVIDER_SITE_OTHER): Payer: PRIVATE HEALTH INSURANCE | Admitting: Physical Therapy

## 2016-12-01 DIAGNOSIS — M6281 Muscle weakness (generalized): Secondary | ICD-10-CM | POA: Diagnosis not present

## 2016-12-01 DIAGNOSIS — M25611 Stiffness of right shoulder, not elsewhere classified: Secondary | ICD-10-CM | POA: Diagnosis not present

## 2016-12-01 DIAGNOSIS — M25511 Pain in right shoulder: Secondary | ICD-10-CM

## 2016-12-01 NOTE — Therapy (Signed)
[place], [place], [address] Phone: [phone]   Fax:  [phone]  Physical Therapy Treatment  Patient Details  Name: Jim Hunter MRN: [id number] Date of Birth: Sep 26, 1965 Referring Provider: Dr. Charlotte Sanes  Encounter Date: 12/01/2016      PT End of Session - 12/01/16 0939    Visit Number 33   Number of Visits 46   Date for PT Re-Evaluation 01/11/17   PT Start Time 0930   PT Stop Time 1010   PT Time Calculation (min) 40 min   Activity Tolerance Patient tolerated treatment well   Behavior During Therapy [facility] for tasks assessed/performed      Past Medical History:  Diagnosis Date  . Arthritis   . GERD (gastroesophageal reflux disease)   . Hyperlipidemia   . Hypertension   . 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 Sleep apnea 3 years ago   no borderline no cpap needed per sleep studty done    Past Surgical History:  Procedure Laterality Date  . [place] SURGERY  2006  . ROTATOR CUFF REPAIR  2010   left  . 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   Procedure: RIGHT TOTAL KNEE ARTHROPLASTY;  Surgeon: Paralee Cancel, MD;  Location: [facility];  Service: Orthopedics;  Laterality: Right;  . TYMPANOPLASTY     age 51    There were no vitals filed for this visit.      Subjective Assessment - 12/01/16 0941    Subjective Pt reports no new changes since yesterday's appt.    Currently in Pain? No/denies   Pain Score 0-No pain            [facility] PT Assessment - 12/01/16 0001      Assessment   Medical Diagnosis Rt RTC Repair   Referring Provider Dr. Harrell Gave Tuohy   Onset Date/Surgical Date 06/16/16   Hand Dominance Right   Next MD Visit 01/11/17          [facility] Adult PT Treatment/Exercise - 12/01/16 0001      Shoulder Exercises: Prone   Other Prone Exercises prone hand walk with hands on black low table, side  stepping with feet at same time x 10 ft, 2  Rt/Lt.      Shoulder Exercises: Standing   External Rotation Strengthening;Both;10 reps;Theraband  2 sets, 2-3 sec hold.    Theraband Level (Shoulder External Rotation) Level 3 (Green)   Other Standing Exercises Rt shoulder lifting sacks 1-8# onto shoulder height shelf, 12 reps x 3 sets    Other Standing Exercises Rings on hoop Lt to/from Rt  (high  height) working endurance of shoulder in overhead motions with ER/IR.  x 3 sets     Shoulder Exercises: Pulleys   Flexion 3 minutes   ABduction 2 minutes     Shoulder Exercises: Stretch   Other Shoulder Stretches doorway stretch 3 positions x 30 sec x 2 reps each position.  Rt bicep stretch x 60 sec x 3 reps - 2 sets of each of these stretches.    Other Shoulder Stretches lat stretch with hands on high shelf x 30 sec x 2 reps     Shoulder Exercises: Body Blade   Flexion 30 seconds;3 reps   ABduction 2 reps;30 seconds  scaption           PT Short Term Goals - 10/10/16 0932  PT SHORT TERM GOAL #1   Title I with initial HEP ( 09/01/16)    Time 4   Period Weeks   Status Achieved     PT SHORT TERM GOAL #2   Title demo Rt shoulder PROM WFL ( 11/15/16)    Time 12   Period Weeks   Status Achieved     PT SHORT TERM GOAL #3   Title tolerate initial elbow strengthening ( 09/01/16)    Time 4   Period Weeks   Status Achieved     PT SHORT TERM GOAL #4   Title improve FOTO =/< 55% limited ( 11/15/16)    Time 12   Period Weeks   Status Achieved     PT SHORT TERM GOAL #5   Title reduce pain =/> 50% with daily activity   Time 2   Period Weeks   Status Achieved           PT Long Term Goals - 11/30/16 1200      PT LONG TERM GOAL #1   Title I with advanced HEP ( 01/11/17)    Time 23   Period Weeks   Status Revised     PT LONG TERM GOAL #2   Title Patient demonstrates floor to waist with 50# (01/11/17)   Time 23   Period Weeks   Status Revised     PT LONG TERM GOAL #3   Title  demo Rt shoulder strength =/> 5-/5 ( 01/11/17)    Time 23   Period Weeks   Status Revised     PT LONG TERM GOAL #4   Title tolerate lifting with Rt UE per MD orders without increased pain ( 01/11/17)    Time 23   Period Weeks   Status Revised  allowed to lift, still has occasional pain.      PT LONG TERM GOAL #5   Title improve FOTO =/< 33% limited (01/11/17)    Time 23   Period Weeks   Status Revised  61%, however verbalizes decreased limitations with functional activities.      PT LONG TERM GOAL #6   Title Patient to demonstrate push/pull of 25# of force for return to work tasks (01/11/17)    Time 23   Period Weeks   Status Revised               Plan - 12/01/16 1008    Clinical Impression Statement Pt tolerated all new exercises with no increase in pain, just fatigue.  Progressing towards remaining and new goals.    Rehab Potential Excellent   PT Frequency 2x / week   PT Treatment/Interventions Moist Heat;Ultrasound;Therapeutic exercise;Dry needling;Taping;Vasopneumatic Device;Manual techniques;Neuromuscular re-education;Cryotherapy;Electrical Stimulation;Iontophoresis 4mg /ml Dexamethasone;Patient/family education;Passive range of motion   PT Next Visit Plan Continue progressive Rt shoulder strengthening.    Consulted and Agree with Plan of Care Patient      Patient will benefit from skilled therapeutic intervention in order to improve the following deficits and impairments:  Postural dysfunction, Decreased strength, Pain, Increased muscle spasms, Impaired UE functional use, Decreased range of motion  Visit Diagnosis: Muscle weakness (generalized)  Stiffness of right shoulder, not elsewhere classified  Acute pain of right shoulder     Problem List Patient Active Problem List   Diagnosis Date Noted  . Healthcare maintenance 11/03/2015  . OSA (obstructive sleep apnea) 11/03/2015  . Obese 09/30/2014  . S/P right TKA 09/29/2014  . S/P knee replacement  09/29/2014  . Alcohol use 07/06/2014  .  Osteoarthritis of right knee 05/26/2014  . Erectile dysfunction 05/26/2014  . GERD (gastroesophageal reflux disease) 10/02/2012  . Umbilical hernia 46/19/0122  . Hypertension 07/29/2012  . Hyperlipidemia 07/29/2012  . Obesity (BMI 30-39.9) 07/29/2012   Kerin Perna, PTA 12/01/16 10:13 AM   [place], [place], [address] Phone: [phone]   Fax:  [phone]  Name: Jim Hunter MRN: [id number] Date of Birth: May 04, 1966

## 2016-12-04 ENCOUNTER — Ambulatory Visit (INDEPENDENT_AMBULATORY_CARE_PROVIDER_SITE_OTHER): Payer: 59 | Admitting: Obstetrics and Gynecology

## 2016-12-04 ENCOUNTER — Encounter: Payer: Self-pay | Admitting: Obstetrics and Gynecology

## 2016-12-04 VITALS — BP 139/80 | HR 107 | Temp 98.1°F | Wt 246.0 lb

## 2016-12-04 DIAGNOSIS — G4733 Obstructive sleep apnea (adult) (pediatric): Secondary | ICD-10-CM | POA: Diagnosis not present

## 2016-12-04 DIAGNOSIS — Z0000 Encounter for general adult medical examination without abnormal findings: Secondary | ICD-10-CM | POA: Diagnosis not present

## 2016-12-04 DIAGNOSIS — K219 Gastro-esophageal reflux disease without esophagitis: Secondary | ICD-10-CM

## 2016-12-04 DIAGNOSIS — E669 Obesity, unspecified: Secondary | ICD-10-CM | POA: Diagnosis not present

## 2016-12-04 DIAGNOSIS — N529 Male erectile dysfunction, unspecified: Secondary | ICD-10-CM | POA: Diagnosis not present

## 2016-12-04 DIAGNOSIS — I10 Essential (primary) hypertension: Secondary | ICD-10-CM

## 2016-12-04 MED ORDER — VIAGRA 100 MG PO TABS: ORAL_TABLET | ORAL | 2 refills | Status: DC

## 2016-12-04 MED ORDER — LISINOPRIL 20 MG PO TABS: 20.0000 mg | ORAL_TABLET | Freq: Every day | ORAL | 1 refills | Status: DC

## 2016-12-04 MED FILL — LISINOPRIL 20 MG TABLET: 20 | 90 days supply | Qty: 90 | Fill #0

## 2016-12-04 NOTE — Patient Instructions (Signed)
 Health Maintenance, Male A healthy lifestyle and preventive care is important for your health and wellness. Ask your health care provider about what schedule of regular examinations is right for you. What should I know about weight and diet?  Eat a Healthy Diet  Eat plenty of vegetables, fruits, whole grains, low-fat dairy products, and lean protein.  Do not eat a lot of foods high in solid fats, added sugars, or salt. Maintain a Healthy Weight  Regular exercise can help you achieve or maintain a healthy weight. You should:  Do at least 150 minutes of exercise each week. The exercise should increase your heart rate and make you sweat (moderate-intensity exercise).  Do strength-training exercises at least twice a week. Watch Your Levels of Cholesterol and Blood Lipids  Have your blood tested for lipids and cholesterol every 5 years starting at 52 years of age. If you are at high risk for heart disease, you should start having your blood tested when you are 52 years old. You may need to have your cholesterol levels checked more often if:  Your lipid or cholesterol levels are high.  You are older than 52 years of age.  You are at high risk for heart disease. What should I know about cancer screening? Many types of cancers can be detected early and may often be prevented. Lung Cancer  You should be screened every year for lung cancer if:  You are a current smoker who has smoked for at least 30 years.  You are a former smoker who has quit within the past 15 years.  Talk to your health care provider about your screening options, when you should start screening, and how often you should be screened. Colorectal Cancer  Routine colorectal cancer screening usually begins at 52 years of age and should be repeated every 5-10 years until you are 52 years old. You may need to be screened more often if early forms of precancerous polyps or small growths are found. Your health care provider  may recommend screening at an earlier age if you have risk factors for colon cancer.  Your health care provider may recommend using home test kits to check for hidden blood in the stool.  A small camera at the end of a tube can be used to examine your colon (sigmoidoscopy or colonoscopy). This checks for the earliest forms of colorectal cancer. Prostate and Testicular Cancer  Depending on your age and overall health, your health care provider may do certain tests to screen for prostate and testicular cancer.  Talk to your health care provider about any symptoms or concerns you have about testicular or prostate cancer. Skin Cancer  Check your skin from head to toe regularly.  Tell your health care provider about any new moles or changes in moles, especially if:  There is a change in a mole's size, shape, or color.  You have a mole that is larger than a pencil eraser.  Always use sunscreen. Apply sunscreen liberally and repeat throughout the day.  Protect yourself by wearing long sleeves, pants, a wide-brimmed hat, and sunglasses when outside. What should I know about heart disease, diabetes, and high blood pressure?  If you are 18-39 years of age, have your blood pressure checked every 3-5 years. If you are 40 years of age or older, have your blood pressure checked every year. You should have your blood pressure measured twice-once when you are at a hospital or clinic, and once when you are not at   a hospital or clinic. Record the average of the two measurements. To check your blood pressure when you are not at a hospital or clinic, you can use:  An automated blood pressure machine at a pharmacy.  A home blood pressure monitor.  Talk to your health care provider about your target blood pressure.  If you are between 45-79 years old, ask your health care provider if you should take aspirin to prevent heart disease.  Have regular diabetes screenings by checking your fasting blood sugar  level.  If you are at a normal weight and have a low risk for diabetes, have this test once every three years after the age of 45.  If you are overweight and have a high risk for diabetes, consider being tested at a younger age or more often.  A one-time screening for abdominal aortic aneurysm (AAA) by ultrasound is recommended for men aged 65-75 years who are current or former smokers. What should I know about preventing infection? Hepatitis B  If you have a higher risk for hepatitis B, you should be screened for this virus. Talk with your health care provider to find out if you are at risk for hepatitis B infection. Hepatitis C  Blood testing is recommended for:  Everyone born from 1945 through 1965.  Anyone with known risk factors for hepatitis C. Sexually Transmitted Diseases (STDs)  You should be screened each year for STDs including gonorrhea and chlamydia if:  You are sexually active and are younger than 52 years of age.  You are older than 52 years of age and your health care provider tells you that you are at risk for this type of infection.  Your sexual activity has changed since you were last screened and you are at an increased risk for chlamydia or gonorrhea. Ask your health care provider if you are at risk.  Talk with your health care provider about whether you are at high risk of being infected with HIV. Your health care provider may recommend a prescription medicine to help prevent HIV infection. What else can I do?  Schedule regular health, dental, and eye exams.  Stay current with your vaccines (immunizations).  Do not use any tobacco products, such as cigarettes, chewing tobacco, and e-cigarettes. If you need help quitting, ask your health care provider.  Limit alcohol intake to no more than 2 drinks per day. One drink equals 12 ounces of beer, 5 ounces of wine, or 1 ounces of hard liquor.  Do not use street drugs.  Do not share needles.  Ask your health  care provider for help if you need support or information about quitting drugs.  Tell your health care provider if you often feel depressed.  Tell your health care provider if you have ever been abused or do not feel safe at home. This information is not intended to replace advice given to you by your health care provider. Make sure you discuss any questions you have with your health care provider. Document Released: 12/30/2007 Document Revised: 03/01/2016 Document Reviewed: 04/06/2015 Elsevier Interactive Patient Education  2017 Elsevier Inc.  

## 2016-12-04 NOTE — Progress Notes (Signed)
Subjective:   51 y.o. year old male presents for preventative visit and annual physical.   Acute Concerns:   # Hypertension  Taking medicines as prescribed  needs refills so has been taking his wife medications until this appoitnment ROS: intermittent headaches (dull, "nuisance" thinks occurs when BP elevated), denies CP, SOB  #OSA Went to sleep study never heard anything back Borderline per patient No machine used Wakes up with dry mouth due to mouth breathing but denies apnea symtpoms  #GERD Under control Watches what he eats Will take omeprazole as needed  Diet: home cooked meals, well-balanced  Exercise: No regular exercise  Sexual History: currently sexually active with partner (male)  Past Medical History:  Diagnosis Date  . Arthritis   . GERD (gastroesophageal reflux disease)   . Hyperlipidemia   . Hypertension   . 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 Sleep apnea 3 years ago   no borderline no cpap needed per sleep studty done    Current Outpatient Prescriptions on File Prior to Visit  Medication Sig Dispense Refill  . docusate sodium (COLACE) 100 MG capsule Take 1 capsule (100 mg total) by mouth 2 (two) times daily. 10 capsule 0  . fluticasone (FLONASE) 50 MCG/ACT nasal spray PLACE 2 SPRAYS INTO BOTH NOSTRILS DAILY. 16 g 6  . lisinopril (PRINIVIL,ZESTRIL) 20 MG tablet Take 1 tablet (20 mg total) by mouth daily. NEEDS PCP FOLLOW-UP FOR REFILLS 30 tablet 0  . omeprazole (PRILOSEC) 20 MG capsule TAKE 1 CAPSULE BY MOUTH DAILY. 90 capsule PRN  . polyethylene glycol (MIRALAX / GLYCOLAX) packet Take 17 g by mouth 2 (two) times daily. 14 each 0  . VIAGRA 100 MG tablet TAKE 1 TABLET BY MOUTH DAILY AS NEEDED FOR ERECTILE DYSFUNCTION. 6 tablet 1   No current facility-administered medications on file prior to visit.    Social History   Social History  . Marital status: Divorced    Spouse  name: N/A  . Number of children: N/A  . Years of education: N/A   Social History Main Topics  . Smoking status: Former Smoker    Packs/day: 1.00    Years: 10.00  . Smokeless tobacco: Never Used  . Alcohol use Yes     Comment: Ocasional   . Drug use: No  . Sexual activity: Yes   Other Topics Concern  . Not on file   Social History Narrative  . No narrative on file    Health Maintenance Due  Topic Date Due  . HIV Screening  03/09/1980  . COLONOSCOPY  03/10/2015   Review of Systems: Per HPI.   Objective:  BP 139/80 (BP Location: Right Arm, Patient Position: Sitting, Cuff Size: Large)   Pulse (!) 107   Temp 98.1 F (36.7 C) (Oral)   Wt 246 lb (111.6 kg)   SpO2 95%   BMI 36.33 kg/m   Gen:  51 y.o. male in NAD, obese HEENT: NCAT, MMM, EOMI, PERRL, anicteric sclerae CV: RRR, no MRG, no JVD Resp: Non-labored, CTAB, no wheezes noted Abd: Soft, NTND, BS present, no guarding or organomegaly Ext: WWP, no edema Skin: No rashes   MSK: Full ROM, strength intact Neuro: Alert and oriented, speech normal    ASSESSMENT & PLAN: 51 y.o. male presents for annual preventative exam. Please see problem specific assessment and plan.   Luiz Blare, DO  PGY-3,  [facility] 12/04/2016  1:31 PM

## 2016-12-06 ENCOUNTER — Ambulatory Visit (INDEPENDENT_AMBULATORY_CARE_PROVIDER_SITE_OTHER): Payer: PRIVATE HEALTH INSURANCE | Admitting: Physical Therapy

## 2016-12-06 DIAGNOSIS — M25611 Stiffness of right shoulder, not elsewhere classified: Secondary | ICD-10-CM | POA: Diagnosis not present

## 2016-12-06 DIAGNOSIS — M25511 Pain in right shoulder: Secondary | ICD-10-CM

## 2016-12-06 DIAGNOSIS — M6281 Muscle weakness (generalized): Secondary | ICD-10-CM

## 2016-12-06 NOTE — Therapy (Signed)
[place]  [place], [place], [address] Phone: [phone]   Fax:  [phone]  Physical Therapy Treatment  Patient Details  Name: Jim Hunter MRN: [id number] Date of Birth: 09-28-1965 Referring Provider: Dr. Charlotte Sanes  Encounter Date: 12/06/2016      PT End of Session - 12/06/16 0814    Visit Number 34   Number of Visits 46   Date for PT Re-Evaluation 01/11/17   PT Start Time 0807   PT Stop Time 0845   PT Time Calculation (min) 38 min   Activity Tolerance Patient tolerated treatment well   Behavior During Therapy [facility] for tasks assessed/performed      Past Medical History:  Diagnosis Date  . Arthritis   . GERD (gastroesophageal reflux disease)   . Hyperlipidemia   . Hypertension   . 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 Sleep apnea 3 years ago   no borderline no cpap needed per sleep studty done    Past Surgical History:  Procedure Laterality Date  . [place] SURGERY  2006  . ROTATOR CUFF REPAIR  2010   left  . 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   Procedure: RIGHT TOTAL KNEE ARTHROPLASTY;  Surgeon: Paralee Cancel, MD;  Location: [facility];  Service: Orthopedics;  Laterality: Right;  . TYMPANOPLASTY     age 51    There were no vitals filed for this visit.      Subjective Assessment - 12/06/16 0826    Subjective Pt reports no new changes since last visit.    Patient Stated Goals drive and return to work, sleep through the night    Currently in Pain? No/denies   Pain Score 0-No pain   Pain Location Shoulder            [facility] PT Assessment - 12/06/16 0001      Assessment   Medical Diagnosis Rt RTC Repair   Referring Provider Dr. Harrell Gave Tuohy   Onset Date/Surgical Date 06/16/16   Hand Dominance Right   Next MD Visit 01/11/17          [facility] Adult PT Treatment/Exercise - 12/06/16 0001       Shoulder Exercises: Standing   Flexion Strengthening;Right;Theraband;12 reps  forward punch    Theraband Level (Shoulder Flexion) Level 4 (Blue)   Extension Strengthening;Both;20 reps;Theraband   Theraband Level (Shoulder Extension) Level 3 (Green)   Extension Limitations 2 sec hold, tactile cues for form   Row Strengthening;Right;Theraband;20 reps   Theraband Level (Shoulder Row) Level 4 (Blue)  with backward step with Rt leg. X 15 reps   Other Standing Exercises Rt shoulder lifting sacks 1-11# (11# lifted with BUE) onto shoulder height, 12 reps x 2 sets    Other Standing Exercises Sled push/pull:  35# on sled x 170 ft each direction.      Shoulder Exercises: ROM/Strengthening   UBE (Upper Arm Bike) L4x5' alt backward/FWD     Shoulder Exercises: Stretch   Other Shoulder Stretches doorway stretch 3 positions x 30 sec x 2 reps each position.  Rt bicep stretch x 60 sec x 3 reps - 2 sets of each of these stretches.    Other Shoulder Stretches lat stretch with hands on high shelf x 30 sec x 2 reps           PT Short Term Goals - 10/10/16 3149  PT SHORT TERM GOAL #1   Title I with initial HEP ( 09/01/16)    Time 4   Period Weeks   Status Achieved     PT SHORT TERM GOAL #2   Title demo Rt shoulder PROM WFL ( 11/15/16)    Time 12   Period Weeks   Status Achieved     PT SHORT TERM GOAL #3   Title tolerate initial elbow strengthening ( 09/01/16)    Time 4   Period Weeks   Status Achieved     PT SHORT TERM GOAL #4   Title improve FOTO =/< 55% limited ( 11/15/16)    Time 12   Period Weeks   Status Achieved     PT SHORT TERM GOAL #5   Title reduce pain =/> 50% with daily activity   Time 2   Period Weeks   Status Achieved           PT Long Term Goals - 12/06/16 3086      PT LONG TERM GOAL #1   Title I with advanced HEP ( 01/11/17)    Time 23   Period Weeks   Status On-going     PT LONG TERM GOAL #2   Title Patient demonstrates floor to waist with 50# (01/11/17)    Time 23   Period Weeks   Status On-going     PT LONG TERM GOAL #3   Title demo Rt shoulder strength =/> 5-/5 ( 01/11/17)    Time 23   Period Weeks   Status On-going     PT LONG TERM GOAL #4   Title tolerate lifting with Rt UE per MD orders without increased pain ( 01/11/17)    Time 23   Period Weeks   Status On-going  allowed to lift; continues to have pain.      PT LONG TERM GOAL #5   Title improve FOTO =/< 33% limited (01/11/17)    Time 23   Period Weeks   Status On-going     PT LONG TERM GOAL #6   Title Patient to demonstrate push/pull of 25# of force for return to work tasks (01/11/17)    Time 23   Period Weeks   Status On-going               Plan - 12/06/16 5784    Clinical Impression Statement Pt had spasm in Rt bicep tendon at elbow after doorway stretch; reduced with self massage and rest. He tolerated all other exercises well, without symptoms other than fatigue.  Will get specifics for work conditioning order prior to next visit.     Rehab Potential Excellent   PT Frequency 2x / week   PT Treatment/Interventions Moist Heat;Ultrasound;Therapeutic exercise;Dry needling;Taping;Vasopneumatic Device;Manual techniques;Neuromuscular re-education;Cryotherapy;Electrical Stimulation;Iontophoresis 4mg /ml Dexamethasone;Patient/family education;Passive range of motion   PT Next Visit Plan Will begin work conditioning per new order.    Consulted and Agree with Plan of Care Patient      Patient will benefit from skilled therapeutic intervention in order to improve the following deficits and impairments:  Postural dysfunction, Decreased strength, Pain, Increased muscle spasms, Impaired UE functional use, Decreased range of motion  Visit Diagnosis: Muscle weakness (generalized)  Stiffness of right shoulder, not elsewhere classified  Acute pain of right shoulder     Problem List Patient Active Problem List   Diagnosis Date Noted  . Healthcare maintenance  11/03/2015  . OSA (obstructive sleep apnea) 11/03/2015  . Obese 09/30/2014  . S/P right  TKA 09/29/2014  . S/P knee replacement 09/29/2014  . Alcohol use 07/06/2014  . Osteoarthritis of right knee 05/26/2014  . Erectile dysfunction 05/26/2014  . GERD (gastroesophageal reflux disease) 10/02/2012  . Umbilical hernia 40/34/7425  . Hypertension 07/29/2012  . Hyperlipidemia 07/29/2012  . Obesity (BMI 30-39.9) 07/29/2012   Kerin Perna, PTA 12/06/16 12:39 PM  [place], [place], [address] Phone: [phone]   Fax:  [phone]  Name: Yeudiel Mateo MRN: [id number] Date of Birth: Apr 02, 1966

## 2016-12-08 ENCOUNTER — Encounter: Payer: Self-pay | Admitting: Rehabilitative and Restorative Service Providers"

## 2016-12-11 NOTE — Assessment & Plan Note (Signed)
Diet controlled with occasional use of PPI.

## 2016-12-11 NOTE — Assessment & Plan Note (Signed)
Viagra refilled

## 2016-12-11 NOTE — Assessment & Plan Note (Signed)
Possible diagnosis. Unable to find sleep study. Patient without clinical apnea symptoms. Patient unwilling to repeat sleep study at this time due to cost. Encouraged loss of weight to help improve symptoms.

## 2016-12-11 NOTE — Assessment & Plan Note (Signed)
Referral to GI placed for colonoscopy. Patient up to date on PSA screening (normal last screen).

## 2016-12-11 NOTE — Assessment & Plan Note (Signed)
Refilled Lisinopril 20mg . Continue at current dose. Usually well-controlled on medication. Last BMP normal.

## 2016-12-11 NOTE — Assessment & Plan Note (Signed)
Discussed diet and exercise 

## 2016-12-13 ENCOUNTER — Ambulatory Visit (INDEPENDENT_AMBULATORY_CARE_PROVIDER_SITE_OTHER): Payer: PRIVATE HEALTH INSURANCE | Admitting: Physical Therapy

## 2016-12-13 DIAGNOSIS — M25611 Stiffness of right shoulder, not elsewhere classified: Secondary | ICD-10-CM | POA: Diagnosis not present

## 2016-12-13 DIAGNOSIS — M6281 Muscle weakness (generalized): Secondary | ICD-10-CM | POA: Diagnosis not present

## 2016-12-13 NOTE — Therapy (Signed)
[place], [place], [address] Phone: [phone]   Fax:  [phone]  Physical Therapy Treatment  Patient Details  Name: Jim Hunter MRN: [id number] Date of Birth: August 17, 1965 Referring Provider: Dr. Charlotte Sanes  Encounter Date: 12/13/2016      PT End of Session - 12/13/16 0728    Visit Number 35  12 approved visits for work conditioning   Number of Visits 46   Date for PT Re-Evaluation 01/11/17   PT Start Time 0719   PT Stop Time 0910   PT Time Calculation (min) 111 min   Activity Tolerance Patient tolerated treatment well   Behavior During Therapy [facility] for tasks assessed/performed      Past Medical History:  Diagnosis Date  . Arthritis   . GERD (gastroesophageal reflux disease)   . Hyperlipidemia   . Hypertension   . 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 Sleep apnea 3 years ago   no borderline no cpap needed per sleep studty done    Past Surgical History:  Procedure Laterality Date  . [place] SURGERY  2006  . ROTATOR CUFF REPAIR  2010   left  . 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   Procedure: RIGHT TOTAL KNEE ARTHROPLASTY;  Surgeon: Paralee Cancel, MD;  Location: [facility];  Service: Orthopedics;  Laterality: Right;  . TYMPANOPLASTY     age 51    There were no vitals filed for this visit.      Subjective Assessment - 12/13/16 0725    Subjective Order has been received for work conditioning (see scanned documents).  Pt reports no new changes since last week.  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 No/denies   Pain Score 0-No pain            [facility] PT Assessment - 12/13/16 0001      Assessment   Medical Diagnosis Rt RTC Repair   Referring Provider Dr. Harrell Gave  Tuohy   Onset Date/Surgical Date 06/16/16   Hand Dominance Right   Next MD Visit 01/11/17         [facility] Adult PT Treatment/Exercise - 12/13/16 0001      Exercises   Exercises Knee/Hip;Other Exercises   Other Exercises  See Work Conditioning flow sheet.      Knee/Hip Exercises: Stretches   Passive Hamstring Stretch Right;Left;3 reps;30 seconds   Gastroc Stretch Right;Left;3 reps;30 seconds     Knee/Hip Exercises: Aerobic   Stationary Bike 10 min @ 1-3Level interval.    Nustep L5: arms/legs x 12 min      Knee/Hip Exercises: Machines for Strengthening   Cybex Knee Extension 4 plates x 10 reps, 3 sets   Cybex Leg Press 7 plates x 10, 8 plates x 20.      Shoulder Exercises: Standing   Extension Strengthening;Both;20 reps;Theraband   Theraband Level (Shoulder Extension) Level 3 (Green)   Row Strengthening;Right;Theraband;20 reps   Theraband Level (Shoulder Row) Level 3 (Green)   Other Standing Exercises Rt shoulder lifting sacks 1-11# (11# lifted with BUE) onto eye level shelf; 17# box lift and carry x 80 ft x 5 laps; 17# box lift floor to waist - muliple reps.     Other Standing  Exercises Sled push/pull with 25# on sled:  80 ft x 4 reps each direction      Shoulder Exercises: ROM/Strengthening   UBE (Upper Arm Bike) L3: forward 3 min, backward 3 min    Cybex Press 3 plate;20 reps   Wall Pushups 10 reps  2 sets, countertop- elbows out, elbows in.    Plank 5 reps  5 sec; fatigues quickly   Plank Limitations (high plank on mat table)   Side Plank --  5 reps, 5 sec hold each side.    Side Plank Limitations modified (on knees and elbow)     Shoulder Exercises: Stretch   Other Shoulder Stretches doorway stretch 3 positions x 30 sec x 2 reps each position.  Rt bicep stretch x 60 sec x 3 reps - 2 sets of each of these stretches.    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   Methods Explanation;Handout;Verbal cues;Demonstration   Comprehension Verbalized understanding;Returned demonstration          PT Short Term Goals - 10/10/16 0932      PT SHORT TERM GOAL #1   Title I with initial HEP ( 09/01/16)    Time 4   Period Weeks   Status Achieved     PT SHORT TERM GOAL #2   Title demo Rt shoulder PROM WFL ( 11/15/16)    Time 12   Period Weeks   Status Achieved     PT SHORT TERM GOAL #3   Title tolerate initial elbow strengthening ( 09/01/16)    Time 4   Period Weeks   Status Achieved     PT SHORT TERM GOAL #4   Title improve FOTO =/< 55% limited ( 11/15/16)    Time 12   Period Weeks   Status Achieved     PT SHORT TERM GOAL #5   Title reduce pain =/> 50% with daily activity   Time 2   Period Weeks   Status Achieved           PT Long Term Goals - 12/06/16 7846      PT LONG TERM GOAL #1   Title I with advanced HEP ( 01/11/17)    Time 23   Period Weeks   Status On-going     PT LONG TERM GOAL #2   Title Patient demonstrates floor to waist with 50# (01/11/17)   Time 23   Period Weeks   Status On-going     PT LONG TERM GOAL #3   Title demo Rt shoulder strength =/> 5-/5 ( 01/11/17)    Time 23   Period Weeks   Status On-going     PT LONG TERM GOAL #4   Title tolerate lifting with Rt UE per MD orders without increased pain ( 01/11/17)    Time 23   Period Weeks   Status On-going  allowed to lift; continues to have pain.      PT LONG TERM GOAL #5   Title improve FOTO =/< 33% limited (01/11/17)    Time 23   Period Weeks   Status On-going     PT LONG TERM GOAL #6   Title Patient to demonstrate push/pull of 25#  of force for return to work tasks (01/11/17)    Time 23   Period Weeks   Status On-going               Plan - 12/13/16 1240    Clinical Impression Statement Pt began work conditioning today.  He  tolerated all exercises well, without symptoms other than fatigue.  Pt required a few short rest breaks due to fatigue.  Pt motivated to progress towards stated goals.    Rehab Potential Excellent   PT Frequency 3x / week  per new order, will work up to 4-5days/wk   PT Treatment/Interventions Moist Heat;Ultrasound;Therapeutic exercise;Dry needling;Taping;Vasopneumatic Device;Manual techniques;Neuromuscular re-education;Cryotherapy;Electrical Stimulation;Iontophoresis 4mg /ml Dexamethasone;Patient/family education;Passive range of motion   PT Next Visit Plan Continue work conditioning.    Consulted and Agree with Plan of Care Patient      Patient will benefit from skilled therapeutic intervention in order to improve the following deficits and impairments:  Postural dysfunction, Decreased strength, Pain, Increased muscle spasms, Impaired UE functional use, Decreased range of motion  Visit Diagnosis: Muscle weakness (generalized)  Stiffness of right shoulder, not elsewhere classified     Problem List Patient Active Problem List   Diagnosis Date Noted  . Healthcare maintenance 11/03/2015  . OSA (obstructive sleep apnea) 11/03/2015  . S/P right TKA 09/29/2014  . S/P knee replacement 09/29/2014  . Alcohol use 07/06/2014  . Osteoarthritis of right knee 05/26/2014  . Erectile dysfunction 05/26/2014  . GERD (gastroesophageal reflux disease) 10/02/2012  . Umbilical hernia 51/04/2110  . Hypertension 07/29/2012  . Hyperlipidemia 07/29/2012  . Obesity (BMI 30-39.9) 07/29/2012   Kerin Perna, PTA 12/13/16 12:48 PM  [place], [place], [address] Phone: [phone]   Fax:  [phone]  Name: Jaman Aro MRN: [id number] Date of Birth: July 19, 1965

## 2016-12-14 ENCOUNTER — Ambulatory Visit (INDEPENDENT_AMBULATORY_CARE_PROVIDER_SITE_OTHER): Payer: PRIVATE HEALTH INSURANCE | Admitting: Physical Therapy

## 2016-12-14 DIAGNOSIS — M25611 Stiffness of right shoulder, not elsewhere classified: Secondary | ICD-10-CM

## 2016-12-14 DIAGNOSIS — M6281 Muscle weakness (generalized): Secondary | ICD-10-CM | POA: Diagnosis not present

## 2016-12-14 DIAGNOSIS — M25511 Pain in right shoulder: Secondary | ICD-10-CM | POA: Diagnosis not present

## 2016-12-14 NOTE — Therapy (Signed)
[place], [place], [address] Phone: [phone]   Fax:  [phone]  Physical Therapy Treatment  Patient Details  Name: Jim Hunter MRN: [id number] Date of Birth: 1966-05-01 Referring Provider: Dr. Charlotte Sanes  Encounter Date: 12/14/2016      PT End of Session - 12/14/16 1454    Visit Number 36   Number of Visits 46   Date for PT Re-Evaluation 01/11/17   PT Start Time 0737   PT Stop Time 1062   PT Time Calculation (min) 131 min      Past Medical History:  Diagnosis Date  . Arthritis   . GERD (gastroesophageal reflux disease)   . Hyperlipidemia   . Hypertension   . 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 Sleep apnea 3 years ago   no borderline no cpap needed per sleep studty done    Past Surgical History:  Procedure Laterality Date  . [place] SURGERY  2006  . ROTATOR CUFF REPAIR  2010   left  . 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   Procedure: RIGHT TOTAL KNEE ARTHROPLASTY;  Surgeon: Paralee Cancel, MD;  Location: [facility];  Service: Orthopedics;  Laterality: Right;  . TYMPANOPLASTY     age 3    There were no vitals filed for this visit.      Subjective Assessment - 12/14/16 1704    Subjective Pt reports no new changes since last visit.    Patient Stated Goals drive and return to work, sleep through the night    Currently in Pain? No/denies   Pain Score 0-No pain          [facility] Adult PT Treatment/Exercise - 12/14/16 0001      Knee/Hip Exercises: Stretches   Passive Hamstring Stretch Right;Left;2 reps;30 seconds   Quad Stretch Right;Left;2 reps;30 seconds   Gastroc Stretch Right;Left;2 reps;30 seconds     Knee/Hip Exercises: Aerobic   Stationary Bike 10 min @ 6-7 Level interval.    Nustep L6: arms/legs x 20 min      Knee/Hip Exercises: Machines for Strengthening   Cybex Knee Extension 4 plates  x 10 reps, 2 sets   Cybex Leg Press 8 plates,, 2 x10     Shoulder Exercises: Standing   Other Standing Exercises Rt shoulder lifting sacks 1-11# (11# lifted with BUE) onto eye level shelf; 17# box lift and carry x 80 ft x 5 laps; 17# box lift floor to waist - muliple reps.     Other Standing Exercises Sled push/pull with 25# on sled:  80 ft x 5 reps each direction      Shoulder Exercises: ROM/Strengthening   UBE (Upper Arm Bike) L5: forward 5 min, backward 5 min    Cybex Press 3 plate;20 reps   Wall Pushups 10 reps  2 sets, countertop- elbows out, elbows in.    Plank 5 reps  10 sec; fatigues quickly   Plank Limitations (high plank on mat table)   Side Plank --  4 reps, 5 sec hold each side.    Side Plank Limitations modified (on knees and elbow)   Other ROM/Strengthening Exercises tricep ext: 2 plates x 10 x 2; bicep curls 2 plates: 2 x 10; lat pull down 2 plates x 10 x 2 sets     Shoulder Exercises: Stretch   Other Shoulder Stretches doorway stretch 3 positions x 30 sec  x 2 reps each position.  Rt bicep stretch x 60 sec x 3 reps - 2 sets of each of these stretches.    Other Shoulder Stretches lat stretch with hands on high shelf x 30 sec x 4 rep                  PT Short Term Goals - 10/10/16 0932      PT SHORT TERM GOAL #1   Title I with initial HEP ( 09/01/16)    Time 4   Period Weeks   Status Achieved     PT SHORT TERM GOAL #2   Title demo Rt shoulder PROM WFL ( 11/15/16)    Time 12   Period Weeks   Status Achieved     PT SHORT TERM GOAL #3   Title tolerate initial elbow strengthening ( 09/01/16)    Time 4   Period Weeks   Status Achieved     PT SHORT TERM GOAL #4   Title improve FOTO =/< 55% limited ( 11/15/16)    Time 12   Period Weeks   Status Achieved     PT SHORT TERM GOAL #5   Title reduce pain =/> 50% with daily activity   Time 2   Period Weeks   Status Achieved           PT Long Term Goals - 12/06/16 1856      PT LONG TERM GOAL #1    Title I with advanced HEP ( 01/11/17)    Time 23   Period Weeks   Status On-going     PT LONG TERM GOAL #2   Title Patient demonstrates floor to waist with 50# (01/11/17)   Time 23   Period Weeks   Status On-going     PT LONG TERM GOAL #3   Title demo Rt shoulder strength =/> 5-/5 ( 01/11/17)    Time 23   Period Weeks   Status On-going     PT LONG TERM GOAL #4   Title tolerate lifting with Rt UE per MD orders without increased pain ( 01/11/17)    Time 23   Period Weeks   Status On-going  allowed to lift; continues to have pain.      PT LONG TERM GOAL #5   Title improve FOTO =/< 33% limited (01/11/17)    Time 23   Period Weeks   Status On-going     PT LONG TERM GOAL #6   Title Patient to demonstrate push/pull of 25# of force for return to work tasks (01/11/17)    Time 23   Period Weeks   Status On-going               Plan - 12/14/16 1701    Clinical Impression Statement Pt tolerated increased time with work conditioning, including longer time on aerobic machines. Pt required less rest breaks today.  Pt will benefit from continued work conditioning to prepare to return to work safely.    Rehab Potential Excellent   PT Frequency 3x / week   PT Treatment/Interventions Moist Heat;Ultrasound;Therapeutic exercise;Dry needling;Taping;Vasopneumatic Device;Manual techniques;Neuromuscular re-education;Cryotherapy;Electrical Stimulation;Iontophoresis 4mg /ml Dexamethasone;Patient/family education;Passive range of motion   PT Next Visit Plan Continue work conditioning.    Consulted and Agree with Plan of Care Patient      Patient will benefit from skilled therapeutic intervention in order to improve the following deficits and impairments:  Postural dysfunction, Decreased strength, Pain, Increased muscle spasms, Impaired UE functional use, Decreased  range of motion  Visit Diagnosis: Muscle weakness (generalized)  Stiffness of right shoulder, not elsewhere classified  Acute  pain of right shoulder     Problem List Patient Active Problem List   Diagnosis Date Noted  . Healthcare maintenance 11/03/2015  . OSA (obstructive sleep apnea) 11/03/2015  . S/P right TKA 09/29/2014  . S/P knee replacement 09/29/2014  . Alcohol use 07/06/2014  . Osteoarthritis of right knee 05/26/2014  . Erectile dysfunction 05/26/2014  . GERD (gastroesophageal reflux disease) 10/02/2012  . Umbilical hernia 47/03/2956  . Hypertension 07/29/2012  . Hyperlipidemia 07/29/2012  . Obesity (BMI 30-39.9) 07/29/2012   Kerin Perna, PTA 12/14/16 5:04 PM  [place], [place], [address] Phone: [phone]   Fax:  [phone]  Name: Dontario Evetts MRN: [id number] Date of Birth: [birth year]

## 2016-12-15 ENCOUNTER — Ambulatory Visit (INDEPENDENT_AMBULATORY_CARE_PROVIDER_SITE_OTHER): Payer: PRIVATE HEALTH INSURANCE | Admitting: Physical Therapy

## 2016-12-15 DIAGNOSIS — M25611 Stiffness of right shoulder, not elsewhere classified: Secondary | ICD-10-CM

## 2016-12-15 DIAGNOSIS — M6281 Muscle weakness (generalized): Secondary | ICD-10-CM

## 2016-12-15 NOTE — Therapy (Signed)
[place] [address] [place], [place], [address] Phone: [phone]   Fax:  [phone]  Physical Therapy Treatment  Patient Details  Name: Jim Hunter MRN: [id number] Date of Birth: 26-Dec-1965 Referring Provider: Dr. Charlotte Sanes  Encounter Date: 12/15/2016      PT End of Session - 12/15/16 1352    Visit Number 37   Number of Visits 46   Date for PT Re-Evaluation 01/11/17   PT Start Time 1330   PT Stop Time 1630   PT Time Calculation (min) 180 min   Activity Tolerance Patient tolerated treatment well   Behavior During Therapy [facility] for tasks assessed/performed      Past Medical History:  Diagnosis Date  . Arthritis   . GERD (gastroesophageal reflux disease)   . Hyperlipidemia   . Hypertension   . 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 Sleep apnea 3 years ago   no borderline no cpap needed per sleep studty done    Past Surgical History:  Procedure Laterality Date  . [place] SURGERY  2006  . ROTATOR CUFF REPAIR  2010   left  . 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   Procedure: RIGHT TOTAL KNEE ARTHROPLASTY;  Surgeon: Paralee Cancel, MD;  Location: [facility];  Service: Orthopedics;  Laterality: Right;  . TYMPANOPLASTY     age 51    There were no vitals filed for this visit.      Subjective Assessment - 12/15/16 1348    Subjective Pt reports he is a little sore "everywhere" from work outs over last 2 days.  Sleep is slightly improved over last 2 wks.    Patient Stated Goals drive and return to work, sleep through the night    Currently in Pain? Yes   Pain Score 4    Pain Location Shoulder   Pain Orientation Right;Left   Pain Descriptors / Indicators Sore   Aggravating Factors  sleep position   Pain Relieving Factors stretches, rest.             [facility] PT Assessment - 12/15/16 0001      Assessment   Medical Diagnosis Rt  RTC Repair   Next MD Visit 01/11/17          [facility] Adult PT Treatment/Exercise - 12/15/16 0001      Exercises   Other Exercises  See Work Conditioning flow sheet.      Knee/Hip Exercises: Stretches   Passive Hamstring Stretch Right;Left;30 seconds;3 reps  2 sets   Quad Stretch Right;Left;2 reps;30 seconds   Gastroc Stretch Right;Left;2 reps;30 seconds  2 sets     Knee/Hip Exercises: Aerobic   Stationary Bike 10 min @ 6-7 Level interval.    Nustep L6: arms/legs x 20 min      Knee/Hip Exercises: Machines for Strengthening   Cybex Knee Extension 4 plates x 10 reps, 3 sets   Cybex Leg Press 8 plates,, 2 x10     Knee/Hip Exercises: Standing   Heel Raises 3 sets;Both   SLS 30" x 3 reps each leg     Shoulder Exercises: Standing   Other Standing Exercises 17# box lift and carry, 80 ft, 48 laps.    Other Standing Exercises Sled push/pull with 25# on sled:  80 ft x 5 reps each direction      Shoulder Exercises: ROM/Strengthening   UBE (Upper Arm Bike) L5: forward  5 min, backward 5 min    Cybex Press 3 plate;20 reps   Wall Pushups 10 reps  2 sets, countertop- elbows out, elbows in.    Other ROM/Strengthening Exercises tricep ext: 2 plates x 10 x 2; bicep curls 2 plates: 2 x 10; lat pull down 2 plates x 10 x 2 sets     Shoulder Exercises: Stretch   Other Shoulder Stretches doorway stretch 3 positions x 30 sec x 2 reps each position.  Rt bicep stretch x 60 sec x 3 reps - 2 sets of each of these stretches.    Other Shoulder Stretches lat stretch with hands on high shelf x 30 sec x 4 rep     Shoulder Exercises: Body Blade   Flexion 30 seconds;3 reps   ABduction 2 reps;30 seconds  scaption                   PT Short Term Goals - 10/10/16 0932      PT SHORT TERM GOAL #1   Title I with initial HEP ( 09/01/16)    Time 4   Period Weeks   Status Achieved     PT SHORT TERM GOAL #2   Title demo Rt shoulder PROM WFL ( 11/15/16)    Time 12   Period Weeks   Status Achieved      PT SHORT TERM GOAL #3   Title tolerate initial elbow strengthening ( 09/01/16)    Time 4   Period Weeks   Status Achieved     PT SHORT TERM GOAL #4   Title improve FOTO =/< 55% limited ( 11/15/16)    Time 12   Period Weeks   Status Achieved     PT SHORT TERM GOAL #5   Title reduce pain =/> 50% with daily activity   Time 2   Period Weeks   Status Achieved           PT Long Term Goals - 12/06/16 3220      PT LONG TERM GOAL #1   Title I with advanced HEP ( 01/11/17)    Time 23   Period Weeks   Status On-going     PT LONG TERM GOAL #2   Title Patient demonstrates floor to waist with 50# (01/11/17)   Time 23   Period Weeks   Status On-going     PT LONG TERM GOAL #3   Title demo Rt shoulder strength =/> 5-/5 ( 01/11/17)    Time 23   Period Weeks   Status On-going     PT LONG TERM GOAL #4   Title tolerate lifting with Rt UE per MD orders without increased pain ( 01/11/17)    Time 23   Period Weeks   Status On-going  allowed to lift; continues to have pain.      PT LONG TERM GOAL #5   Title improve FOTO =/< 33% limited (01/11/17)    Time 23   Period Weeks   Status On-going     PT LONG TERM GOAL #6   Title Patient to demonstrate push/pull of 25# of force for return to work tasks (01/11/17)    Time 23   Period Weeks   Status On-going               Plan - 12/15/16 1626    Clinical Impression Statement Pt tolerated additional hour of work conditioning without increase in symptoms in shoulder; just overall fatigue.  Pt remains motivated  to progress towards meeting stated goals.    Rehab Potential Excellent   PT Frequency 3x / week   PT Treatment/Interventions Moist Heat;Ultrasound;Therapeutic exercise;Dry needling;Taping;Vasopneumatic Device;Manual techniques;Neuromuscular re-education;Cryotherapy;Electrical Stimulation;Iontophoresis 4mg /ml Dexamethasone;Patient/family education;Passive range of motion   PT Next Visit Plan Continue work conditioning,  progress towards 4 hours.    Consulted and Agree with Plan of Care Patient      Patient will benefit from skilled therapeutic intervention in order to improve the following deficits and impairments:  Postural dysfunction, Decreased strength, Pain, Increased muscle spasms, Impaired UE functional use, Decreased range of motion  Visit Diagnosis: Muscle weakness (generalized)  Stiffness of right shoulder, not elsewhere classified     Problem List Patient Active Problem List   Diagnosis Date Noted  . Healthcare maintenance 11/03/2015  . OSA (obstructive sleep apnea) 11/03/2015  . S/P right TKA 09/29/2014  . S/P knee replacement 09/29/2014  . Alcohol use 07/06/2014  . Osteoarthritis of right knee 05/26/2014  . Erectile dysfunction 05/26/2014  . GERD (gastroesophageal reflux disease) 10/02/2012  . Umbilical hernia 16/83/7290  . Hypertension 07/29/2012  . Hyperlipidemia 07/29/2012  . Obesity (BMI 30-39.9) 07/29/2012   Kerin Perna, PTA 12/15/16 4:32 PM  [facility] [place], [place], [address] Phone: [phone]   Fax:  [phone]  Name: Jim Hunter MRN: [id number] Date of Birth: 1966/07/03

## 2016-12-18 ENCOUNTER — Ambulatory Visit (INDEPENDENT_AMBULATORY_CARE_PROVIDER_SITE_OTHER): Payer: PRIVATE HEALTH INSURANCE | Admitting: Physical Therapy

## 2016-12-18 DIAGNOSIS — M25611 Stiffness of right shoulder, not elsewhere classified: Secondary | ICD-10-CM

## 2016-12-18 DIAGNOSIS — M6281 Muscle weakness (generalized): Secondary | ICD-10-CM

## 2016-12-18 DIAGNOSIS — M25511 Pain in right shoulder: Secondary | ICD-10-CM

## 2016-12-18 NOTE — Therapy (Signed)
[place], [place], [address] Phone: [phone]   Fax:  [phone]  Physical Therapy Treatment  Patient Details  Name: Jim Hunter MRN: [id number] Date of Birth: 03-Apr-1966 Referring Provider: Dr. Charlotte Sanes  Encounter Date: 12/18/2016      PT End of Session - 12/18/16 1418    Visit Number 38   Number of Visits 46   Date for PT Re-Evaluation 01/11/17   PT Start Time 1400   PT Stop Time 1700   PT Time Calculation (min) 180 min   Activity Tolerance Patient tolerated treatment well   Behavior During Therapy [facility] for tasks assessed/performed      Past Medical History:  Diagnosis Date  . Arthritis   . GERD (gastroesophageal reflux disease)   . Hyperlipidemia   . Hypertension   . 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 Sleep apnea 3 years ago   no borderline no cpap needed per sleep studty done    Past Surgical History:  Procedure Laterality Date  . [place] SURGERY  2006  . ROTATOR CUFF REPAIR  2010   left  . 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   Procedure: RIGHT TOTAL KNEE ARTHROPLASTY;  Surgeon: Paralee Cancel, MD;  Location: [facility];  Service: Orthopedics;  Laterality: Right;  . TYMPANOPLASTY     age 51    There were no vitals filed for this visit.      Subjective Assessment - 12/18/16 1419    Subjective Pt reports his Rt knee has been sore since doing more activity.  Back of the knee feels tight.  Overall, just reporting muscle soreness from increased exercise.    Patient Stated Goals drive and return to work, sleep through the night    Currently in Pain? Yes   Pain Score 6    Pain Location Knee   Pain Orientation Right   Pain Descriptors / Indicators Sore;Tightness   Aggravating Factors  exercise   Pain Relieving Factors rest            [facility] PT Assessment - 12/18/16 0001      Assessment    Medical Diagnosis Rt RTC Repair   Referring Provider Dr. Harrell Gave Tuohy   Onset Date/Surgical Date 06/16/16   Hand Dominance Right   Next MD Visit 01/11/17     Strength   Right Shoulder Flexion 4+/5  with discomfort   Right Shoulder Extension 5/5   Right Shoulder ABduction 4+/5  with discomfort   Right Shoulder Internal Rotation --  5-/5   Right Shoulder External Rotation 4+/5   Right Hand Grip (lbs) 100, 119   Left Hand Grip (lbs) 120                     [facility] Adult PT Treatment/Exercise - 12/18/16 0001      Exercises   Other Exercises  See Work Conditioning flow sheet.      Knee/Hip Exercises: Stretches   Passive Hamstring Stretch Right;Left;30 seconds;3 reps  2 sets   Quad Stretch Right;Left;2 reps;30 seconds  trial in standing   Gastroc Stretch Right;Left;30 seconds;4 reps  2 sets     Knee/Hip Exercises: Aerobic   Stationary Bike 15 min @ L4-6 x 2 sets   Nustep L6: arms/legs x 30 min      Knee/Hip Exercises: Machines for Strengthening   Cybex Knee Extension 4  plates x 10 reps, 3 sets   Cybex Leg Press 8 plates,, 2 x10     Knee/Hip Exercises: Standing   Forward Lunges Right;Left;1 set;10 reps   Side Lunges 10 reps;Right;Left   Functional Squat 3 sets;20 reps  to black mat   SLS 20 ball throws to trampoline each leg standing on blue therapad   SLS with Vectors 10 ball throws, each leg at 10/ 2 o'clock      Knee/Hip Exercises: Supine   Bridges Limitations 5# on hips, 10 reps, 3 sec hold      Shoulder Exercises: Standing   Other Standing Exercises 17# box lift and carry, 80 ft, 10 laps.    Other Standing Exercises Sled push/pull with 25# on sled:  80 ft x 10x reps each direction, pausing intermittently to reach overhead       Shoulder Exercises: ROM/Strengthening   UBE (Upper Arm Bike) L5: forward 5 min, backward 5 min    Plank 30 seconds  10 reps      Shoulder Exercises: Stretch   Other Shoulder Stretches doorway stretch 3 positions x 30  sec x 2 reps each position.  Rt bicep stretch x 60 sec x 3 reps - 2 sets of each of these stretches.                   PT Short Term Goals - 10/10/16 0932      PT SHORT TERM GOAL #1   Title I with initial HEP ( 09/01/16)    Time 4   Period Weeks   Status Achieved     PT SHORT TERM GOAL #2   Title demo Rt shoulder PROM WFL ( 11/15/16)    Time 12   Period Weeks   Status Achieved     PT SHORT TERM GOAL #3   Title tolerate initial elbow strengthening ( 09/01/16)    Time 4   Period Weeks   Status Achieved     PT SHORT TERM GOAL #4   Title improve FOTO =/< 55% limited ( 11/15/16)    Time 12   Period Weeks   Status Achieved     PT SHORT TERM GOAL #5   Title reduce pain =/> 50% with daily activity   Time 2   Period Weeks   Status Achieved           PT Long Term Goals - 12/06/16 7124      PT LONG TERM GOAL #1   Title I with advanced HEP ( 01/11/17)    Time 23   Period Weeks   Status On-going     PT LONG TERM GOAL #2   Title Patient demonstrates floor to waist with 50# (01/11/17)   Time 23   Period Weeks   Status On-going     PT LONG TERM GOAL #3   Title demo Rt shoulder strength =/> 5-/5 ( 01/11/17)    Time 23   Period Weeks   Status On-going     PT LONG TERM GOAL #4   Title tolerate lifting with Rt UE per MD orders without increased pain ( 01/11/17)    Time 23   Period Weeks   Status On-going  allowed to lift; continues to have pain.      PT LONG TERM GOAL #5   Title improve FOTO =/< 33% limited (01/11/17)    Time 23   Period Weeks   Status On-going     PT LONG TERM GOAL #6  Title Patient to demonstrate push/pull of 25# of force for return to work tasks (01/11/17)    Time 23   Period Weeks   Status On-going               Plan - 12/18/16 1713    Clinical Impression Statement Pt reporting increased symptoms in Rt knee (TKA, previously treated in therapy) since initiation of work conditioning.  Pt reported reduction in pain at end of  session.  Encouraged pt to ice knee at home if needed and continued gentle stretches this evening to reduce post-workout tightness.  Grip strength has improved.  Rt shoulder continues to have some strength deficits in flexion, Abdct and ER.  pt progressing towards goals.    Rehab Potential Excellent   PT Frequency 3x / week  working up to 4-5day/wk   PT Treatment/Interventions Moist Heat;Ultrasound;Therapeutic exercise;Dry needling;Taping;Vasopneumatic Device;Manual techniques;Neuromuscular re-education;Cryotherapy;Electrical Stimulation;Iontophoresis 4mg /ml Dexamethasone;Patient/family education;Passive range of motion   PT Next Visit Plan Continue work conditioning, progress towards 4 hours.    Consulted and Agree with Plan of Care Patient      Patient will benefit from skilled therapeutic intervention in order to improve the following deficits and impairments:  Postural dysfunction, Decreased strength, Pain, Increased muscle spasms, Impaired UE functional use, Decreased range of motion  Visit Diagnosis: Muscle weakness (generalized)  Stiffness of right shoulder, not elsewhere classified  Acute pain of right shoulder     Problem List Patient Active Problem List   Diagnosis Date Noted  . Healthcare maintenance 11/03/2015  . OSA (obstructive sleep apnea) 11/03/2015  . S/P right TKA 09/29/2014  . S/P knee replacement 09/29/2014  . Alcohol use 07/06/2014  . Osteoarthritis of right knee 05/26/2014  . Erectile dysfunction 05/26/2014  . GERD (gastroesophageal reflux disease) 10/02/2012  . Umbilical hernia 97/94/8016  . Hypertension 07/29/2012  . Hyperlipidemia 07/29/2012  . Obesity (BMI 30-39.9) 07/29/2012   Kerin Perna, PTA 12/18/16 5:20 PM  [place], [place], [address] Phone: [phone]   Fax:  [phone]  Name: Saathvik Every MRN: [id number] Date of Birth: Feb 03, 1966

## 2016-12-19 MED FILL — SILDENAFIL 100 MG TABLET: 100 | 30 days supply | Qty: 6 | Fill #0

## 2016-12-20 ENCOUNTER — Ambulatory Visit (INDEPENDENT_AMBULATORY_CARE_PROVIDER_SITE_OTHER): Payer: PRIVATE HEALTH INSURANCE | Admitting: Physical Therapy

## 2016-12-20 DIAGNOSIS — M25611 Stiffness of right shoulder, not elsewhere classified: Secondary | ICD-10-CM | POA: Diagnosis not present

## 2016-12-20 DIAGNOSIS — M6281 Muscle weakness (generalized): Secondary | ICD-10-CM

## 2016-12-20 DIAGNOSIS — M25511 Pain in right shoulder: Secondary | ICD-10-CM

## 2016-12-20 NOTE — Therapy (Signed)
[place], [place], [address] Phone: [phone]   Fax:  [phone]  Physical Therapy Treatment  Patient Details  Name: Jim Hunter MRN: [id number] Date of Birth: [birth year] Referring Provider: Dr. Charlotte Sanes  Encounter Date: 12/20/2016      PT End of Session - 12/20/16 1658    Visit Number 39   Number of Visits 46   Date for PT Re-Evaluation 01/11/17   PT Start Time 1400   PT Stop Time 1700   PT Time Calculation (min) 180 min   Activity Tolerance Patient tolerated treatment well      Past Medical History:  Diagnosis Date  . Arthritis   . GERD (gastroesophageal reflux disease)   . Hyperlipidemia   . Hypertension   . 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 Sleep apnea 3 years ago   no borderline no cpap needed per sleep studty done    Past Surgical History:  Procedure Laterality Date  . [place] SURGERY  2006  . ROTATOR CUFF REPAIR  2010   left  . 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   Procedure: RIGHT TOTAL KNEE ARTHROPLASTY;  Surgeon: Paralee Cancel, MD;  Location: [facility];  Service: Orthopedics;  Laterality: Right;  . TYMPANOPLASTY     age 51    There were no vitals filed for this visit.      Subjective Assessment - 12/20/16 1659    Subjective Pt reports his Rt knee is feeling much better.  He feels he is getting stronger each session.    Patient Stated Goals drive and return to work, sleep through the night    Currently in Pain? No/denies   Pain Score 0-No pain            [facility] PT Assessment - 12/20/16 0001      Assessment   Medical Diagnosis Rt RTC Repair   Onset Date/Surgical Date 06/16/16   Hand Dominance Right   Next MD Visit 01/11/17          [facility] Adult PT Treatment/Exercise - 12/20/16 0001      Exercises   Other Exercises  See Work Conditioning flow sheet.            PT  Short Term Goals - 10/10/16 0932      PT SHORT TERM GOAL #1   Title I with initial HEP ( 09/01/16)    Time 4   Period Weeks   Status Achieved     PT SHORT TERM GOAL #2   Title demo Rt shoulder PROM WFL ( 11/15/16)    Time 12   Period Weeks   Status Achieved     PT SHORT TERM GOAL #3   Title tolerate initial elbow strengthening ( 09/01/16)    Time 4   Period Weeks   Status Achieved     PT SHORT TERM GOAL #4   Title improve FOTO =/< 55% limited ( 11/15/16)    Time 12   Period Weeks   Status Achieved     PT SHORT TERM GOAL #5   Title reduce pain =/> 50% with daily activity   Time 2   Period Weeks   Status Achieved           PT Long Term Goals - 12/06/16 1324      PT LONG TERM GOAL #1   Title I with  advanced HEP ( 01/11/17)    Time 23   Period Weeks   Status On-going     PT LONG TERM GOAL #2   Title Patient demonstrates floor to waist with 50# (01/11/17)   Time 23   Period Weeks   Status On-going     PT LONG TERM GOAL #3   Title demo Rt shoulder strength =/> 5-/5 ( 01/11/17)    Time 23   Period Weeks   Status On-going     PT LONG TERM GOAL #4   Title tolerate lifting with Rt UE per MD orders without increased pain ( 01/11/17)    Time 23   Period Weeks   Status On-going  allowed to lift; continues to have pain.      PT LONG TERM GOAL #5   Title improve FOTO =/< 33% limited (01/11/17)    Time 23   Period Weeks   Status On-going     PT LONG TERM GOAL #6   Title Patient to demonstrate push/pull of 25# of force for return to work tasks (01/11/17)    Time 23   Period Weeks   Status On-going               Plan - 12/20/16 1708    Rehab Potential Excellent   PT Frequency 3x / week   PT Treatment/Interventions Moist Heat;Ultrasound;Therapeutic exercise;Dry needling;Taping;Vasopneumatic Device;Manual techniques;Neuromuscular re-education;Cryotherapy;Electrical Stimulation;Iontophoresis 4mg /ml Dexamethasone;Patient/family education;Passive range of motion    PT Next Visit Plan Continue work conditioning, progress towards 4 hours.    Consulted and Agree with Plan of Care Patient      Patient will benefit from skilled therapeutic intervention in order to improve the following deficits and impairments:  Postural dysfunction, Decreased strength, Pain, Increased muscle spasms, Impaired UE functional use, Decreased range of motion  Visit Diagnosis: Muscle weakness (generalized)  Stiffness of right shoulder, not elsewhere classified  Acute pain of right shoulder     Problem List Patient Active Problem List   Diagnosis Date Noted  . Healthcare maintenance 11/03/2015  . OSA (obstructive sleep apnea) 11/03/2015  . S/P right TKA 09/29/2014  . S/P knee replacement 09/29/2014  . Alcohol use 07/06/2014  . Osteoarthritis of right knee 05/26/2014  . Erectile dysfunction 05/26/2014  . GERD (gastroesophageal reflux disease) 10/02/2012  . Umbilical hernia 21/05/7355  . Hypertension 07/29/2012  . Hyperlipidemia 07/29/2012  . Obesity (BMI 30-39.9) 07/29/2012   Kerin Perna, PTA 12/20/16 5:09 PM  [place], [place], [address] Phone: [phone]   Fax:  [phone]  Name: Morell Mears MRN: [id number] Date of Birth: [birth year]

## 2016-12-21 ENCOUNTER — Encounter: Payer: Self-pay | Admitting: Physical Therapy

## 2016-12-22 ENCOUNTER — Ambulatory Visit (INDEPENDENT_AMBULATORY_CARE_PROVIDER_SITE_OTHER): Payer: PRIVATE HEALTH INSURANCE | Admitting: Physical Therapy

## 2016-12-22 ENCOUNTER — Telehealth: Payer: Self-pay | Admitting: Physical Therapy

## 2016-12-22 DIAGNOSIS — M25611 Stiffness of right shoulder, not elsewhere classified: Secondary | ICD-10-CM

## 2016-12-22 DIAGNOSIS — M6281 Muscle weakness (generalized): Secondary | ICD-10-CM | POA: Diagnosis not present

## 2016-12-22 NOTE — Therapy (Signed)
[place], [place], [address] Phone: [phone]   Fax:  [phone]  Physical Therapy Treatment  Patient Details  Name: Jim Hunter MRN: [id number] Date of Birth: 1966/07/01 Referring Provider: Dr. Charlotte Sanes  Encounter Date: 12/22/2016      PT End of Session - 12/22/16 1355    Visit Number 40   Number of Visits 46   Date for PT Re-Evaluation 01/11/17   PT Start Time 1345  pt arrived late; thought appt was at 2pm   PT Stop Time 1630   PT Time Calculation (min) 165 min   Activity Tolerance Patient tolerated treatment well   Behavior During Therapy [facility] for tasks assessed/performed      Past Medical History:  Diagnosis Date  . Arthritis   . GERD (gastroesophageal reflux disease)   . Hyperlipidemia   . Hypertension   . 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 Sleep apnea 3 years ago   no borderline no cpap needed per sleep studty done    Past Surgical History:  Procedure Laterality Date  . [place] SURGERY  2006  . ROTATOR CUFF REPAIR  2010   left  . 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   Procedure: RIGHT TOTAL KNEE ARTHROPLASTY;  Surgeon: Paralee Cancel, MD;  Location: [facility];  Service: Orthopedics;  Laterality: Right;  . TYMPANOPLASTY     age 51    There were no vitals filed for this visit.      Subjective Assessment - 12/22/16 1401    Subjective Pt reports his Lt tricep is sore today, but otherwise nothing new to report.  He has been sleeping through night for the last week.    Patient Stated Goals drive and return to work, sleep through the night    Currently in Pain? No/denies   Pain Score 0-No pain            [facility] PT Assessment - 12/22/16 0001      Assessment   Medical Diagnosis Rt RTC Repair   Referring Provider Dr. Harrell Gave Tuohy   Onset Date/Surgical Date 06/16/16   Hand Dominance  Right   Next MD Visit 01/11/17                     [facility] Adult PT Treatment/Exercise - 12/22/16 0001      Exercises   Other Exercises  See Work Conditioning flow sheet.                   PT Short Term Goals - 10/10/16 0932      PT SHORT TERM GOAL #1   Title I with initial HEP ( 09/01/16)    Time 4   Period Weeks   Status Achieved     PT SHORT TERM GOAL #2   Title demo Rt shoulder PROM WFL ( 11/15/16)    Time 12   Period Weeks   Status Achieved     PT SHORT TERM GOAL #3   Title tolerate initial elbow strengthening ( 09/01/16)    Time 4   Period Weeks   Status Achieved     PT SHORT TERM GOAL #4   Title improve FOTO =/< 55% limited ( 11/15/16)    Time 12   Period Weeks   Status Achieved     PT SHORT TERM GOAL #5   Title reduce  pain =/> 50% with daily activity   Time 2   Period Weeks   Status Achieved           PT Long Term Goals - 12/22/16 1613      PT LONG TERM GOAL #1   Title I with advanced HEP ( 01/11/17)    Time 23   Period Weeks   Status On-going     PT LONG TERM GOAL #2   Title Patient demonstrates floor to waist with 50# (01/11/17)   Time 23   Period Weeks   Status On-going  progressing well, lifting ~18#      PT LONG TERM GOAL #3   Title demo Rt shoulder strength =/> 5-/5 ( 01/11/17)    Time 23   Period Weeks   Status Partially Met     PT LONG TERM GOAL #4   Title tolerate lifting with Rt UE per MD orders without increased pain ( 01/11/17)    Time 23   Period Weeks   Status On-going  allowed to lift, has occasional pain, depending on exercise     PT LONG TERM GOAL #5   Title improve FOTO =/< 33% limited (01/11/17)    Time 23   Period Weeks   Status On-going     PT LONG TERM GOAL #6   Title Patient to demonstrate push/pull of 25# of force for return to work tasks (01/11/17)    Time 23   Period Weeks   Status On-going  progressing well               Plan - 12/22/16 1615    Clinical Impression Statement Pt  tolerated all exercises for work conditioning without report of symptoms.  His endurance is improving, as he is able to tolerate longer sessions, including increased reps and time on aerobic equip.  Pt it progressing well towards remaining goals.    Rehab Potential Excellent   PT Frequency 3x / week  progress up to 4-5x/wk per order   PT Treatment/Interventions Moist Heat;Ultrasound;Therapeutic exercise;Dry needling;Taping;Vasopneumatic Device;Manual techniques;Neuromuscular re-education;Cryotherapy;Electrical Stimulation;Iontophoresis 6m/ml Dexamethasone;Patient/family education;Passive range of motion   PT Next Visit Plan Continue work conditioning, progress towards 4 hours.    Consulted and Agree with Plan of Care Patient      Patient will benefit from skilled therapeutic intervention in order to improve the following deficits and impairments:  Postural dysfunction, Decreased strength, Pain, Increased muscle spasms, Impaired UE functional use, Decreased range of motion  Visit Diagnosis: Muscle weakness (generalized)  Stiffness of right shoulder, not elsewhere classified     Problem List Patient Active Problem List   Diagnosis Date Noted  . Healthcare maintenance 11/03/2015  . OSA (obstructive sleep apnea) 11/03/2015  . S/P right TKA 09/29/2014  . S/P knee replacement 09/29/2014  . Alcohol use 07/06/2014  . Osteoarthritis of right knee 05/26/2014  . Erectile dysfunction 05/26/2014  . GERD (gastroesophageal reflux disease) 10/02/2012  . Umbilical hernia 073/42/8768 . Hypertension 07/29/2012  . Hyperlipidemia 07/29/2012  . Obesity (BMI 30-39.9) 07/29/2012   JKerin Perna PTA 12/22/16 4:30 PM  [address] [address]Phone: [phone]  Fax:  [phone] Name: RBernice MullinMRN: [id number] of Birth: [birth year]

## 2016-12-25 ENCOUNTER — Encounter: Payer: PRIVATE HEALTH INSURANCE | Admitting: Physical Therapy

## 2016-12-26 ENCOUNTER — Ambulatory Visit (INDEPENDENT_AMBULATORY_CARE_PROVIDER_SITE_OTHER): Payer: PRIVATE HEALTH INSURANCE | Admitting: Physical Therapy

## 2016-12-26 DIAGNOSIS — M25611 Stiffness of right shoulder, not elsewhere classified: Secondary | ICD-10-CM

## 2016-12-26 DIAGNOSIS — M6281 Muscle weakness (generalized): Secondary | ICD-10-CM | POA: Diagnosis not present

## 2016-12-26 DIAGNOSIS — M25511 Pain in right shoulder: Secondary | ICD-10-CM

## 2016-12-26 NOTE — Therapy (Signed)
[place]  [place], [place], [address] Phone: [phone]   Fax:  [phone]  Physical Therapy Treatment  Patient Details  Name: Jim Hunter MRN: [id number] Date of Birth: 25-Jun-1966 Referring Provider: Dr. Charlotte Sanes  Encounter Date: 12/26/2016      PT End of Session - 12/26/16 1235    Visit Number 41   Number of Visits 29  (12 work conditioning visits approved)   Date for PT Re-Evaluation 01/11/17   PT Start Time 0800   PT Stop Time 1200   PT Time Calculation (min) 240 min   Activity Tolerance Patient tolerated treatment well   Behavior During Therapy [facility] for tasks assessed/performed      Past Medical History:  Diagnosis Date  . Arthritis   . GERD (gastroesophageal reflux disease)   . Hyperlipidemia   . Hypertension   . 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 Sleep apnea 3 years ago   no borderline no cpap needed per sleep studty done    Past Surgical History:  Procedure Laterality Date  . [place] SURGERY  2006  . ROTATOR CUFF REPAIR  2010   left  . 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   Procedure: RIGHT TOTAL KNEE ARTHROPLASTY;  Surgeon: Paralee Cancel, MD;  Location: [facility];  Service: Orthopedics;  Laterality: Right;  . TYMPANOPLASTY     age 15    There were no vitals filed for this visit.      Subjective Assessment - 12/26/16 1239    Subjective Jim Hunter reports he had stomach illness yesterday.  He's feeling better today, but his back is stiff and sore.    Patient Stated Goals drive and return to work, sleep through the night    Currently in Pain? Yes   Pain Score 5    Pain Location Back   Pain Orientation Lower   Pain Descriptors / Indicators Sore   Aggravating Factors  ?   Pain Relieving Factors ?            [facility] PT Assessment - 12/26/16 0001      Assessment   Medical Diagnosis Rt RTC Repair    Referring Provider Dr. Harrell Gave Hunter   Onset Date/Surgical Date 06/16/16   Hand Dominance Right   Next MD Visit 01/11/17          [facility] Adult PT Treatment/Exercise - 12/26/16 0001      Exercises   Other Exercises  See Work Conditioning flow sheet.   found under Media           PT Short Term Goals - 10/10/16 0932      PT SHORT TERM GOAL #1   Title I with initial HEP ( 09/01/16)    Time 4   Period Weeks   Status Achieved     PT SHORT TERM GOAL #2   Title demo Rt shoulder PROM WFL ( 11/15/16)    Time 12   Period Weeks   Status Achieved     PT SHORT TERM GOAL #3   Title tolerate initial elbow strengthening ( 09/01/16)    Time 4   Period Weeks   Status Achieved     PT SHORT TERM GOAL #4   Title improve FOTO =/< 55% limited ( 11/15/16)    Time 12   Period Weeks   Status Achieved     PT SHORT TERM  GOAL #5   Title reduce pain =/> 50% with daily activity   Time 2   Period Weeks   Status Achieved           PT Long Term Goals - 12/22/16 1613      PT LONG TERM GOAL #1   Title I with advanced HEP ( 01/11/17)    Time 23   Period Weeks   Status On-going     PT LONG TERM GOAL #2   Title Patient demonstrates floor to waist with 50# (01/11/17)   Time 23   Period Weeks   Status On-going  progressing well, lifting ~18#      PT LONG TERM GOAL #3   Title demo Rt shoulder strength =/> 5-/5 ( 01/11/17)    Time 23   Period Weeks   Status Partially Met     PT LONG TERM GOAL #4   Title tolerate lifting with Rt UE per MD orders without increased pain ( 01/11/17)    Time 23   Period Weeks   Status On-going  allowed to lift, has occasional pain, depending on exercise     PT LONG TERM GOAL #5   Title improve FOTO =/< 33% limited (01/11/17)    Time 23   Period Weeks   Status On-going     PT LONG TERM GOAL #6   Title Patient to demonstrate push/pull of 25# of force for return to work tasks (01/11/17)    Time 23   Period Weeks   Status On-going  progressing well                Plan - 12/26/16 1245    Clinical Impression Statement Pt tolerated increase in work conditioning of 4 hours, without report of symptoms in shoulder. Pt tolerating increased resistance in UE exercises and box lifts.  Pt progressing towards remaining goals.    Rehab Potential Excellent   PT Frequency 3x / week  working up to 4-5x/wk   PT Treatment/Interventions Moist Heat;Ultrasound;Therapeutic exercise;Dry needling;Taping;Vasopneumatic Device;Manual techniques;Neuromuscular re-education;Cryotherapy;Electrical Stimulation;Iontophoresis 19m/ml Dexamethasone;Patient/family education;Passive range of motion   PT Next Visit Plan Continue work conditioning, at 4 hours.    Consulted and Agree with Plan of Care Patient      Patient will benefit from skilled therapeutic intervention in order to improve the following deficits and impairments:  Postural dysfunction, Decreased strength, Pain, Increased muscle spasms, Impaired UE functional use, Decreased range of motion  Visit Diagnosis: Muscle weakness (generalized)  Stiffness of right shoulder, not elsewhere classified  Acute pain of right shoulder     Problem List Patient Active Problem List   Diagnosis Date Noted  . Healthcare maintenance 11/03/2015  . OSA (obstructive sleep apnea) 11/03/2015  . S/P right TKA 09/29/2014  . S/P knee replacement 09/29/2014  . Alcohol use 07/06/2014  . Osteoarthritis of right knee 05/26/2014  . Erectile dysfunction 05/26/2014  . GERD (gastroesophageal reflux disease) 10/02/2012  . Umbilical hernia 010/93/2355 . Hypertension 07/29/2012  . Hyperlipidemia 07/29/2012  . Obesity (BMI 30-39.9) 07/29/2012   JKerin Perna PTA 12/26/16 12:51 PM  [facility] [address] [address]Phone: [phone]  Fax:  [phone] Name: RDonterrius SantucciMRN: [id number] of Birth: 8Nov 04, 1967

## 2016-12-27 ENCOUNTER — Ambulatory Visit (INDEPENDENT_AMBULATORY_CARE_PROVIDER_SITE_OTHER): Payer: PRIVATE HEALTH INSURANCE | Admitting: Physical Therapy

## 2016-12-27 DIAGNOSIS — M25611 Stiffness of right shoulder, not elsewhere classified: Secondary | ICD-10-CM

## 2016-12-27 DIAGNOSIS — M6281 Muscle weakness (generalized): Secondary | ICD-10-CM

## 2016-12-27 NOTE — Therapy (Signed)
[place], [place], [address] Phone: [phone]   Fax:  [phone]  Physical Therapy Treatment  Patient Details  Name: Jim [place] MRN: [id number] Date of Birth: 11-Sep-1965 Referring Provider: Dr. Charlotte Sanes   Encounter Date: 12/27/2016      PT End of Session - 12/27/16 0928    Visit Number 42   Number of Visits 18  currently approved for 12 work conditioning visits   Date for PT Re-Evaluation 01/11/17   PT Start Time 0800   PT Stop Time 1200   PT Time Calculation (min) 240 min   Activity Tolerance Patient tolerated treatment well   Behavior During Therapy [facility] for tasks assessed/performed      Past Medical History:  Diagnosis Date  . Arthritis   . GERD (gastroesophageal reflux disease)   . Hyperlipidemia   . Hypertension   . 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 Sleep apnea 3 years ago   no borderline no cpap needed per sleep studty done    Past Surgical History:  Procedure Laterality Date  . [place] SURGERY  2006  . ROTATOR CUFF REPAIR  2010   left  . 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   Procedure: RIGHT TOTAL KNEE ARTHROPLASTY;  Surgeon: Paralee Cancel, MD;  Location: [facility];  Service: Orthopedics;  Laterality: Right;  . TYMPANOPLASTY     age 3    There were no vitals filed for this visit.      Subjective Assessment - 12/27/16 0929    Subjective Pt reports he is still point tender to touch of incision of Rt ant shoulder.   Otherwise, he feels things are going well.    Patient Stated Goals drive and return to work, sleep through the night    Currently in Pain? No/denies   Pain Score 0-No pain  just "post work out" soreness in whole body            [facility] PT Assessment - 12/27/16 0001      Assessment   Medical Diagnosis Rt RTC Repair   Referring Provider Dr. Harrell Gave Tuohy    Onset  Date/Surgical Date 06/16/16   Hand Dominance Right   Next MD Visit 01/11/17           [facility] Adult PT Treatment/Exercise - 12/27/16 0001      Exercises   Other Exercises  See Work Conditioning flow sheet.   found under Media          PT Short Term Goals - 10/10/16 0932      PT SHORT TERM GOAL #1   Title I with initial HEP ( 09/01/16)    Time 4   Period Weeks   Status Achieved     PT SHORT TERM GOAL #2   Title demo Rt shoulder PROM WFL ( 11/15/16)    Time 12   Period Weeks   Status Achieved     PT SHORT TERM GOAL #3   Title tolerate initial elbow strengthening ( 09/01/16)    Time 4   Period Weeks   Status Achieved     PT SHORT TERM GOAL #4   Title improve FOTO =/< 55% limited ( 11/15/16)    Time 12   Period Weeks   Status Achieved     PT SHORT TERM GOAL #5   Title reduce pain =/> 50% with daily  activity   Time 2   Period Weeks   Status Achieved           PT Long Term Goals - 12/22/16 1613      PT LONG TERM GOAL #1   Title I with advanced HEP ( 01/11/17)    Time 23   Period Weeks   Status On-going     PT LONG TERM GOAL #2   Title Patient demonstrates floor to waist with 50# (01/11/17)   Time 23   Period Weeks   Status On-going  progressing well, lifting ~18#      PT LONG TERM GOAL #3   Title demo Rt shoulder strength =/> 5-/5 ( 01/11/17)    Time 23   Period Weeks   Status Partially Met     PT LONG TERM GOAL #4   Title tolerate lifting with Rt UE per MD orders without increased pain ( 01/11/17)    Time 23   Period Weeks   Status On-going  allowed to lift, has occasional pain, depending on exercise     PT LONG TERM GOAL #5   Title improve FOTO =/< 33% limited (01/11/17)    Time 23   Period Weeks   Status On-going     PT LONG TERM GOAL #6   Title Patient to demonstrate push/pull of 25# of force for return to work tasks (01/11/17)    Time 23   Period Weeks   Status On-going  progressing well               Plan - 12/27/16 1510     Rehab Potential Excellent   PT Frequency 3x / week  working up to 4-5/wk   PT Treatment/Interventions Moist Heat;Ultrasound;Therapeutic exercise;Dry needling;Taping;Vasopneumatic Device;Manual techniques;Neuromuscular re-education;Cryotherapy;Electrical Stimulation;Iontophoresis 72m/ml Dexamethasone;Patient/family education;Passive range of motion   PT Next Visit Plan Continue work conditioning, at 4 hours. Assess shoulder strength.    Consulted and Agree with Plan of Care Patient      Patient will benefit from skilled therapeutic intervention in order to improve the following deficits and impairments:  Postural dysfunction, Decreased strength, Pain, Increased muscle spasms, Impaired UE functional use, Decreased range of motion  Visit Diagnosis: Muscle weakness (generalized)  Stiffness of right shoulder, not elsewhere classified     Problem List Patient Active Problem List   Diagnosis Date Noted  . Healthcare maintenance 11/03/2015  . OSA (obstructive sleep apnea) 11/03/2015  . S/P right TKA 09/29/2014  . S/P knee replacement 09/29/2014  . Alcohol use 07/06/2014  . Osteoarthritis of right knee 05/26/2014  . Erectile dysfunction 05/26/2014  . GERD (gastroesophageal reflux disease) 10/02/2012  . Umbilical hernia 038/18/2993 . Hypertension 07/29/2012  . Hyperlipidemia 07/29/2012  . Obesity (BMI 30-39.9) 07/29/2012   JKerin Perna PTA 12/27/16 3:13 PM  [facility] [place] [address] [address]Phone: [phone]  Fax:  [phone] Name: Jim AbdouMRN: [id number] of Birth: 8Feb 22, 1967

## 2016-12-29 ENCOUNTER — Ambulatory Visit (INDEPENDENT_AMBULATORY_CARE_PROVIDER_SITE_OTHER): Payer: PRIVATE HEALTH INSURANCE | Admitting: Physical Therapy

## 2016-12-29 DIAGNOSIS — M6281 Muscle weakness (generalized): Secondary | ICD-10-CM

## 2016-12-29 DIAGNOSIS — M25511 Pain in right shoulder: Secondary | ICD-10-CM

## 2016-12-29 DIAGNOSIS — M25611 Stiffness of right shoulder, not elsewhere classified: Secondary | ICD-10-CM

## 2016-12-29 NOTE — Therapy (Signed)
[place], [place], [address] Phone: [phone]   Fax:  [phone]  Physical Therapy Treatment  Patient Details  Name: Jim [place] MRN: [id number] Date of Birth: [birth year] Referring Provider: Dr. Charleston Ropes  Encounter Date: 12/29/2016      PT End of Session - 12/29/16 1121    Visit Number 43   Number of Visits 46   Date for PT Re-Evaluation 01/11/17   PT Start Time 2376   PT Stop Time 1155   PT Time Calculation (min) 240 min   Activity Tolerance Patient tolerated treatment well   Behavior During Therapy [name] for tasks assessed/performed      Past Medical History:  Diagnosis Date  . Arthritis   . GERD (gastroesophageal reflux disease)   . Hyperlipidemia   . Hypertension   . 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 Sleep apnea 3 years ago   no borderline no cpap needed per sleep studty done    Past Surgical History:  Procedure Laterality Date  . [place] SURGERY  2006  . ROTATOR CUFF REPAIR  2010   left  . 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   Procedure: RIGHT TOTAL KNEE ARTHROPLASTY;  Surgeon: Paralee Cancel, MD;  Location: [facility];  Service: Orthopedics;  Laterality: Right;  . TYMPANOPLASTY     age 1    There were no vitals filed for this visit.      Subjective Assessment - 12/29/16 1119    Subjective Pt did not attend therapy due to wife's surgery.  He said he noticed he was able to give her support while walking up stairs without much difficulty.  His only complaint is some stiffness behind bilat knees from new exercises.    Patient Stated Goals drive and return to work, sleep through the night    Currently in Pain? No/denies   Pain Score 0-No pain            [facility] PT Assessment - 12/29/16 0001      Assessment   Medical Diagnosis Rt RTC Repair   Referring Provider Dr. Gerald Stabs Tuohy   Onset  Date/Surgical Date 06/16/16   Hand Dominance Right   Next MD Visit 01/11/17     Strength   Right Shoulder Flexion --  painful, 5-/5   Right Shoulder Extension 5/5   Right Shoulder ABduction 4/5  painful   Right Shoulder Internal Rotation 4+/5   Right Shoulder External Rotation --  5-/5   Right Hand Grip (lbs) 108   Left Hand Grip (lbs) 110          [facility] Adult PT Treatment/Exercise - 12/29/16 0001      Exercises   Other Exercises  See Work Conditioning flow sheet.   found under Media           PT Short Term Goals - 10/10/16 0932      PT SHORT TERM GOAL #1   Title I with initial HEP ( 09/01/16)    Time 4   Period Weeks   Status Achieved     PT SHORT TERM GOAL #2   Title demo Rt shoulder PROM WFL ( 11/15/16)    Time 12   Period Weeks   Status Achieved     PT SHORT TERM GOAL #3   Title tolerate initial elbow strengthening ( 09/01/16)    Time 4  Period Weeks   Status Achieved     PT SHORT TERM GOAL #4   Title improve FOTO =/< 55% limited ( 11/15/16)    Time 12   Period Weeks   Status Achieved     PT SHORT TERM GOAL #5   Title reduce pain =/> 50% with daily activity   Time 2   Period Weeks   Status Achieved           PT Long Term Goals - 12/22/16 1613      PT LONG TERM GOAL #1   Title I with advanced HEP ( 01/11/17)    Time 23   Period Weeks   Status On-going     PT LONG TERM GOAL #2   Title Patient demonstrates floor to waist with 50# (01/11/17)   Time 23   Period Weeks   Status On-going  progressing well, lifting ~18#      PT LONG TERM GOAL #3   Title demo Rt shoulder strength =/> 5-/5 ( 01/11/17)    Time 23   Period Weeks   Status Partially Met     PT LONG TERM GOAL #4   Title tolerate lifting with Rt UE per MD orders without increased pain ( 01/11/17)    Time 23   Period Weeks   Status On-going  allowed to lift, has occasional pain, depending on exercise     PT LONG TERM GOAL #5   Title improve FOTO =/< 33% limited (01/11/17)     Time 23   Period Weeks   Status On-going     PT LONG TERM GOAL #6   Title Patient to demonstrate push/pull of 25# of force for return to work tasks (01/11/17)    Time 23   Period Weeks   Status On-going  progressing well               Plan - 12/29/16 1125    Clinical Impression Statement Pt continues to have some weakness in Rt shoulder flexion, abduction and IR.  He is tolerating work conditioning for 4 hours.  Progressing towards remaining goals.    Rehab Potential Excellent   PT Frequency 3x / week  progressing to 4x/wk   PT Treatment/Interventions Moist Heat;Ultrasound;Therapeutic exercise;Dry needling;Taping;Vasopneumatic Device;Manual techniques;Neuromuscular re-education;Cryotherapy;Electrical Stimulation;Iontophoresis 59m/ml Dexamethasone;Patient/family education;Passive range of motion   PT Next Visit Plan Continue work conditioning, at 4 hours.       Patient will benefit from skilled therapeutic intervention in order to improve the following deficits and impairments:  Postural dysfunction, Decreased strength, Pain, Increased muscle spasms, Impaired UE functional use, Decreased range of motion  Visit Diagnosis: Muscle weakness (generalized)  Stiffness of right shoulder, not elsewhere classified  Acute pain of right shoulder     Problem List Patient Active Problem List   Diagnosis Date Noted  . Healthcare maintenance 11/03/2015  . OSA (obstructive sleep apnea) 11/03/2015  . S/P right TKA 09/29/2014  . S/P knee replacement 09/29/2014  . Alcohol use 07/06/2014  . Osteoarthritis of right knee 05/26/2014  . Erectile dysfunction 05/26/2014  . GERD (gastroesophageal reflux disease) 10/02/2012  . Umbilical hernia 023/53/6144 . Hypertension 07/29/2012  . Hyperlipidemia 07/29/2012  . Obesity (BMI 30-39.9) 07/29/2012   JKerin Perna PTA 12/29/16 11:30 AM  [address] [address]Phone: [phone]  Fax:  [phone] Name: Jim CedroneMRN: [id number] of Birth: 809-28-1967

## 2017-01-01 ENCOUNTER — Encounter: Payer: Self-pay | Admitting: Rehabilitative and Restorative Service Providers"

## 2017-01-02 ENCOUNTER — Ambulatory Visit (INDEPENDENT_AMBULATORY_CARE_PROVIDER_SITE_OTHER): Payer: PRIVATE HEALTH INSURANCE | Admitting: Physical Therapy

## 2017-01-02 DIAGNOSIS — M6281 Muscle weakness (generalized): Secondary | ICD-10-CM

## 2017-01-02 DIAGNOSIS — M25611 Stiffness of right shoulder, not elsewhere classified: Secondary | ICD-10-CM

## 2017-01-02 NOTE — Therapy (Signed)
[place], [place], [address] Phone: [phone]   Fax:  [phone]  Physical Therapy Treatment  Patient Details  Name: Jim Hunter MRN: [id number] Date of Birth: 1965/09/20 Referring Provider: Dr. Charlotte Sanes  Encounter Date: 01/02/2017      PT End of Session - 01/02/17 0814    Visit Number 44   Number of Visits 46   Date for PT Re-Evaluation 01/11/17   PT Start Time 0800   PT Stop Time 1200   PT Time Calculation (min) 240 min      Past Medical History:  Diagnosis Date  . Arthritis   . GERD (gastroesophageal reflux disease)   . Hyperlipidemia   . Hypertension   . 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 Sleep apnea 3 years ago   no borderline no cpap needed per sleep studty done    Past Surgical History:  Procedure Laterality Date  . [place] SURGERY  2006  . ROTATOR CUFF REPAIR  2010   left  . 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   Procedure: RIGHT TOTAL KNEE ARTHROPLASTY;  Surgeon: Paralee Cancel, MD;  Location: [facility];  Service: Orthopedics;  Laterality: Right;  . TYMPANOPLASTY     age 51    There were no vitals filed for this visit.      Subjective Assessment - 01/02/17 0809    Subjective Jim Hunter reported things with his Rt shoulder are going well.  He is still point tender in incision right over the Rt bicep tendon, but otherwise no issues.     Patient Stated Goals drive and return to work, sleep through the night    Currently in Pain? No/denies   Pain Score 0-No pain            [facility] PT Assessment - 01/02/17 0001      Assessment   Medical Diagnosis Rt RTC Repair   Referring Provider Dr. Charlotte Sanes   Onset Date/Surgical Date 06/16/16   Hand Dominance Right   Next MD Visit 01/11/17            [facility] Adult PT Treatment/Exercise - 01/02/17 0001      Exercises   Other Exercises   See Work Conditioning flow sheet.   found under Media                  PT Short Term Goals - 10/10/16 0932      PT SHORT TERM GOAL #1   Title I with initial HEP ( 09/01/16)    Time 4   Period Weeks   Status Achieved     PT SHORT TERM GOAL #2   Title demo Rt shoulder PROM WFL ( 11/15/16)    Time 12   Period Weeks   Status Achieved     PT SHORT TERM GOAL #3   Title tolerate initial elbow strengthening ( 09/01/16)    Time 4   Period Weeks   Status Achieved     PT SHORT TERM GOAL #4   Title improve FOTO =/< 55% limited ( 11/15/16)    Time 12   Period Weeks   Status Achieved     PT SHORT TERM GOAL #5   Title reduce pain =/> 50% with daily activity   Time 2   Period Weeks   Status Achieved  PT Long Term Goals - 12/22/16 1613      PT LONG TERM GOAL #1   Title I with advanced HEP ( 01/11/17)    Time 23   Period Weeks   Status On-going     PT LONG TERM GOAL #2   Title Patient demonstrates floor to waist with 50# (01/11/17)   Time 23   Period Weeks   Status On-going  progressing well, lifting ~18#      PT LONG TERM GOAL #3   Title demo Rt shoulder strength =/> 5-/5 ( 01/11/17)    Time 23   Period Weeks   Status Partially Met     PT LONG TERM GOAL #4   Title tolerate lifting with Rt UE per MD orders without increased pain ( 01/11/17)    Time 23   Period Weeks   Status On-going  allowed to lift, has occasional pain, depending on exercise     PT LONG TERM GOAL #5   Title improve FOTO =/< 33% limited (01/11/17)    Time 23   Period Weeks   Status On-going     PT LONG TERM GOAL #6   Title Patient to demonstrate push/pull of 25# of force for return to work tasks (01/11/17)    Time 23   Period Weeks   Status On-going  progressing well               Plan - 01/02/17 1333    Rehab Potential Excellent   PT Frequency 4x / week   PT Treatment/Interventions Moist Heat;Ultrasound;Therapeutic exercise;Dry needling;Taping;Vasopneumatic  Device;Manual techniques;Neuromuscular re-education;Cryotherapy;Electrical Stimulation;Iontophoresis 21m/ml Dexamethasone;Patient/family education;Passive range of motion   PT Next Visit Plan Continue work conditioning, at 4 hours.    Consulted and Agree with Plan of Care Patient      Patient will benefit from skilled therapeutic intervention in order to improve the following deficits and impairments:  Postural dysfunction, Decreased strength, Pain, Increased muscle spasms, Impaired UE functional use, Decreased range of motion  Visit Diagnosis: Muscle weakness (generalized)  Stiffness of right shoulder, not elsewhere classified     Problem List Patient Active Problem List   Diagnosis Date Noted  . Healthcare maintenance 11/03/2015  . OSA (obstructive sleep apnea) 11/03/2015  . S/P right TKA 09/29/2014  . S/P knee replacement 09/29/2014  . Alcohol use 07/06/2014  . Osteoarthritis of right knee 05/26/2014  . Erectile dysfunction 05/26/2014  . GERD (gastroesophageal reflux disease) 10/02/2012  . Umbilical hernia 010/31/2811 . Hypertension 07/29/2012  . Hyperlipidemia 07/29/2012  . Obesity (BMI 30-39.9) 07/29/2012   JKerin Perna PTA 01/02/17 1:34 PM  [place] [address]Phone: [phone]  Fax:  [phone] Name: Jim BacallaoMRN: [id number] of Birth: 8Aug 10, 1967

## 2017-01-03 ENCOUNTER — Ambulatory Visit (INDEPENDENT_AMBULATORY_CARE_PROVIDER_SITE_OTHER): Payer: PRIVATE HEALTH INSURANCE | Admitting: Physical Therapy

## 2017-01-03 DIAGNOSIS — M25611 Stiffness of right shoulder, not elsewhere classified: Secondary | ICD-10-CM

## 2017-01-03 DIAGNOSIS — M6281 Muscle weakness (generalized): Secondary | ICD-10-CM

## 2017-01-03 NOTE — Therapy (Signed)
[place], [place], [address] Phone: [phone]   Fax:  [phone]  Physical Therapy Treatment  Patient Details  Name: Jim Hunter MRN: [id number] Date of Birth: 1966/05/09 Referring Provider: Dr. Charlotte Sanes  Encounter Date: 01/03/2017      PT End of Session - 01/03/17 1135    Visit Number 45   Number of Visits 46   Date for PT Re-Evaluation 01/11/17   PT Start Time 0745   PT Stop Time 2376   PT Time Calculation (min) 240 min      Past Medical History:  Diagnosis Date  . Arthritis   . GERD (gastroesophageal reflux disease)   . Hyperlipidemia   . Hypertension   . 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 Sleep apnea 3 years ago   no borderline no cpap needed per sleep studty done    Past Surgical History:  Procedure Laterality Date  . [place] SURGERY  2006  . ROTATOR CUFF REPAIR  2010   left  . 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   Procedure: RIGHT TOTAL KNEE ARTHROPLASTY;  Surgeon: Paralee Cancel, MD;  Location: [facility];  Service: Orthopedics;  Laterality: Right;  . TYMPANOPLASTY     age 51    There were no vitals filed for this visit.      Subjective Assessment - 01/02/17 0809    Subjective Evertt reports no new changes.    Patient Stated Goals drive and return to work, sleep through the night    Currently in Pain? No/denies   Pain Score 0-No pain            [facility] PT Assessment - 01/03/17 0001      Assessment   Medical Diagnosis Rt RTC Repair   Referring Provider Dr. Harrell Gave Tuohy   Onset Date/Surgical Date 06/16/16   Hand Dominance Right   Next MD Visit 01/11/17          [facility] Adult PT Treatment/Exercise - 01/03/17 0001      Exercises   Other Exercises  See Work Conditioning flow sheet.   found under Media          PT Short Term Goals - 10/10/16 0932      PT SHORT TERM GOAL  #1   Title I with initial HEP ( 09/01/16)    Time 4   Period Weeks   Status Achieved     PT SHORT TERM GOAL #2   Title demo Rt shoulder PROM WFL ( 11/15/16)    Time 12   Period Weeks   Status Achieved     PT SHORT TERM GOAL #3   Title tolerate initial elbow strengthening ( 09/01/16)    Time 4   Period Weeks   Status Achieved     PT SHORT TERM GOAL #4   Title improve FOTO =/< 55% limited ( 11/15/16)    Time 12   Period Weeks   Status Achieved     PT SHORT TERM GOAL #5   Title reduce pain =/> 50% with daily activity   Time 2   Period Weeks   Status Achieved           PT Long Term Goals - 01/03/17 1236      PT LONG TERM GOAL #1   Title I with advanced HEP ( 01/11/17)    Time 23  Period Weeks   Status On-going     PT LONG TERM GOAL #2   Title Patient demonstrates floor to waist with 50# (01/11/17)   Time 23   Period Weeks   Status On-going     PT LONG TERM GOAL #3   Title demo Rt shoulder strength =/> 5-/5 ( 01/11/17)    Time 23   Period Weeks   Status Partially Met     PT LONG TERM GOAL #4   Title tolerate lifting with Rt UE per MD orders without increased pain ( 01/11/17)    Time 23   Period Weeks   Status On-going     PT LONG TERM GOAL #5   Title improve FOTO =/< 33% limited (01/11/17)    Time 23   Period Weeks   Status On-going     PT LONG TERM GOAL #6   Title Patient to demonstrate push/pull of 25# of force for return to work tasks (01/11/17)    Time 23   Period Weeks   Status Achieved               Plan - 01/03/17 1228    Clinical Impression Statement Pt able to tolerate sled push/pull with 25# of force, as well as box lift/carry of 30# without any increased symptoms. Pt has met LTG#6.     Rehab Potential Excellent   PT Frequency 4x / week   PT Treatment/Interventions Moist Heat;Ultrasound;Therapeutic exercise;Dry needling;Taping;Vasopneumatic Device;Manual techniques;Neuromuscular re-education;Cryotherapy;Electrical  Stimulation;Iontophoresis 44m/ml Dexamethasone;Patient/family education;Passive range of motion   PT Next Visit Plan Continue work conditioning, at 4 hours.    Consulted and Agree with Plan of Care Patient      Patient will benefit from skilled therapeutic intervention in order to improve the following deficits and impairments:  Postural dysfunction, Decreased strength, Pain, Increased muscle spasms, Impaired UE functional use, Decreased range of motion  Visit Diagnosis: Muscle weakness (generalized)  Stiffness of right shoulder, not elsewhere classified     Problem List Patient Active Problem List   Diagnosis Date Noted  . Healthcare maintenance 11/03/2015  . OSA (obstructive sleep apnea) 11/03/2015  . S/P right TKA 09/29/2014  . S/P knee replacement 09/29/2014  . Alcohol use 07/06/2014  . Osteoarthritis of right knee 05/26/2014  . Erectile dysfunction 05/26/2014  . GERD (gastroesophageal reflux disease) 10/02/2012  . Umbilical hernia 091/66/0600 . Hypertension 07/29/2012  . Hyperlipidemia 07/29/2012  . Obesity (BMI 30-39.9) 07/29/2012   JKerin Perna PTA 01/03/17 12:37 PM  [place] [address]Phone: [phone]  Fax:  [phone] Name: Jim MossbergMRN: [id number] of Birth: 811-Apr-1967

## 2017-01-04 ENCOUNTER — Ambulatory Visit (INDEPENDENT_AMBULATORY_CARE_PROVIDER_SITE_OTHER): Payer: PRIVATE HEALTH INSURANCE | Admitting: Physical Therapy

## 2017-01-04 DIAGNOSIS — M6281 Muscle weakness (generalized): Secondary | ICD-10-CM

## 2017-01-04 DIAGNOSIS — M25611 Stiffness of right shoulder, not elsewhere classified: Secondary | ICD-10-CM

## 2017-01-04 NOTE — Therapy (Addendum)
[place], [place], [address] Phone: [phone]   Fax:  [phone]  Physical Therapy Treatment  Patient Details  Name: Jim Hunter MRN: [id number] Date of Birth: 1966-06-06 Referring Provider: Dr. Charlotte Sanes   Encounter Date: 01/04/2017      PT End of Session - 01/04/17 1006    Visit Number 71   Number of Visits 51   Date for PT Re-Evaluation 01/12/17   PT Start Time 0800   PT Stop Time 1200   PT Time Calculation (min) 240 min   Activity Tolerance Patient tolerated treatment well   Behavior During Therapy [facility] for tasks assessed/performed      Past Medical History:  Diagnosis Date  . Arthritis   . GERD (gastroesophageal reflux disease)   . Hyperlipidemia   . Hypertension   . 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 Sleep apnea 3 years ago   no borderline no cpap needed per sleep studty done    Past Surgical History:  Procedure Laterality Date  . [place] SURGERY  2006  . ROTATOR CUFF REPAIR  2010   left  . 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   Procedure: RIGHT TOTAL KNEE ARTHROPLASTY;  Surgeon: Paralee Cancel, MD;  Location: [facility];  Service: Orthopedics;  Laterality: Right;  . TYMPANOPLASTY     age 51    There were no vitals filed for this visit.          [facility] PT Assessment - 01/04/17 0001      Assessment   Medical Diagnosis Rt RTC Repair   Referring Provider Dr. Harrell Gave Tuohy    Onset Date/Surgical Date 06/16/16   Hand Dominance Right   Next MD Visit 01/11/17     Observation/Other Assessments   Focus on Therapeutic Outcomes (FOTO)  33% limited      Strength   Right/Left Shoulder Right   Right Shoulder Flexion --  5-/5   Right Shoulder Extension 5/5   Right Shoulder ABduction 4+/5   Right Shoulder Internal Rotation --  5-/5   Right Shoulder External Rotation 4+/5          [facility]  Adult PT Treatment/Exercise - 01/04/17 0001      Exercises   Other Exercises  See Work Conditioning flow sheet.   found under Media           PT Short Term Goals - 10/10/16 0932      PT SHORT TERM GOAL #1   Title I with initial HEP ( 09/01/16)    Time 4   Period Weeks   Status Achieved     PT SHORT TERM GOAL #2   Title demo Rt shoulder PROM WFL ( 11/15/16)    Time 12   Period Weeks   Status Achieved     PT SHORT TERM GOAL #3   Title tolerate initial elbow strengthening ( 09/01/16)    Time 4   Period Weeks   Status Achieved     PT SHORT TERM GOAL #4   Title improve FOTO =/< 55% limited ( 11/15/16)    Time 12   Period Weeks   Status Achieved     PT SHORT TERM GOAL #5   Title reduce pain =/> 50% with daily activity   Time 2   Period Weeks   Status Achieved  PT Long Term Goals - 01/03/17 1236      PT LONG TERM GOAL #1   Title I with advanced HEP ( 01/11/17)    Time 23   Period Weeks   Status On-going     PT LONG TERM GOAL #2   Title Patient demonstrates floor to waist with 50# (01/11/17)   Time 23   Period Weeks   Status On-going     PT LONG TERM GOAL #3   Title demo Rt shoulder strength =/> 5-/5 ( 01/11/17)    Time 23   Period Weeks   Status Partially Met     PT LONG TERM GOAL #4   Title tolerate lifting with Rt UE per MD orders without increased pain ( 01/11/17)    Time 23   Period Weeks   Status On-going     PT LONG TERM GOAL #5   Title improve FOTO =/< 33% limited (01/11/17)    Time 23   Period Weeks   Status Achieved     PT LONG TERM GOAL #6   Title Patient to demonstrate push/pull of 25# of force for return to work tasks (01/11/17)    Time 23   Period Weeks   Status Achieved               Plan - 01/04/17 1238    Clinical Impression Statement Pt continues to have sensitivity to touch of the incision area near Rt biceps tendon.  He is tolerating work conditioning well.  His FOTO score improved to 33% limited; has met his  FOTO goal.  Strength in Rt shoulder improving; continues with weakness in Rt shoulder Abdct and ER.  Progressing towards remaining goals. Renewal sent to cover his last week of work conditioning.    Rehab Potential Excellent   PT Frequency 4x / week   PT Treatment/Interventions Moist Heat;Ultrasound;Therapeutic exercise;Dry needling;Taping;Vasopneumatic Device;Manual techniques;Neuromuscular re-education;Cryotherapy;Electrical Stimulation;Iontophoresis 73m/ml Dexamethasone;Patient/family education;Passive range of motion   PT Next Visit Plan Continue work conditioning, at 4 hours.    Consulted and Agree with Plan of Care Patient      Patient will benefit from skilled therapeutic intervention in order to improve the following deficits and impairments:  Postural dysfunction, Decreased strength, Pain, Increased muscle spasms, Impaired UE functional use, Decreased range of motion  Visit Diagnosis: Muscle weakness (generalized) - Plan: PT plan of care cert/re-cert  Stiffness of right shoulder, not elsewhere classified - Plan: PT plan of care cert/re-cert     Problem List Patient Active Problem List   Diagnosis Date Noted  . Healthcare maintenance 11/03/2015  . OSA (obstructive sleep apnea) 11/03/2015  . S/P right TKA 09/29/2014  . S/P knee replacement 09/29/2014  . Alcohol use 07/06/2014  . Osteoarthritis of right knee 05/26/2014  . Erectile dysfunction 05/26/2014  . GERD (gastroesophageal reflux disease) 10/02/2012  . Umbilical hernia 074/82/7078 . Hypertension 07/29/2012  . Hyperlipidemia 07/29/2012  . Obesity (BMI 30-39.9) 07/29/2012   JKerin Perna PTA 01/04/17 52:75PM    Renewal certification sent to MD to cover his last week of work conditioning SJeral Pinch PT 01/04/17 3:06 [address] [address]Phone: [phone]  Fax:  [phone] Name: Jim GerdemanMRN:  [id number] of Birth: [birth year]

## 2017-01-04 NOTE — Addendum Note (Signed)
Addended by: Keng Jewel E on: 01/04/2017 03:06 PM   Modules accepted: Orders

## 2017-01-05 ENCOUNTER — Encounter: Payer: Self-pay | Admitting: Obstetrics and Gynecology

## 2017-01-05 ENCOUNTER — Ambulatory Visit (INDEPENDENT_AMBULATORY_CARE_PROVIDER_SITE_OTHER): Payer: PRIVATE HEALTH INSURANCE | Admitting: Physical Therapy

## 2017-01-05 DIAGNOSIS — M25611 Stiffness of right shoulder, not elsewhere classified: Secondary | ICD-10-CM

## 2017-01-05 DIAGNOSIS — M6281 Muscle weakness (generalized): Secondary | ICD-10-CM

## 2017-01-05 NOTE — Therapy (Signed)
[place], [place], [address] Phone: [phone]   Fax:  [phone]  Physical Therapy Treatment  Patient Details  Name: Jim Hunter MRN: [id number] Date of Birth: [birth year] Referring Provider: Dr. Charlotte Sanes  Encounter Date: 01/05/2017      PT End of Session - 01/05/17 1704    Visit Number 51   Number of Visits 51   Date for PT Re-Evaluation 01/12/17   PT Start Time 1025   PT Stop Time 1425   PT Time Calculation (min) 240 min   Activity Tolerance Patient tolerated treatment well   Behavior During Therapy [facility] for tasks assessed/performed      Past Medical History:  Diagnosis Date  . Arthritis   . GERD (gastroesophageal reflux disease)   . Hyperlipidemia   . Hypertension   . 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 Sleep apnea 3 years ago   no borderline no cpap needed per sleep studty done    Past Surgical History:  Procedure Laterality Date  . [place] SURGERY  2006  . ROTATOR CUFF REPAIR  2010   left  . 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   Procedure: RIGHT TOTAL KNEE ARTHROPLASTY;  Surgeon: Paralee Cancel, MD;  Location: [facility];  Service: Orthopedics;  Laterality: Right;  . TYMPANOPLASTY     age 51    There were no vitals filed for this visit.      Subjective Assessment - 01/05/17 1705    Subjective pt reports no new changes since last visit. He is anxious to return back to full time work.    Patient Stated Goals drive and return to work, sleep through the night    Currently in Pain? No/denies   Pain Score 0-No pain            [facility] PT Assessment - 01/05/17 0001      Assessment   Medical Diagnosis Rt RTC Repair   Referring Provider Dr. Harrell Gave Tuohy   Onset Date/Surgical Date 06/16/16   Hand Dominance Right   Next MD Visit 01/11/17           [facility] Adult PT Treatment/Exercise -  01/05/17 0001      Exercises   Other Exercises  See Work Conditioning flow sheet.   found under Media           PT Short Term Goals - 10/10/16 0932      PT SHORT TERM GOAL #1   Title I with initial HEP ( 09/01/16)    Time 4   Period Weeks   Status Achieved     PT SHORT TERM GOAL #2   Title demo Rt shoulder PROM WFL ( 11/15/16)    Time 12   Period Weeks   Status Achieved     PT SHORT TERM GOAL #3   Title tolerate initial elbow strengthening ( 09/01/16)    Time 4   Period Weeks   Status Achieved     PT SHORT TERM GOAL #4   Title improve FOTO =/< 55% limited ( 11/15/16)    Time 12   Period Weeks   Status Achieved     PT SHORT TERM GOAL #5   Title reduce pain =/> 50% with daily activity   Time 2   Period Weeks   Status Achieved           PT  Long Term Goals - 01/04/17 1501      PT LONG TERM GOAL #1   Title I with advanced HEP ( 01/12/17)    Time 1   Period Weeks   Status On-going     PT LONG TERM GOAL #2   Title Patient demonstrates floor to waist with 50# (01/12/17)   Time 1   Period Weeks   Status On-going     PT LONG TERM GOAL #3   Title demo Rt shoulder strength =/> 5-/5 ( 01/12/17)    Time 1   Period Weeks   Status Partially Met     PT LONG TERM GOAL #4   Title tolerate lifting with Rt UE per MD orders without increased pain ( 01/12/17)    Time 1   Period Weeks   Status Partially Met     PT LONG TERM GOAL #5   Title improve FOTO =/< 33% limited (01/11/17)    Status Achieved  scored 33% limited today     PT LONG TERM GOAL #6   Title Patient to demonstrate push/pull of 25# of force for return to work tasks (01/11/17)    Status Achieved               Plan - 01/05/17 1706    Rehab Potential Excellent   PT Frequency 4x / week   PT Treatment/Interventions Moist Heat;Ultrasound;Therapeutic exercise;Dry needling;Taping;Vasopneumatic Device;Manual techniques;Neuromuscular re-education;Cryotherapy;Electrical Stimulation;Iontophoresis 49m/ml  Dexamethasone;Patient/family education;Passive range of motion   PT Next Visit Plan Continue work conditioning, at 4 hours.   MD visit on 6/28.    Consulted and Agree with Plan of Care Patient      Patient will benefit from skilled therapeutic intervention in order to improve the following deficits and impairments:  Postural dysfunction, Decreased strength, Pain, Increased muscle spasms, Impaired UE functional use, Decreased range of motion  Visit Diagnosis: Muscle weakness (generalized)  Stiffness of right shoulder, not elsewhere classified     Problem List Patient Active Problem List   Diagnosis Date Noted  . Healthcare maintenance 11/03/2015  . OSA (obstructive sleep apnea) 11/03/2015  . S/P right TKA 09/29/2014  . S/P knee replacement 09/29/2014  . Alcohol use 07/06/2014  . Osteoarthritis of right knee 05/26/2014  . Erectile dysfunction 05/26/2014  . GERD (gastroesophageal reflux disease) 10/02/2012  . Umbilical hernia 074/25/9563 . Hypertension 07/29/2012  . Hyperlipidemia 07/29/2012  . Obesity (BMI 30-39.9) 07/29/2012   JKerin Perna PTA 01/05/17 5:08 PM  [address] [address]Phone: [phone]  Fax:  [phone] Name: Jim GinyardMRN: [id number] of Birth: [birth year]

## 2017-01-08 ENCOUNTER — Encounter: Payer: Self-pay | Admitting: Physical Therapy

## 2017-01-08 ENCOUNTER — Ambulatory Visit (INDEPENDENT_AMBULATORY_CARE_PROVIDER_SITE_OTHER): Payer: PRIVATE HEALTH INSURANCE | Admitting: Physical Therapy

## 2017-01-08 DIAGNOSIS — M25611 Stiffness of right shoulder, not elsewhere classified: Secondary | ICD-10-CM | POA: Diagnosis not present

## 2017-01-08 DIAGNOSIS — M6281 Muscle weakness (generalized): Secondary | ICD-10-CM | POA: Diagnosis not present

## 2017-01-08 NOTE — Therapy (Signed)
[place], [place], [address] Phone: [phone]   Fax:  [phone]  Physical Therapy Treatment  Patient Details  Name: Jim Hunter MRN: [id number] Date of Birth: 1966-05-31 Referring Provider: Dr. Charlotte Sanes  Encounter Date: 01/08/2017      PT End of Session - 01/08/17 0958    Visit Number 48   Number of Visits 51   Date for PT Re-Evaluation 01/12/17   PT Start Time 0837   PT Stop Time 1237   PT Time Calculation (min) 240 min      Past Medical History:  Diagnosis Date  . Arthritis   . GERD (gastroesophageal reflux disease)   . Hyperlipidemia   . Hypertension   . 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 Sleep apnea 3 years ago   no borderline no cpap needed per sleep studty done    Past Surgical History:  Procedure Laterality Date  . [place] SURGERY  2006  . ROTATOR CUFF REPAIR  2010   left  . 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   Procedure: RIGHT TOTAL KNEE ARTHROPLASTY;  Surgeon: Paralee Cancel, MD;  Location: [facility];  Service: Orthopedics;  Laterality: Right;  . TYMPANOPLASTY     age 51    There were no vitals filed for this visit.      Subjective Assessment - 01/08/17 0958    Subjective Sees MD on Thursday and is hopeful to return to work after that   Patient Stated Goals drive and return to work, sleep through the night    Currently in Pain? No/denies                         [facility] Adult PT Treatment/Exercise - 01/08/17 0001      Exercises   Other Exercises  See Work Conditioning flow sheet.   under media                  PT Short Term Goals - 10/10/16 0932      PT SHORT TERM GOAL #1   Title I with initial HEP ( 09/01/16)    Time 4   Period Weeks   Status Achieved     PT SHORT TERM GOAL #2   Title demo Rt shoulder PROM WFL ( 11/15/16)    Time 12   Period  Weeks   Status Achieved     PT SHORT TERM GOAL #3   Title tolerate initial elbow strengthening ( 09/01/16)    Time 4   Period Weeks   Status Achieved     PT SHORT TERM GOAL #4   Title improve FOTO =/< 55% limited ( 11/15/16)    Time 12   Period Weeks   Status Achieved     PT SHORT TERM GOAL #5   Title reduce pain =/> 50% with daily activity   Time 2   Period Weeks   Status Achieved           PT Long Term Goals - 01/04/17 1501      PT LONG TERM GOAL #1   Title I with advanced HEP ( 01/12/17)    Time 1   Period Weeks   Status On-going     PT LONG TERM GOAL #2   Title Patient demonstrates floor to waist with 50# (01/12/17)   Time 1  Period Weeks   Status On-going     PT LONG TERM GOAL #3   Title demo Rt shoulder strength =/> 5-/5 ( 01/12/17)    Time 1   Period Weeks   Status Partially Met     PT LONG TERM GOAL #4   Title tolerate lifting with Rt UE per MD orders without increased pain ( 01/12/17)    Time 1   Period Weeks   Status Partially Met     PT LONG TERM GOAL #5   Title improve FOTO =/< 33% limited (01/11/17)    Status Achieved  scored 33% limited today     PT LONG TERM GOAL #6   Title Patient to demonstrate push/pull of 25# of force for return to work tasks (01/11/17)    Status Achieved             Patient will benefit from skilled therapeutic intervention in order to improve the following deficits and impairments:     Visit Diagnosis: Muscle weakness (generalized)  Stiffness of right shoulder, not elsewhere classified     Problem List Patient Active Problem List   Diagnosis Date Noted  . Healthcare maintenance 11/03/2015  . OSA (obstructive sleep apnea) 11/03/2015  . S/P right TKA 09/29/2014  . S/P knee replacement 09/29/2014  . Alcohol use 07/06/2014  . Osteoarthritis of right knee 05/26/2014  . Erectile dysfunction 05/26/2014  . GERD (gastroesophageal reflux disease) 10/02/2012  . Umbilical hernia 16/57/9038  . Hypertension  07/29/2012  . Hyperlipidemia 07/29/2012  . Obesity (BMI 30-39.9) 07/29/2012    Jeral Pinch PT 01/08/2017, 1:48 PM  [facility] [place], [place], [address] Phone: [phone]   Fax:  [phone]  Name: Jim Hunter MRN: [id number] Date of Birth: [birth year]

## 2017-01-09 ENCOUNTER — Ambulatory Visit (INDEPENDENT_AMBULATORY_CARE_PROVIDER_SITE_OTHER): Payer: PRIVATE HEALTH INSURANCE | Admitting: Physical Therapy

## 2017-01-09 DIAGNOSIS — M25511 Pain in right shoulder: Secondary | ICD-10-CM

## 2017-01-09 DIAGNOSIS — M25611 Stiffness of right shoulder, not elsewhere classified: Secondary | ICD-10-CM

## 2017-01-09 DIAGNOSIS — M6281 Muscle weakness (generalized): Secondary | ICD-10-CM | POA: Diagnosis not present

## 2017-01-09 NOTE — Therapy (Signed)
[place], [place], [address] Phone: [phone]   Fax:  [phone]  Physical Therapy Treatment  Patient Details  Name: Jim Hunter MRN: [id number] Date of Birth: 03/14/1966 Referring Provider: Dr. Charlotte Sanes  Encounter Date: 01/09/2017      PT End of Session - 01/09/17 0825    Visit Number 49   Number of Visits 51   Date for PT Re-Evaluation 01/12/17   PT Start Time 0801   PT Stop Time 1205   PT Time Calculation (min) 244 min   Activity Tolerance Patient tolerated treatment well   Behavior During Therapy [facility] for tasks assessed/performed      Past Medical History:  Diagnosis Date  . Arthritis   . GERD (gastroesophageal reflux disease)   . Hyperlipidemia   . Hypertension   . 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 Sleep apnea 3 years ago   no borderline no cpap needed per sleep studty done    Past Surgical History:  Procedure Laterality Date  . [place] SURGERY  2006  . ROTATOR CUFF REPAIR  2010   left  . 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   Procedure: RIGHT TOTAL KNEE ARTHROPLASTY;  Surgeon: Paralee Cancel, MD;  Location: [facility];  Service: Orthopedics;  Laterality: Right;  . TYMPANOPLASTY     age 51    There were no vitals filed for this visit.      Subjective Assessment - 01/09/17 0817    Subjective Pt reports soreness today in his posterior Rt shoulder and upper arm; believes it is related to the weather.  Otherwise, nothing new to report.    Currently in Pain? Yes   Pain Score 2    Pain Location Shoulder   Pain Orientation Right;Posterior   Pain Descriptors / Indicators Sore   Aggravating Factors  ?   Pain Relieving Factors ice, massage            [facility] PT Assessment - 01/09/17 0001      Assessment   Medical Diagnosis Rt RTC Repair   Referring Provider Dr. Harrell Gave Tuohy   Onset  Date/Surgical Date 06/16/16   Hand Dominance Right   Next MD Visit 01/11/17           [facility] Adult PT Treatment/Exercise - 01/09/17 0001      Exercises   Other Exercises  See Work Conditioning flow sheet.   under media           PT Short Term Goals - 01/09/17 1314      PT SHORT TERM GOAL #1   Title I with initial HEP ( 09/01/16)    Time 4   Period Weeks   Status Achieved     PT SHORT TERM GOAL #2   Title demo Rt shoulder PROM WFL ( 11/15/16)    Time 12   Period Weeks   Status Achieved     PT SHORT TERM GOAL #3   Title tolerate initial elbow strengthening ( 09/01/16)    Time 4   Period Weeks   Status Achieved     PT SHORT TERM GOAL #4   Title improve FOTO =/< 55% limited ( 11/15/16)    Time 12   Period Weeks   Status Achieved     PT SHORT TERM GOAL #5   Title reduce pain =/> 50% with daily activity  Time 2   Period Weeks   Status Achieved           PT Long Term Goals - 01/09/17 1315      PT LONG TERM GOAL #1   Title I with advanced HEP ( 01/12/17)    Time 23   Period Weeks   Status On-going     PT LONG TERM GOAL #2   Title Patient demonstrates floor to waist with 50# (01/12/17)   Time 23   Period Weeks   Status Achieved     PT LONG TERM GOAL #3   Title demo Rt shoulder strength =/> 5-/5 ( 01/12/17)    Time 23   Period Weeks   Status Partially Met     PT LONG TERM GOAL #4   Title tolerate lifting with Rt UE per MD orders without increased pain ( 01/12/17)    Time 23   Status Partially Met     PT LONG TERM GOAL #5   Title improve FOTO =/< 33% limited (01/11/17)    Time 23   Period Weeks   Status Achieved     PT LONG TERM GOAL #6   Title Patient to demonstrate push/pull of 25# of force for return to work tasks (01/11/17)    Time 23   Status Achieved               Plan - 01/09/17 0826    Clinical Impression Statement Pt able to lift 50# box floor to/from waist, mulitple reps without any increase in symptoms; has met LTG#2.  Pt  reported Rt shoulder soreness reduced with exercise, including stretches.  Pt making good progress towards remaining goals.    Rehab Potential Excellent   PT Frequency 4x / week   PT Treatment/Interventions Moist Heat;Ultrasound;Therapeutic exercise;Dry needling;Taping;Vasopneumatic Device;Manual techniques;Neuromuscular re-education;Cryotherapy;Electrical Stimulation;Iontophoresis 4m/ml Dexamethasone;Patient/family education;Passive range of motion   PT Next Visit Plan Continue work conditioning, at 4 hours.   MD visit on 6/28.    Consulted and Agree with Plan of Care Patient      Patient will benefit from skilled therapeutic intervention in order to improve the following deficits and impairments:  Postural dysfunction, Decreased strength, Pain, Increased muscle spasms, Impaired UE functional use, Decreased range of motion  Visit Diagnosis: Muscle weakness (generalized)  Stiffness of right shoulder, not elsewhere classified  Acute pain of right shoulder     Problem List Patient Active Problem List   Diagnosis Date Noted  . Healthcare maintenance 11/03/2015  . OSA (obstructive sleep apnea) 11/03/2015  . S/P right TKA 09/29/2014  . S/P knee replacement 09/29/2014  . Alcohol use 07/06/2014  . Osteoarthritis of right knee 05/26/2014  . Erectile dysfunction 05/26/2014  . GERD (gastroesophageal reflux disease) 10/02/2012  . Umbilical hernia 080/09/4915 . Hypertension 07/29/2012  . Hyperlipidemia 07/29/2012  . Obesity (BMI 30-39.9) 07/29/2012    JKerin Perna PTA 01/09/17 1:18 PM  [facility] [place] [address]Phone: [phone]  Fax:  [phone] Name: RJehad BisonoMRN: [id number] of Birth: [birth year]

## 2017-01-10 ENCOUNTER — Encounter: Payer: Self-pay | Admitting: Rehabilitative and Restorative Service Providers"

## 2017-01-10 ENCOUNTER — Ambulatory Visit (INDEPENDENT_AMBULATORY_CARE_PROVIDER_SITE_OTHER): Payer: PRIVATE HEALTH INSURANCE | Admitting: Rehabilitative and Restorative Service Providers"

## 2017-01-10 DIAGNOSIS — M25511 Pain in right shoulder: Secondary | ICD-10-CM | POA: Diagnosis not present

## 2017-01-10 DIAGNOSIS — M6281 Muscle weakness (generalized): Secondary | ICD-10-CM | POA: Diagnosis not present

## 2017-01-10 DIAGNOSIS — M25611 Stiffness of right shoulder, not elsewhere classified: Secondary | ICD-10-CM | POA: Diagnosis not present

## 2017-01-10 NOTE — Patient Instructions (Signed)

## 2017-01-10 NOTE — Therapy (Addendum)
[place] [facility] [place], [place], [address] Phone: [phone]   Fax:  [phone]  Physical Therapy Treatment  Patient Details  Name: Jim Hunter MRN: [id number] Date of Birth: Oct 18, 1965 Referring Provider: Dr Charlotte Sanes  Encounter Date: 01/10/2017      PT End of Session - 01/10/17 0938    Visit Number 50   Number of Visits 51   Date for PT Re-Evaluation 01/12/17   PT Start Time 6962   PT Stop Time 1155   PT Time Calculation (min) 240 min   Activity Tolerance Patient tolerated treatment well      Past Medical History:  Diagnosis Date  . Arthritis   . GERD (gastroesophageal reflux disease)   . Hyperlipidemia   . Hypertension   . 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 Sleep apnea 3 years ago   no borderline no cpap needed per sleep studty done    Past Surgical History:  Procedure Laterality Date  . [place] SURGERY  2006  . ROTATOR CUFF REPAIR  2010   left  . 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   Procedure: RIGHT TOTAL KNEE ARTHROPLASTY;  Surgeon: Paralee Cancel, MD;  Location: [facility];  Service: Orthopedics;  Laterality: Right;  . TYMPANOPLASTY     age 51    There were no vitals filed for this visit.      Subjective Assessment - 01/10/17 0938    Subjective Patient reports that his shoulder feels good. He has some intermittent discomfort maybe 1-2/10. He has some aching in the deltoid area. Some discomfort could be related to the weather. He is using arm for functional activities. He feels he is ready to return to work.    Currently in Pain? No/denies            [facility] PT Assessment - 01/10/17 0001      Assessment   Medical Diagnosis Rt RTC Repair   Referring Provider Dr Harrell Gave Tuohy   Onset Date/Surgical Date 06/16/16   Hand Dominance Right   Next MD Visit 01/11/17     AROM   Right/Left Shoulder --   assessed with pt standing IR/ER at 90 deg abd/elbow 90 deg    Right Shoulder Extension 68 Degrees   Right Shoulder Flexion 162 Degrees   Right Shoulder ABduction 165 Degrees   Right Shoulder Internal Rotation 42 Degrees   Right Shoulder External Rotation 90 Degrees     Strength   Right Shoulder Flexion --  5-/5 discomfort   Right Shoulder Extension 5/5   Right Shoulder ABduction --  5-/5 discomfort    Right Shoulder Internal Rotation 5/5   Right Shoulder External Rotation --  5-/5 discomfort    Right Hand Grip (lbs) 106   Left Hand Grip (lbs) 110     Palpation   Palpation comment tightness noted through Rt pecs and deltoid with trigger points present                      [facility] Adult PT Treatment/Exercise - 01/10/17 0001      Exercises   Other Exercises  See Work Conditioning flow sheet.   under media     Manual Therapy   Manual therapy comments pt supine    Soft tissue mobilization working through Rt pecs/biceps/deltoid    Myofascial Release Rt pec release  Trigger Point Dry Needling - 01/10/17 1024    Consent Given? Yes   Muscles Treated Upper Body --  biceps Rt - decreased palpable tightness    Pectoralis Major Response Palpable increased muscle length   Pectoralis Minor Response Palpable increased muscle length              PT Education - 01/10/17 1024    Education provided Yes   Education Details DN   Person(s) Educated Patient   Methods Explanation   Comprehension Verbalized understanding          PT Short Term Goals - 01/10/17 1027      PT SHORT TERM GOAL #1   Title I with initial HEP ( 09/01/16)    Time 4   Period Weeks   Status Achieved     PT SHORT TERM GOAL #2   Title demo Rt shoulder PROM WFL ( 11/15/16)    Time 12   Period Weeks   Status Achieved     PT SHORT TERM GOAL #3   Title tolerate initial elbow strengthening ( 09/01/16)    Time 4   Period Weeks   Status Achieved     PT SHORT TERM GOAL #4    Title improve FOTO =/< 55% limited ( 11/15/16)    Time 12   Period Weeks   Status Achieved     PT SHORT TERM GOAL #5   Title reduce pain =/> 50% with daily activity   Time 2   Period Weeks   Status Achieved           PT Long Term Goals - 01/10/17 1028      PT LONG TERM GOAL #1   Title I with advanced HEP ( 01/12/17)    Time 23   Period Weeks   Status Partially Met     PT LONG TERM GOAL #2   Title Patient demonstrates floor to waist with 50# (01/12/17)   Time 23   Period Weeks   Status Achieved     PT LONG TERM GOAL #3   Title demo Rt shoulder strength =/> 5-/5 ( 01/12/17)    Time 23   Period Weeks   Status Achieved     PT LONG TERM GOAL #4   Title tolerate lifting with Rt UE per MD orders without increased pain ( 01/12/17)    Time 23   Period Weeks   Status Partially Met     PT LONG TERM GOAL #5   Title improve FOTO =/< 33% limited (01/11/17)    Time 23   Period Weeks   Status Achieved     PT LONG TERM GOAL #6   Title Patient to demonstrate push/pull of 25# of force for return to work tasks (01/11/17)    Time 23   Period Weeks   Status Achieved               Plan - 01/10/17 1029    Clinical Impression Statement (P)  Jim Hunter is lifting 50# box floor to/from waist multiple reps without increased symtpoms. He is lifting 25# waist to/from chest multiple reps without incresaed symptoms. He has improving tissue extensibility and responded well to trigger point dry needling today. Jim Hunter has some intermittent discomfort in the shoulder region with functional activiies but discomfort does not stop activity. He has accomplished most of rehab goals and continues to work well in therapy on return to work tasks.    Rehab Potential (P)  Excellent  PT Frequency (P)  4x / week   PT Duration (P)  --  17 weeks    PT Treatment/Interventions (P)  Moist Heat;Ultrasound;Therapeutic exercise;Dry needling;Taping;Vasopneumatic Device;Manual techniques;Neuromuscular  re-education;Cryotherapy;Electrical Stimulation;Iontophoresis 3m/ml Dexamethasone;Patient/family education;Passive range of motion   PT Next Visit Plan (P)  Continue work conditioning, at 4 hours as ordered.  Note to MD for visit on 6/28.    Consulted and Agree with Plan of Care (P)  Patient      Patient will benefit from skilled therapeutic intervention in order to improve the following deficits and impairments:  (P) Postural dysfunction, Decreased strength, Pain, Increased muscle spasms, Impaired UE functional use, Decreased range of motion  Visit Diagnosis: Muscle weakness (generalized)  Stiffness of right shoulder, not elsewhere classified  Acute pain of right shoulder     Problem List Patient Active Problem List   Diagnosis Date Noted  . Healthcare maintenance 11/03/2015  . OSA (obstructive sleep apnea) 11/03/2015  . S/P right TKA 09/29/2014  . S/P knee replacement 09/29/2014  . Alcohol use 07/06/2014  . Osteoarthritis of right knee 05/26/2014  . Erectile dysfunction 05/26/2014  . GERD (gastroesophageal reflux disease) 10/02/2012  . Umbilical hernia 044/92/5241 . Hypertension 07/29/2012  . Hyperlipidemia 07/29/2012  . Obesity (BMI 30-39.9) 07/29/2012    Lam Mccubbins PNilda SimmerPT, MPH  01/10/2017, 1:53 PM  [facility] [address] [address]Phone: [phone]  Fax:  [phone] Name: RWelden HausmannMRN: [id number] of Birth: 8February 18, 1967  PHYSICAL THERAPY DISCHARGE SUMMARY  Visits from Start of Care: 50  Current functional level related to goals / functional outcomes: See above   Remaining deficits: See above   Education / Equipment: HEP Plan: -  Patient goals were - --partially met, he is pleased with progress and ready to return to full duty at work.  He is being discharged at this time.   ?????    SJeral Pinch PT 02/19/17 10:28 AM

## 2017-02-14 MED FILL — FLUTICASONE PROP 50 MCG SPR: 50 | 30 days supply | Qty: 16 | Fill #1

## 2017-02-14 MED FILL — SILDENAFIL CITRATE 100 MG T: 100 | 30 days supply | Qty: 6 | Fill #1

## 2017-03-07 MED FILL — LISINOPRIL 20 MG TABS: 20 | 90 days supply | Qty: 90 | Fill #1

## 2017-04-10 ENCOUNTER — Other Ambulatory Visit: Payer: Self-pay | Admitting: Obstetrics and Gynecology

## 2017-04-10 MED FILL — FLUTICASONE PROP 50 MCG SPR: 50 | 30 days supply | Qty: 16 | Fill #0

## 2017-04-10 MED FILL — SILDENAFIL CITRATE 100 MG T: 100 | 30 days supply | Qty: 6 | Fill #2

## 2017-05-04 ENCOUNTER — Telehealth: Payer: Self-pay | Admitting: Family Medicine

## 2017-05-04 ENCOUNTER — Other Ambulatory Visit: Payer: Self-pay | Admitting: Family Medicine

## 2017-05-04 MED ORDER — OMEPRAZOLE 20 MG PO CPDR: 20.0000 mg | DELAYED_RELEASE_CAPSULE | Freq: Every day | ORAL | 99 refills | Status: DC

## 2017-05-04 MED FILL — SILDENAFIL CITRATE 100 MG T: 100 | 30 days supply | Qty: 6 | Fill #3

## 2017-05-04 MED FILL — FLUTICASONE PROP 50 MCG SPR: 50 | 30 days supply | Qty: 16 | Fill #1

## 2017-05-04 NOTE — Telephone Encounter (Signed)
Patient calling to request refill of:  Name of Medication(s):  Omeprazole Last date of OV:  12/04/2016 Pharmacy:  [facility]   Will forward to MD to advise. Jazmin Hartsell,CMA

## 2017-05-04 NOTE — Telephone Encounter (Signed)
Pt came asking for authorization refill for Rx Omeprazole ([facility]Burn), [phone]

## 2017-05-04 NOTE — Telephone Encounter (Signed)
Refill sent. Please let patient know. Thanks!  Rory Percy, DO PGY-1, [facility] 05/04/2017 4:53 PM

## 2017-06-01 MED FILL — FLUTICASONE PROP 50 MCG SPR: 50 | 30 days supply | Qty: 16 | Fill #2

## 2017-06-01 MED FILL — SILDENAFIL CITRATE 100 MG T: 100 | 30 days supply | Qty: 6 | Fill #4

## 2017-07-02 ENCOUNTER — Other Ambulatory Visit: Payer: Self-pay | Admitting: Obstetrics and Gynecology

## 2017-07-02 MED FILL — LISINOPRIL 20 MG TABLET: 20 | 30 days supply | Qty: 30 | Fill #0

## 2017-07-02 MED FILL — FLUTICASONE PROP 50 MCG SPR: 50 | 30 days supply | Qty: 16 | Fill #3

## 2017-07-02 MED FILL — SILDENAFIL CITRATE 100 MG T: 100 | 30 days supply | Qty: 6 | Fill #0

## 2017-07-30 ENCOUNTER — Other Ambulatory Visit: Payer: Self-pay | Admitting: Obstetrics and Gynecology

## 2017-07-30 MED FILL — FLUTICASONE PROP 50 MCG SPR: 50 | 30 days supply | Qty: 16 | Fill #4

## 2017-07-30 MED FILL — LISINOPRIL 20 MG TABLET: 20 | 90 days supply | Qty: 90 | Fill #0

## 2017-07-30 MED FILL — SILDENAFIL CITRATE 100 MG T: 100 | 30 days supply | Qty: 6 | Fill #1

## 2017-09-19 MED FILL — FLUTICASONE PROP 50 MCG SPR: 50 | 30 days supply | Qty: 16 | Fill #5

## 2017-09-19 MED FILL — SILDENAFIL CITRATE 100 MG T: 100 | 30 days supply | Qty: 6 | Fill #2

## 2017-09-20 ENCOUNTER — Other Ambulatory Visit: Payer: Self-pay | Admitting: *Deleted

## 2017-09-20 MED ORDER — OMEPRAZOLE 20 MG PO CPDR: 20.0000 mg | DELAYED_RELEASE_CAPSULE | Freq: Every day | ORAL | 99 refills | Status: DC

## 2017-10-11 MED FILL — SILDENAFIL CITRATE 100 MG T: 100 | 30 days supply | Qty: 6 | Fill #3

## 2017-10-11 MED FILL — OMEPRAZOLE 20 MG CAP: 20 | 90 days supply | Qty: 90 | Fill #0

## 2017-10-11 MED FILL — FLUTICASONE PROP 50 MCG SPR: 50 | 30 days supply | Qty: 16 | Fill #6

## 2017-10-29 ENCOUNTER — Other Ambulatory Visit: Payer: Self-pay | Admitting: Family Medicine

## 2017-10-30 MED FILL — LISINOPRIL 20 MG TABLET: 20 | 90 days supply | Qty: 90 | Fill #0

## 2017-11-07 ENCOUNTER — Other Ambulatory Visit: Payer: Self-pay | Admitting: Family Medicine

## 2017-11-07 MED FILL — SILDENAFIL CITRATE 100 MG T: 100 | 30 days supply | Qty: 6 | Fill #4

## 2017-11-08 MED FILL — FLUTICASONE PROP 50 MCG SPR: 50 | 30 days supply | Qty: 16 | Fill #0

## 2018-01-01 ENCOUNTER — Other Ambulatory Visit: Payer: Self-pay | Admitting: Family Medicine

## 2018-01-01 MED FILL — OMEPRAZOLE 20 MG CAP: 20 | 90 days supply | Qty: 90 | Fill #1

## 2018-01-01 MED FILL — FLUTICASONE PROP 50 MCG SPR: 50 | 30 days supply | Qty: 16 | Fill #1

## 2018-01-02 MED FILL — SILDENAFIL CITRATE 100 MG T: 100 | 30 days supply | Qty: 6 | Fill #0

## 2018-02-11 ENCOUNTER — Other Ambulatory Visit: Payer: Self-pay | Admitting: Family Medicine

## 2018-02-11 MED FILL — SILDENAFIL CITRATE 100 MG T: 100 | 30 days supply | Qty: 6 | Fill #1

## 2018-02-12 MED FILL — LISINOPRIL 20 MG TABLET: 20 | 90 days supply | Qty: 90 | Fill #0

## 2018-03-11 MED FILL — SILDENAFIL CITRATE 100 MG T: 100 | 30 days supply | Qty: 6 | Fill #2

## 2018-04-11 NOTE — Progress Notes (Signed)
Subjective:   Patient ID: Jim Hunter    DOB: 1966-04-05, 52 y.o. male   MRN: [id number]  Jim Hunter is a 52 y.o. male with a history of HTN, OSA, GERD, ED, HLD, obesity, s/p knee replacement, s/p rotator cuff repair here for annual checkup  Current Outpatient Medications  Medication Sig Dispense Refill  . fluticasone (FLONASE) 50 MCG/ACT nasal spray PLACE 2 SPRAYS INTO BOTH NOSTRILS DAILY. 16 g 6  . lisinopril-hydrochlorothiazide (ZESTORETIC) 20-12.5 MG tablet Take 1 tablet by mouth daily. 90 tablet 3  . ranitidine (ZANTAC) 150 MG capsule Take 1 capsule (150 mg total) by mouth 2 (two) times daily. 180 capsule 2  . sildenafil (VIAGRA) 100 MG tablet TAKE 1 TABLET BY MOUTH DAILY 1 HR BEFORE SEXUAL ACTIVITY AS NEEDED FOR ERECTILE DYSFUNCTION. 10 tablet 2   No current facility-administered medications for this visit.    Allergies: Crestor [rosuvastatin] and Lipitor [atorvastatin calcium]   Hypertension: - Medications: lisinopril 20mg  - Compliance: good - Checking BP at home: no - Denies any SOB, CP, vision changes, LE edema, medication SEs, or symptoms of hypotension - sometimes will have headaches when he thinks his BP is high - Diet: coffee and roll in am (works 2nd shift). No consistent diet. Endorses eating lots of vegetables. Timing of meals is not consistent due to work schedule. Working on cutting salt out of diet. Denies double portions. - Exercise: walks all day at work, no activity outside of work.  Healthcare Maintenance - Vaccines: flu - Colonoscopy: due - Lipid Panel: due  ROS:  Feeling well. No dyspnea or chest pain. No abdominal pain, change in bowel habits. No urinary tract or prostatic symptoms. No neurological complaints.  Review of Systems:  Per HPI.  [place], medications and smoking status reviewed.  Objective:   BP (!) 154/96   Pulse 83   Temp 98.2 F (36.8 C) (Oral)   Ht 5\' 9"  (1.753 m)   Wt 241 lb (109.3 kg)   SpO2 94%   BMI 35.59 kg/m  Vitals and  nursing note reviewed.  General: overweight male, in no acute distress with non-toxic appearance CV: regular rate and rhythm without murmurs, rubs, or gallops, no lower extremity edema Lungs: clear to auscultation bilaterally with normal work of breathing Abdomen: soft, non-tender, non-distended, normoactive bowel sounds Skin: warm, dry, no rashes or lesions Extremities: warm and well perfused, normal tone MSK: ROM grossly intact, strength intact, gait normal Neuro: Alert and oriented, speech normal  Assessment & Plan:   Hypertension Elevated on recheck. Discussed lifestyle modifications through diet and exercise. Will add HCTZ 12.5mg . Instructed to check BP at work a week after starting the new medication and call with results. Will check BMP today.  OSA (obstructive sleep apnea) Sleep study in 2017 with minimal desats, did not recommend sleep study. Weight loss will aid in snoring.  Obesity (BMI 30-39.9) Likely contributing to lack of blood pressure control. Recommended lifestyle interventions with diet and exercise and increasing vegetable consumption.  Healthcare maintenance Will obtain flu vaccine at work. Declined colonoscopy. Obtained lipid panel today.  Orders Placed This Encounter  Procedures  . Lipid Panel  . Basic Metabolic Panel   Meds ordered this encounter  Medications  . lisinopril-hydrochlorothiazide (ZESTORETIC) 20-12.5 MG tablet    Sig: Take 1 tablet by mouth daily.    Dispense:  90 tablet    Refill:  3  . ranitidine (ZANTAC) 150 MG capsule    Sig: Take 1 capsule (150 mg total) by mouth 2 (  two) times daily.    Dispense:  180 capsule    Refill:  [place], DO PGY-2, [facility] 04/12/2018 4:00 PM

## 2018-04-12 ENCOUNTER — Other Ambulatory Visit: Payer: Self-pay

## 2018-04-12 ENCOUNTER — Ambulatory Visit (INDEPENDENT_AMBULATORY_CARE_PROVIDER_SITE_OTHER): Payer: Self-pay | Admitting: Family Medicine

## 2018-04-12 ENCOUNTER — Encounter: Payer: Self-pay | Admitting: Family Medicine

## 2018-04-12 VITALS — BP 154/96 | HR 83 | Temp 98.2°F | Ht 69.0 in | Wt 241.0 lb

## 2018-04-12 DIAGNOSIS — Z0000 Encounter for general adult medical examination without abnormal findings: Secondary | ICD-10-CM

## 2018-04-12 DIAGNOSIS — G4733 Obstructive sleep apnea (adult) (pediatric): Secondary | ICD-10-CM

## 2018-04-12 DIAGNOSIS — I10 Essential (primary) hypertension: Secondary | ICD-10-CM

## 2018-04-12 DIAGNOSIS — E669 Obesity, unspecified: Secondary | ICD-10-CM

## 2018-04-12 MED ORDER — RANITIDINE HCL 150 MG PO CAPS: 150.0000 mg | ORAL_CAPSULE | Freq: Two times a day (BID) | ORAL | 2 refills | Status: DC

## 2018-04-12 MED ORDER — LISINOPRIL-HYDROCHLOROTHIAZIDE 20-12.5 MG PO TABS: 1.0000 | ORAL_TABLET | Freq: Every day | ORAL | 3 refills | Status: DC

## 2018-04-12 NOTE — Assessment & Plan Note (Signed)
Likely contributing to lack of blood pressure control. Recommended lifestyle interventions with diet and exercise and increasing vegetable consumption.

## 2018-04-12 NOTE — Patient Instructions (Addendum)
It was great to see you!  Our plans for today:  - We recommend obtaining a colonoscopy to screen for colon cancer. - We are increasing your blood pressure medication dose. Return to clinic at your convenience in about a week or so for a blood pressure check. - We are changing your acid reflux medication, if you have issues with this, let us know.  We are checking some labs today, we will call you with these results.  Take care and seek immediate care sooner if you develop any concerns.   Dr. [facility]

## 2018-04-12 NOTE — Assessment & Plan Note (Signed)
Will obtain flu vaccine at work. Declined colonoscopy. Obtained lipid panel today.

## 2018-04-12 NOTE — Assessment & Plan Note (Signed)
Elevated on recheck. Discussed lifestyle modifications through diet and exercise. Will add HCTZ 12.5mg . Instructed to check BP at work a week after starting the new medication and call with results. Will check BMP today.

## 2018-04-12 NOTE — Assessment & Plan Note (Signed)
Sleep study in 2017 with minimal desats, did not recommend sleep study. Weight loss will aid in snoring.

## 2018-04-13 LAB — BASIC METABOLIC PANEL
BUN/Creatinine Ratio: 14 (ref 9–20)
BUN: 14 mg/dL (ref 6–24)
CO2: 22 mmol/L (ref 20–29)
Calcium: 8.9 mg/dL (ref 8.7–10.2)
Chloride: 105 mmol/L (ref 96–106)
Creatinine, Ser: 1 mg/dL (ref 0.76–1.27)
GFR calc non Af Amer: 86 mL/min/{1.73_m2} (ref >59)
GFR, EST AFRICAN AMERICAN: 99 mL/min/{1.73_m2} (ref >59)
Glucose: 88 mg/dL (ref 65–99)
POTASSIUM: 4.1 mmol/L (ref 3.5–5.2)
SODIUM: 142 mmol/L (ref 134–144)

## 2018-04-13 LAB — LIPID PANEL
CHOLESTEROL TOTAL: 230 mg/dL — AB (ref 100–199)
Chol/HDL Ratio: 5.2 ratio — ABNORMAL HIGH (ref 0.0–5.0)
HDL: 44 mg/dL (ref >39)
LDL Calculated: 142 mg/dL — ABNORMAL HIGH (ref 0–99)
TRIGLYCERIDES: 219 mg/dL — AB (ref 0–149)
VLDL CHOLESTEROL CAL: 44 mg/dL — AB (ref 5–40)

## 2018-04-19 ENCOUNTER — Telehealth: Payer: Self-pay | Admitting: Family Medicine

## 2018-04-19 NOTE — Telephone Encounter (Signed)
Called patient to discuss options for medications to lower his cholesterol but had to leave voicemail. We spoke earlier about the need to lower his cholesterol with medication as he has tried lifestyle modifications for over a year and it has not improved. He is allergic to lipitor and crestor and I called to elicit his feelings about starting a different medication called Ezetimibe (Zetia), which is not a statin. This medication should be relatively affordable and covered by his insurance. He could also get a 90 day supply at [facility] for $23 without insurance coverage. If he calls back and is willing to try this, I will place the order.  Rory Percy, DO PGY-2, [facility] 04/19/2018 6:01 PM

## 2018-05-09 MED FILL — raNITIdine HCL 150 MG TABS: 150 | 90 days supply | Qty: 180 | Fill #0

## 2018-05-09 MED FILL — SILDENAFIL CITRATE 100 MG T: 100 | 30 days supply | Qty: 6 | Fill #4

## 2018-05-09 MED FILL — LISINOPRIL-HCTZ 20-12.5 MG: 20-12.5 | 90 days supply | Qty: 90 | Fill #0

## 2018-06-03 ENCOUNTER — Other Ambulatory Visit: Payer: Self-pay | Admitting: Family Medicine

## 2018-06-03 MED FILL — SILDENAFIL CITRATE 100 MG T: 100 | 50 days supply | Qty: 10 | Fill #0

## 2018-06-05 NOTE — Telephone Encounter (Signed)
Opened in Error.  Kerin Perna, PTA 06/05/18 3:36 PM

## 2018-07-11 MED FILL — SILDENAFIL CITRATE 100 MG T: 100 | 50 days supply | Qty: 10 | Fill #1

## 2018-08-08 MED FILL — LISINOPRIL-HCTZ 20-12.5 MG: 20-12.5 | 90 days supply | Qty: 90 | Fill #1

## 2018-08-12 MED FILL — SILDENAFIL CITRATE 100 MG T: 100 | 30 days supply | Qty: 6 | Fill #2

## 2018-09-03 ENCOUNTER — Other Ambulatory Visit: Payer: Self-pay

## 2018-09-03 MED ORDER — SILDENAFIL CITRATE 100 MG PO TABS: ORAL_TABLET | ORAL | 2 refills | Status: DC

## 2018-09-03 MED FILL — SILDENAFIL CITRATE 100 MG T: 100 | 50 days supply | Qty: 10 | Fill #0 | Status: TO

## 2018-10-04 MED FILL — SILDENAFIL CITRATE 100 MG T: 100 | 20 days supply | Qty: 4 | Fill #3

## 2018-11-07 MED FILL — SILDENAFIL CITRATE 100 MG T: 100 | 50 days supply | Qty: 10 | Fill #0

## 2018-11-07 MED FILL — LISINOPRIL-HCTZ 20-12.5 MG: 20-12.5 | 90 days supply | Qty: 90 | Fill #0

## 2018-11-28 ENCOUNTER — Other Ambulatory Visit: Payer: Self-pay | Admitting: Family Medicine

## 2018-11-29 ENCOUNTER — Other Ambulatory Visit: Payer: Self-pay | Admitting: Family Medicine

## 2018-12-17 MED FILL — SILDENAFIL CITRATE 100 MG T: 100 | 30 days supply | Qty: 6 | Fill #1

## 2018-12-30 ENCOUNTER — Encounter: Payer: Self-pay | Admitting: Family Medicine

## 2018-12-30 ENCOUNTER — Other Ambulatory Visit: Payer: Self-pay

## 2018-12-30 ENCOUNTER — Ambulatory Visit (INDEPENDENT_AMBULATORY_CARE_PROVIDER_SITE_OTHER): Payer: No Typology Code available for payment source | Admitting: Family Medicine

## 2018-12-30 VITALS — BP 105/72 | HR 88 | Temp 98.8°F | Wt 235.0 lb

## 2018-12-30 DIAGNOSIS — Z1211 Encounter for screening for malignant neoplasm of colon: Secondary | ICD-10-CM | POA: Diagnosis not present

## 2018-12-30 DIAGNOSIS — Z1159 Encounter for screening for other viral diseases: Secondary | ICD-10-CM

## 2018-12-30 DIAGNOSIS — Z7289 Other problems related to lifestyle: Secondary | ICD-10-CM | POA: Diagnosis not present

## 2018-12-30 DIAGNOSIS — R0609 Other forms of dyspnea: Secondary | ICD-10-CM | POA: Insufficient documentation

## 2018-12-30 DIAGNOSIS — I10 Essential (primary) hypertension: Secondary | ICD-10-CM | POA: Diagnosis not present

## 2018-12-30 DIAGNOSIS — R0600 Dyspnea, unspecified: Secondary | ICD-10-CM | POA: Insufficient documentation

## 2018-12-30 DIAGNOSIS — Z0000 Encounter for general adult medical examination without abnormal findings: Secondary | ICD-10-CM

## 2018-12-30 DIAGNOSIS — R5383 Other fatigue: Secondary | ICD-10-CM

## 2018-12-30 DIAGNOSIS — E785 Hyperlipidemia, unspecified: Secondary | ICD-10-CM | POA: Diagnosis not present

## 2018-12-30 DIAGNOSIS — E669 Obesity, unspecified: Secondary | ICD-10-CM

## 2018-12-30 DIAGNOSIS — K219 Gastro-esophageal reflux disease without esophagitis: Secondary | ICD-10-CM

## 2018-12-30 DIAGNOSIS — R0602 Shortness of breath: Secondary | ICD-10-CM

## 2018-12-30 DIAGNOSIS — Z789 Other specified health status: Secondary | ICD-10-CM

## 2018-12-30 DIAGNOSIS — Z114 Encounter for screening for human immunodeficiency virus [HIV]: Secondary | ICD-10-CM

## 2018-12-30 MED ORDER — OMEPRAZOLE 20 MG PO CPDR: 20.0000 mg | DELAYED_RELEASE_CAPSULE | Freq: Every day | ORAL | 1 refills | Status: DC

## 2018-12-30 MED ORDER — PRAVASTATIN SODIUM 10 MG PO TABS: 10.0000 mg | ORAL_TABLET | Freq: Every day | ORAL | 3 refills | Status: DC

## 2018-12-30 MED FILL — OMEPRAZOLE 20 MG CPDR: 20 | 90 days supply | Qty: 90 | Fill #0

## 2018-12-30 NOTE — Assessment & Plan Note (Signed)
Will obtain updated lipid panel. Previously intolerant of statin. Can't afford zetia. Will trial low dose pravastatin with strict precautions to call if not tolerating.

## 2018-12-30 NOTE — Assessment & Plan Note (Signed)
Has lost some weight, congratulated patient! Contributing to acid reflux and likely exercise tolerance. Continued counseling provided regarding lifestyle modifications through diet and exercise, see AVS.

## 2018-12-30 NOTE — Assessment & Plan Note (Signed)
At goal without hypotensive symptoms. No changes made today.

## 2018-12-30 NOTE — Progress Notes (Signed)
Subjective:   Patient ID: Jim Hunter    DOB: 1965-12-15, 53 y.o. male   MRN: [id number]  Jim Hunter is a 53 y.o. male with a history of HTN, OSA, GERD, R knee OA, HLD, obesity, ED, alcohol use here for   Winded easily - gets winded easily walking up flight of stairs. Also with hot and cold flashes sometimes at night. Attributed to weight and diet. - going on the past couple of months - no fevers, chest pain, vision changes, N/V, LE swelling - drinks 6 pack on weekends. Will get hangovers once in a month - trying to lose weight, walks around a lot at work.  Hypertension: - Medications: lisinopril-HCTZ 20-12.5mg  - Compliance: good - Checking BP at home: no - Denies any SOB, CP, vision changes, LE edema, medication SEs, or symptoms of hypotension - Diet: eating less, trying to lose weight. Eating lots of vegetables. Salads give him reflux. Eats fruits. - Exercise: walks around a lot at work  Acid reflux - greasy [place] foods worsen - taking omeprazole prn, helps sometimes - does not smoke  Review of Systems:  Per HPI.  [place], medications and smoking status reviewed.  Objective:   BP 105/72   Pulse 88   Temp 98.8 F (37.1 C) (Oral)   Wt 235 lb (106.6 kg)   SpO2 98%   BMI 34.70 kg/m  Vitals and nursing note reviewed.  General: obese male, in no acute distress with non-toxic appearance CV: regular rate and rhythm without murmurs, rubs, or gallops, no lower extremity edema Lungs: clear to auscultation bilaterally with normal work of breathing Abdomen: soft, non-tender, non-distended, no masses or organomegaly palpable, normoactive bowel sounds Skin: warm, dry, no rashes or lesions Extremities: warm and well perfused, normal tone MSK: ROM grossly intact, strength intact, gait normal Neuro: Alert and oriented, speech normal  Assessment & Plan:   Hyperlipidemia Will obtain updated lipid panel. Previously intolerant of statin. Can't afford zetia. Will trial low dose  pravastatin with strict precautions to call if not tolerating.  Alcohol use 6 pack of beer on the weekends with once per month hangover. Counseling provided.  Healthcare maintenance Referral placed for colonoscopy. Will obtain HIV and hep C screening today.  Obesity (BMI 30-39.9) Has lost some weight, congratulated patient! Contributing to acid reflux and likely exercise tolerance. Continued counseling provided regarding lifestyle modifications through diet and exercise, see AVS.  GERD (gastroesophageal reflux disease) Stable on prn omeprazole. No red flag symptoms. Counseled on looking for possible triggering foods and to avoid those. Counseled on weight loss.  Hypertension At goal without hypotensive symptoms. No changes made today.  Short of breath on exertion Winded with exertion, mainly with stairs. No rest symptoms, chest pain, or LE swelling. Normal cardiac and lung exam today. Suspect related to exercise intolerance as he is largely inactive other than walking around at work. Will however check CBC, CMP, TSH to r/o anemia or thyroid disease. Referral made to GI for colon cancer screening. Also obtaining HIV and Hep C.   Orders Placed This Encounter  Procedures  . Lipid Panel  . CBC  . HIV antibody (with reflex)  . TSH  . Comprehensive metabolic panel    Order Specific Question:   Has the patient fasted?    Answer:   No  . Hepatitis C antibody  . Ambulatory referral to Gastroenterology    Referral Priority:   Routine    Referral Type:   Consultation    Referral Reason:  Specialty Services Required    Number of Visits Requested:   1   Meds ordered this encounter  Medications  . omeprazole (PRILOSEC) 20 MG capsule    Sig: Take 1 capsule (20 mg total) by mouth daily.    Dispense:  90 capsule    Refill:  1  . pravastatin (PRAVACHOL) 10 MG tablet    Sig: Take 1 tablet (10 mg total) by mouth daily.    Dispense:  90 tablet    Refill:  [place], DO PGY-2,  [facility] 12/30/2018 2:56 PM

## 2018-12-30 NOTE — Assessment & Plan Note (Addendum)
Winded with exertion, mainly with stairs. No rest symptoms, chest pain, or LE swelling. Normal cardiac and lung exam today. Suspect related to exercise intolerance as he is largely inactive other than walking around at work. Will however check CBC, CMP, TSH to r/o anemia or thyroid disease. Referral made to GI for colon cancer screening. Also obtaining HIV and Hep C.

## 2018-12-30 NOTE — Patient Instructions (Addendum)
It was great to see you!  Our plans for today:  - We are checking some labs today, we will call you or send you a letter with these results. - You will get a call about scheduling a colonoscopy. - Take the pravastatin to lower your cholesterol. Let us know if you don't tolerate this. - Get a pill box to keep track of your medications.  - Try cutting back on the amount of alcohol you drink on the weekends. - Great job on losing weight! Keep up the good work, walk more and eat less. :) See below for tips.  Take care and seek immediate care sooner if you develop any concerns.   Dr. [facility]  Here is an example of what a healthy plate looks like:    ? Make half your plate fruits and vegetables.     ? Focus on whole fruits.     ? Vary your veggies.  ? Make half your grains whole grains. -     ? Look for the word "whole" at the beginning of the ingredients list    ? Some whole-grain ingredients include whole oats, whole-wheat flour,        whole-grain corn, whole-grain brown rice, and whole rye.  ? Move to low-fat and fat-free milk or yogurt.  ? Vary your protein routine. - Meat, fish, poultry (chicken, [place]), eggs, beans (kidney, pinto), dairy.  ? Drink and eat less sodium, saturated fat, and added sugars.  Look for opportunities to move your body throughout your day:  Never lie down when you can sit; never sit when you can stand; never stand when you can pace.  Moving your body throughout the day is just as important as the 30 or 60 minutes of exercise at the gym!  Get social Get active with your friends instead of going out to eat. Go for a hike, walk around the mall, or play an exercise-themed video game.   Move more at work Fit more activity into the workday. Stand during phone calls, use a printer farther from your desk, and get up to stretch each hour.    Do something new Develop a new skill to kick-start your motivation. Sign up for a class to  learn how to [employer], surf, do tai chi, or play a sport.    Keep cool in the pool Don't like to sweat? Hit the local community pool for a swim, water polo, or water aerobics class to stay cool while exercising.    Stay on track Use a fitness tracker (FITBIT, Fitness Pal mobile app) to track your activity and provide motivation to reach your goals.

## 2018-12-30 NOTE — Assessment & Plan Note (Addendum)
Referral placed for colonoscopy. Will obtain HIV and hep C screening today.

## 2018-12-30 NOTE — Assessment & Plan Note (Signed)
Stable on prn omeprazole. No red flag symptoms. Counseled on looking for possible triggering foods and to avoid those. Counseled on weight loss.

## 2018-12-30 NOTE — Assessment & Plan Note (Signed)
6 pack of beer on the weekends with once per month hangover. Counseling provided.

## 2018-12-31 LAB — LIPID PANEL
Chol/HDL Ratio: 5.6 ratio — ABNORMAL HIGH (ref 0.0–5.0)
Cholesterol, Total: 180 mg/dL (ref 100–199)
HDL: 32 mg/dL — ABNORMAL LOW (ref >39)
LDL Calculated: 80 mg/dL (ref 0–99)
Triglycerides: 338 mg/dL — ABNORMAL HIGH (ref 0–149)
VLDL Cholesterol Cal: 68 mg/dL — ABNORMAL HIGH (ref 5–40)

## 2018-12-31 LAB — COMPREHENSIVE METABOLIC PANEL
ALT: 24 IU/L (ref 0–44)
AST: 28 IU/L (ref 0–40)
Albumin/Globulin Ratio: 1.5 (ref 1.2–2.2)
Albumin: 4.1 g/dL (ref 3.8–4.9)
Alkaline Phosphatase: 83 IU/L (ref 39–117)
BUN/Creatinine Ratio: 15 (ref 9–20)
BUN: 16 mg/dL (ref 6–24)
Bilirubin Total: 0.2 mg/dL (ref 0.0–1.2)
CO2: 21 mmol/L (ref 20–29)
Calcium: 9.4 mg/dL (ref 8.7–10.2)
Chloride: 103 mmol/L (ref 96–106)
Creatinine, Ser: 1.07 mg/dL (ref 0.76–1.27)
GFR calc Af Amer: 91 mL/min/{1.73_m2} (ref >59)
GFR calc non Af Amer: 79 mL/min/{1.73_m2} (ref >59)
Globulin, Total: 2.7 g/dL (ref 1.5–4.5)
Glucose: 99 mg/dL (ref 65–99)
Potassium: 3.8 mmol/L (ref 3.5–5.2)
Sodium: 138 mmol/L (ref 134–144)
Total Protein: 6.8 g/dL (ref 6.0–8.5)

## 2018-12-31 LAB — CBC
Hematocrit: 42.6 % (ref 37.5–51.0)
Hemoglobin: 14.7 g/dL (ref 13.0–17.7)
MCH: 30.9 pg (ref 26.6–33.0)
MCHC: 34.5 g/dL (ref 31.5–35.7)
MCV: 90 fL (ref 79–97)
Platelets: 252 10*3/uL (ref 150–450)
RBC: 4.75 x10E6/uL (ref 4.14–5.80)
RDW: 13.7 % (ref 11.6–15.4)
WBC: 5.8 10*3/uL (ref 3.4–10.8)

## 2018-12-31 LAB — HIV ANTIBODY (ROUTINE TESTING W REFLEX): HIV Screen 4th Generation wRfx: NONREACTIVE

## 2018-12-31 LAB — TSH: TSH: 3.03 u[IU]/mL (ref 0.450–4.500)

## 2018-12-31 LAB — HEPATITIS C ANTIBODY: Hep C Virus Ab: <0.1 s/co ratio (ref 0.0–0.9)

## 2019-01-08 ENCOUNTER — Other Ambulatory Visit: Payer: Self-pay

## 2019-01-08 ENCOUNTER — Ambulatory Visit: Payer: No Typology Code available for payment source | Admitting: *Deleted

## 2019-01-08 VITALS — Ht 69.0 in | Wt 235.0 lb

## 2019-01-08 DIAGNOSIS — Z1211 Encounter for screening for malignant neoplasm of colon: Secondary | ICD-10-CM

## 2019-01-08 MED ORDER — NA SULFATE-K SULFATE-MG SULF 17.5-3.13-1.6 GM/177ML PO SOLN: 1.0000 | Freq: Once | ORAL | 0 refills | Status: AC

## 2019-01-08 MED FILL — SUPREP BOWEL PREP KIT: 17.5-3.13-1 | 1 days supply | Qty: 354 | Fill #0

## 2019-01-08 NOTE — Progress Notes (Signed)

## 2019-01-14 MED FILL — SILDENAFIL CITRATE 100 MG T: 100 | 20 days supply | Qty: 4 | Fill #0

## 2019-01-24 ENCOUNTER — Telehealth: Payer: Self-pay | Admitting: Internal Medicine

## 2019-01-24 NOTE — Telephone Encounter (Signed)

## 2019-01-25 ENCOUNTER — Ambulatory Visit (AMBULATORY_SURGERY_CENTER): Payer: No Typology Code available for payment source | Admitting: Internal Medicine

## 2019-01-25 ENCOUNTER — Other Ambulatory Visit: Payer: Self-pay

## 2019-01-25 ENCOUNTER — Encounter: Payer: Self-pay | Admitting: Internal Medicine

## 2019-01-25 VITALS — BP 104/73 | HR 67 | Temp 99.0°F | Resp 10 | Ht 69.0 in | Wt 235.0 lb

## 2019-01-25 DIAGNOSIS — Z1211 Encounter for screening for malignant neoplasm of colon: Secondary | ICD-10-CM | POA: Diagnosis present

## 2019-01-25 DIAGNOSIS — D123 Benign neoplasm of transverse colon: Secondary | ICD-10-CM

## 2019-01-25 MED ORDER — SODIUM CHLORIDE 0.9 % IV SOLN: 500.0000 mL | Freq: Once | INTRAVENOUS | Status: DC

## 2019-01-25 NOTE — Patient Instructions (Addendum)
I found and removed one very tiny polyp. No signs of cancer.  I will let you know pathology results and when to have another routine colonoscopy by mail and/or My Chart.  I appreciate the opportunity to care for you. Gatha Mayer, MD, [facility]   Discharge instructions given. Handout on polyps. Resume previous medications. YOU HAD AN ENDOSCOPIC PROCEDURE TODAY AT [facility]:   Refer to the procedure report that was given to you for any specific questions about what was found during the examination.  If the procedure report does not answer your questions, please call your gastroenterologist to clarify.  If you requested that your care partner not be given the details of your procedure findings, then the procedure report has been included in a sealed envelope for you to review at your convenience later.  YOU SHOULD EXPECT: Some feelings of bloating in the abdomen. Passage of more gas than usual.  Walking can help get rid of the air that was put into your GI tract during the procedure and reduce the bloating. If you had a lower endoscopy (such as a colonoscopy or flexible sigmoidoscopy) you may notice spotting of blood in your stool or on the toilet paper. If you underwent a bowel prep for your procedure, you may not have a normal bowel movement for a few days.  Please Note:  You might notice some irritation and congestion in your nose or some drainage.  This is from the oxygen used during your procedure.  There is no need for concern and it should clear up in a day or so.  SYMPTOMS TO REPORT IMMEDIATELY:   Following lower endoscopy (colonoscopy or flexible sigmoidoscopy):  Excessive amounts of blood in the stool  Significant tenderness or worsening of abdominal pains  Swelling of the abdomen that is new, acute  Fever of 100F or higher  For urgent or emergent issues, a gastroenterologist can be reached at any hour by calling [phone].   DIET:  We do  recommend a small meal at first, but then you may proceed to your regular diet.  Drink plenty of fluids but you should avoid alcoholic beverages for 24 hours.  ACTIVITY:  You should plan to take it easy for the rest of today and you should NOT DRIVE or use heavy machinery until tomorrow (because of the sedation medicines used during the test).    FOLLOW UP: Our staff will call the number listed on your records 48-72 hours following your procedure to check on you and address any questions or concerns that you may have regarding the information given to you following your procedure. If we do not reach you, we will leave a message.  We will attempt to reach you two times.  During this call, we will ask if you have developed any symptoms of COVID 19. If you develop any symptoms (ie: fever, flu-like symptoms, shortness of breath, cough etc.) before then, please call [phone].  If you test positive for Covid 19 in the 2 weeks post procedure, please call and report this information to [place].    If any biopsies were taken you will be contacted by phone or by letter within the next 1-3 weeks.  Please call us at [phone] if you have not heard about the biopsies in 3 weeks.    SIGNATURES/CONFIDENTIALITY: You and/or your care partner have signed paperwork which will be entered into your electronic medical record.  These signatures attest to the fact that that  the information above on your After Visit Summary has been reviewed and is understood.  Full responsibility of the confidentiality of this discharge information lies with you and/or your care-partner.

## 2019-01-25 NOTE — Progress Notes (Signed)
Called to room to assist during endoscopic procedure.  Patient ID and intended procedure confirmed with present staff. Received instructions for my participation in the procedure from the performing physician.  

## 2019-01-25 NOTE — Progress Notes (Signed)
To PACU, VSS. Report to [email or web address] 

## 2019-01-25 NOTE — Op Note (Signed)
[place] Patient Name: Jim Hunter Procedure Date: 01/25/2019 10:42 AM MRN: [id number] Endoscopist: Gatha Mayer , MD Age: 53 Referring MD:  Date of Birth: 1966/06/04 Gender: Male Account #: [id number] Procedure:                Colonoscopy Indications:              Screening for colorectal malignant neoplasm, This                            is the patient's first colonoscopy Medicines:                Propofol per Anesthesia, Monitored Anesthesia Care Procedure:                Pre-Anesthesia Assessment:                           - Prior to the procedure, a History and Physical                            was performed, and patient medications and                            allergies were reviewed. The patient's tolerance of                            previous anesthesia was also reviewed. The risks                            and benefits of the procedure and the sedation                            options and risks were discussed with the patient.                            All questions were answered, and informed consent                            was obtained. Prior Anticoagulants: The patient has                            taken no previous anticoagulant or antiplatelet                            agents. ASA Grade Assessment: II - A patient with                            mild systemic disease. After reviewing the risks                            and benefits, the patient was deemed in                            satisfactory condition to undergo the procedure.  After obtaining informed consent, the colonoscope                            was passed under direct vision. Throughout the                            procedure, the patient's blood pressure, pulse, and                            oxygen saturations were monitored continuously. The                            Colonoscope was introduced through the anus and   advanced to the the cecum, identified by                            appendiceal orifice and ileocecal valve. The                            colonoscopy was performed without difficulty. The                            patient tolerated the procedure well. The quality                            of the bowel preparation was good. The bowel                            preparation used was Miralax via split dose                            instruction. Scope In: 10:52:25 AM Scope Out: 11:06:01 AM Scope Withdrawal Time: 0 hours 12 minutes 16 seconds  Total Procedure Duration: 0 hours 13 minutes 36 seconds  Findings:                 The perianal and digital rectal examinations were                            normal. Pertinent negatives include normal prostate                            (size, shape, and consistency).                           A 2 mm polyp was found in the transverse colon. The                            polyp was sessile. The polyp was removed with a                            cold snare. Resection and retrieval were complete.                            Verification of patient  identification for the                            specimen was done. Estimated blood loss was minimal.                           The exam was otherwise without abnormality on                            direct and retroflexion views. Complications:            No immediate complications. Estimated Blood Loss:     Estimated blood loss was minimal. Impression:               - One 2 mm polyp in the transverse colon, removed                            with a cold snare. Resected and retrieved.                           - The examination was otherwise normal on direct                            and retroflexion views. Recommendation:           - Patient has a contact number available for                            emergencies. The signs and symptoms of potential                            delayed complications  were discussed with the                            patient. Return to normal activities tomorrow.                            Written discharge instructions were provided to the                            patient.                           - Resume previous diet.                           - Continue present medications.                           - Repeat colonoscopy is recommended. The                            colonoscopy date will be determined after pathology                            results from today's exam become available for  review. Gatha Mayer, MD 01/25/2019 11:10:28 AM This report has been signed electronically.

## 2019-01-28 ENCOUNTER — Telehealth: Payer: Self-pay | Admitting: *Deleted

## 2019-01-28 NOTE — Telephone Encounter (Signed)
First follow up call attempt.  Reached non-identifying answering machine.  NO message left.

## 2019-01-31 ENCOUNTER — Other Ambulatory Visit: Payer: Self-pay | Admitting: Family Medicine

## 2019-01-31 MED FILL — SILDENAFIL CITRATE 100 MG T: 100 | 30 days supply | Qty: 6 | Fill #0

## 2019-02-03 ENCOUNTER — Encounter: Payer: Self-pay | Admitting: Internal Medicine

## 2019-02-03 DIAGNOSIS — Z86010 Personal history of colonic polyps: Secondary | ICD-10-CM

## 2019-02-03 DIAGNOSIS — Z860101 Personal history of adenomatous and serrated colon polyps: Secondary | ICD-10-CM | POA: Insufficient documentation

## 2019-02-03 HISTORY — DX: Personal history of colonic polyps: Z86.010

## 2019-02-03 HISTORY — DX: Personal history of adenomatous and serrated colon polyps: Z86.0101

## 2019-02-03 NOTE — Progress Notes (Signed)
2 mm adenoma My Chart letter  7 year recall

## 2019-02-28 MED FILL — SILDENAFIL CITRATE 100 MG T: 100 | 20 days supply | Qty: 4 | Fill #1

## 2019-03-18 ENCOUNTER — Other Ambulatory Visit: Payer: Self-pay | Admitting: Family Medicine

## 2019-03-18 MED FILL — SILDENAFIL CITRATE 100 MG T: 100 | 30 days supply | Qty: 6 | Fill #0

## 2019-04-18 ENCOUNTER — Other Ambulatory Visit: Payer: Self-pay | Admitting: Family Medicine

## 2019-04-18 MED FILL — LISINOPRIL-HCTZ 20-12.5 MG: 20-12.5 | 90 days supply | Qty: 90 | Fill #0

## 2019-04-18 MED FILL — SILDENAFIL CITRATE 100 MG T: 100 | 20 days supply | Qty: 4 | Fill #1

## 2019-04-18 MED FILL — OMEPRAZOLE 20 MG CAPSULE DR: 20 | 90 days supply | Qty: 90 | Fill #1

## 2019-05-02 ENCOUNTER — Other Ambulatory Visit: Payer: Self-pay | Admitting: Family Medicine

## 2019-05-05 MED FILL — SILDENAFIL CITRATE 100 MG T: 100 | 30 days supply | Qty: 6 | Fill #0

## 2019-06-06 MED FILL — SILDENAFIL CITRATE 100 MG T: 100 | 20 days supply | Qty: 4 | Fill #1

## 2019-07-08 ENCOUNTER — Other Ambulatory Visit: Payer: Self-pay | Admitting: Family Medicine

## 2019-07-08 MED FILL — OMEPRAZOLE 20 MG CAPSULE DR: 20 | 90 days supply | Qty: 90 | Fill #0

## 2019-07-09 MED FILL — SILDENAFIL CITRATE 100 MG T: 100 | 30 days supply | Qty: 6 | Fill #0

## 2019-08-06 MED FILL — SILDENAFIL CITRATE 100 MG T: 100 | 20 days supply | Qty: 4 | Fill #1

## 2019-08-06 MED FILL — LISINOPRIL-HCTZ 20-12.5 MG: 20-12.5 | 90 days supply | Qty: 90 | Fill #1

## 2019-09-03 ENCOUNTER — Other Ambulatory Visit: Payer: Self-pay | Admitting: Family Medicine

## 2019-09-04 MED FILL — SILDENAFIL CITRATE 100 MG T: 100 | 20 days supply | Qty: 4 | Fill #0

## 2019-10-16 MED FILL — OMEPRAZOLE DR 20 MG CAPSULE: 20 | 90 days supply | Qty: 90 | Fill #1

## 2019-10-16 MED FILL — SILDENAFIL CITRATE 100 MG T: 100 | 20 days supply | Qty: 4 | Fill #1

## 2019-11-10 ENCOUNTER — Other Ambulatory Visit: Payer: Self-pay | Admitting: Family Medicine

## 2019-11-10 MED FILL — LISINOPRIL-HCTZ 20-12.5 MG: 20-12.5 | 90 days supply | Qty: 90 | Fill #2

## 2019-11-11 MED FILL — SILDENAFIL CITRATE 100 MG T: 100 | 30 days supply | Qty: 6 | Fill #0

## 2020-01-30 ENCOUNTER — Other Ambulatory Visit: Payer: Self-pay | Admitting: Family Medicine

## 2020-01-30 MED FILL — OMEPRAZOLE DR 20 MG CAPSULE: 20 | 90 days supply | Qty: 90 | Fill #0

## 2020-01-30 MED FILL — SILDENAFIL CITRATE 100 MG T: 100 | 10 days supply | Qty: 2 | Fill #1

## 2020-01-30 MED FILL — LISINOPRIL-HCTZ 20-12.5 MG: 20-12.5 | 90 days supply | Qty: 90 | Fill #3

## 2020-02-02 ENCOUNTER — Other Ambulatory Visit: Payer: Self-pay

## 2020-02-04 MED ORDER — SILDENAFIL CITRATE 100 MG PO TABS: ORAL_TABLET | ORAL | 1 refills | Status: DC

## 2020-02-09 MED FILL — SILDENAFIL CITRATE 100 MG T: 100 | 20 days supply | Qty: 4 | Fill #0

## 2020-04-02 MED FILL — SILDENAFIL CITRATE 100 MG T: 100 | 20 days supply | Qty: 4 | Fill #1

## 2020-05-13 ENCOUNTER — Other Ambulatory Visit: Payer: Self-pay | Admitting: Family Medicine

## 2020-05-13 MED FILL — SILDENAFIL CITRATE 100 MG T: 100 | 20 days supply | Qty: 4 | Fill #0

## 2020-05-13 MED FILL — OMEPRAZOLE DR 20 MG CAPSULE: 20 | 90 days supply | Qty: 90 | Fill #1

## 2020-05-13 MED FILL — LISINOPRIL-HCTZ 20-12.5 MG: 20-12.5 | 30 days supply | Qty: 30 | Fill #0

## 2020-05-13 NOTE — Telephone Encounter (Signed)
Please call pt to schedule an appointment.

## 2020-06-02 ENCOUNTER — Other Ambulatory Visit: Payer: Self-pay | Admitting: Family Medicine

## 2020-06-02 MED FILL — SILDENAFIL CITRATE 100 MG T: 100 | 50 days supply | Qty: 10 | Fill #0

## 2020-06-15 ENCOUNTER — Encounter: Payer: Self-pay | Admitting: Family Medicine

## 2020-06-15 ENCOUNTER — Other Ambulatory Visit: Payer: Self-pay

## 2020-06-15 ENCOUNTER — Ambulatory Visit (INDEPENDENT_AMBULATORY_CARE_PROVIDER_SITE_OTHER): Payer: No Typology Code available for payment source | Admitting: Family Medicine

## 2020-06-15 ENCOUNTER — Other Ambulatory Visit: Payer: Self-pay | Admitting: Family Medicine

## 2020-06-15 VITALS — BP 148/88 | HR 88 | Ht 69.0 in | Wt 260.0 lb

## 2020-06-15 DIAGNOSIS — R0600 Dyspnea, unspecified: Secondary | ICD-10-CM | POA: Diagnosis not present

## 2020-06-15 DIAGNOSIS — I10 Essential (primary) hypertension: Secondary | ICD-10-CM

## 2020-06-15 DIAGNOSIS — Z0000 Encounter for general adult medical examination without abnormal findings: Secondary | ICD-10-CM

## 2020-06-15 DIAGNOSIS — R7989 Other specified abnormal findings of blood chemistry: Secondary | ICD-10-CM

## 2020-06-15 DIAGNOSIS — R059 Cough, unspecified: Secondary | ICD-10-CM

## 2020-06-15 DIAGNOSIS — E785 Hyperlipidemia, unspecified: Secondary | ICD-10-CM | POA: Diagnosis not present

## 2020-06-15 DIAGNOSIS — R0609 Other forms of dyspnea: Secondary | ICD-10-CM

## 2020-06-15 MED ORDER — HYDROCHLOROTHIAZIDE 12.5 MG PO CAPS: 12.5000 mg | ORAL_CAPSULE | Freq: Every day | ORAL | 1 refills | Status: DC

## 2020-06-15 MED ORDER — LOSARTAN POTASSIUM 25 MG PO TABS: 25.0000 mg | ORAL_TABLET | Freq: Every day | ORAL | 3 refills | Status: DC

## 2020-06-15 MED FILL — LOSARTAN POTASSIUM 25 MG TA: 25 | 90 days supply | Qty: 90 | Fill #0

## 2020-06-15 MED FILL — HYDROCHLOROTHIAZIDE 12.5 MG: 12.5 | 30 days supply | Qty: 30 | Fill #0

## 2020-06-15 NOTE — Patient Instructions (Signed)
It was great seeing you today!  Please check-out at the front desk before leaving the clinic. I'd like to see you back on 06/29/20 at 1:55 PM as scheduled.   Visit Remembers: - Stop by the pharmacy to pick up your prescriptions  - Continue to work on your healthy eating habits and incorporating exercise into your daily life. - Your goal is to have an BP < 120/80  - Medicine Changes: STOP your current high blood pressure medications. Start Losartan and HCTZ pills.  Follow up with me as scheduled.  - To Do: Go to [facility] to get Chest X-ray.      Regarding lab work today:  Due to recent changes in healthcare laws, you may see the results of your imaging and laboratory studies on MyChart before your provider has had a chance to review them.  I understand that in some cases there may be results that are confusing or concerning to you. Not all laboratory results come back in the same time frame and you may be waiting for multiple results in order to interpret others.  Please give [place] 72 hours in order for your provider to thoroughly review all the results before contacting the office for clarification of your results. If everything is normal, you will get a letter in the mail or a message in My Chart. Please give [place] a call if you do not hear from [place] after 2 weeks.  Please bring all of your medications with you to each visit.    If you haven't already, sign up for My Chart to have easy access to your labs results, and communication with your primary care physician.  Feel free to call with any questions or concerns at any time, at [phone].   Take care,  Dr. [facility]

## 2020-06-15 NOTE — Assessment & Plan Note (Addendum)
BP 148/88.  Discontinue Lisinopril/HCTZ combo and start Losartan 25 mg and continue HCTZ 12.5 mg.  BMP today. Repeat at follow up.  - If BP at goal consider combo formulation at follow up

## 2020-06-15 NOTE — Assessment & Plan Note (Addendum)
DOE with stairs. Getting worse per patient. Normal cardiac and lung exam today. Of note patient has gained a significant amount of weight since his last visit. DOE could be 2/2 to exercise intolerance. Patient could have worsening sleep apnea. CXR ordered to help evaluate for lung mass and pneumonia.  CBC to assess for anemia. TSH for hypothyroidism. ECHO to assess for heart failure. Also obtain lipid panel and BMP.  - EKG at follow up

## 2020-06-15 NOTE — Progress Notes (Signed)
SUBJECTIVE:   Chief compliant/HPI: annual examination  Jim Hunter is a 54 y.o. who presents today for an annual exam.  Shortness of breath Patient states he "gets winded easily". Sx are worse with exertion. He can only take 1 flight of stairs before having to stop. Reports his sx started about 6 months ago. He is a former smoker (10 pack years, quit >15 years ago). Has hx of mild sleep apnea but states he was told that he did not have to wear a CPAP.  Of note, he has a persistent cough and bilateral flank pain that radiates midline. Denies resting chest discomfort.   History tabs reviewed.   Review of systems form reviewed and notable for DOE, cough, back pain, abdominal pain. No fevers.     OBJECTIVE:   BP (!) 148/88   Pulse 88   Ht 5\' 9"  (1.753 m)   Wt 260 lb (117.9 kg)   SpO2 97%   BMI 38.40 kg/m    GEN:     Pleasant, well appearing male and no distress    HENT:  mucus membranes moist, oropharyngeal without lesions or erythema,  nares patent, no nasal discharge EYES:   pupils equal and reactive NECK:  supple, normal ROM, no lymphadenopathy, no TMJ tenderness  RESP:  clear to auscultation bilaterally, no increased work of breathing, no wheezes, rales or rhonchi  CVS:   regular rate and rhythm, no murmur, distal pulses intact  ABD:  obese abdomen  EXT:   normal ROM, atraumatic, no LE edema, right knee with well healed surgical scar  BACK:   No midline C, T or L spine tenderness  NEURO:  speech normal, alert and oriented   Skin:   warm and dry, no rash, normal skin turgor Psych: Normal affect, appropriate speech and behavior    ASSESSMENT/PLAN:   Dyspnea DOE with stairs. Getting worse per patient. Normal cardiac and lung exam today. Of note patient has gained a significant amount of weight since his last visit. DOE could be 2/2 to exercise intolerance. Patient could have worsening sleep apnea. CXR ordered to help evaluate for lung mass and pneumonia.  CBC to assess  for anemia. TSH for hypothyroidism. ECHO to assess for heart failure. Also obtain lipid panel and BMP.  - EKG at follow up   Cough Possibly drug induced. Stop lisinopril and switch to Losartan. CXR to assess for acute pulmonary pathologies.   Hypertension BP 148/88.  Discontinue Lisinopril/HCTZ combo and start Losartan 25 mg and continue HCTZ 12.5 mg.  BMP today. Repeat at follow up.  - If BP at goal consider combo formulation at follow up     Annual Examination  See AVS for age appropriate recommendations.  PHQ score 0, reviewed and discussed.  Blood pressure value is not at goal, discussed.   Considered the following screening exams based upon USPSTF recommendations: Diabetes screening: discussed Screening for elevated cholesterol: ordered HIV testing: discussed Hepatitis C: discussed Hepatitis B: discussed Syphilis if at high risk: discussed Reviewed risk factors for latent tuberculosis and not indicated Colorectal cancer screening: up to date on screening for CRC. Lung cancer screening: discussed See documentation below regarding discussion and indication. Patient with 10 pack year and quit >15 years ago.  PSA discussed and after engaging in discussion of possible risks, benefits and complications of screening patient elected to not screen as he is asymptomatic.    Follow up 12/14 for DOE and HTN.    [name], [place]  [place]

## 2020-06-15 NOTE — Assessment & Plan Note (Signed)
Possibly drug induced. Stop lisinopril and switch to Losartan. CXR to assess for acute pulmonary pathologies.

## 2020-06-16 LAB — LIPID PANEL
Chol/HDL Ratio: 7.2 ratio — ABNORMAL HIGH (ref 0.0–5.0)
Cholesterol, Total: 224 mg/dL — ABNORMAL HIGH (ref 100–199)
HDL: 31 mg/dL — ABNORMAL LOW (ref >39)
LDL Chol Calc (NIH): 135 mg/dL — ABNORMAL HIGH (ref 0–99)
Triglycerides: 320 mg/dL — ABNORMAL HIGH (ref 0–149)
VLDL Cholesterol Cal: 58 mg/dL — ABNORMAL HIGH (ref 5–40)

## 2020-06-16 LAB — CBC
Hematocrit: 32.7 % — ABNORMAL LOW (ref 37.5–51.0)
Hemoglobin: 10.8 g/dL — ABNORMAL LOW (ref 13.0–17.7)
MCH: 25.9 pg — ABNORMAL LOW (ref 26.6–33.0)
MCHC: 33 g/dL (ref 31.5–35.7)
MCV: 78 fL — ABNORMAL LOW (ref 79–97)
Platelets: 269 10*3/uL (ref 150–450)
RBC: 4.17 x10E6/uL (ref 4.14–5.80)
RDW: 14.9 % (ref 11.6–15.4)
WBC: 7.4 10*3/uL (ref 3.4–10.8)

## 2020-06-16 LAB — BASIC METABOLIC PANEL
BUN/Creatinine Ratio: 22 — ABNORMAL HIGH (ref 9–20)
BUN: 23 mg/dL (ref 6–24)
CO2: 21 mmol/L (ref 20–29)
Calcium: 9.4 mg/dL (ref 8.7–10.2)
Chloride: 102 mmol/L (ref 96–106)
Creatinine, Ser: 1.06 mg/dL (ref 0.76–1.27)
GFR calc Af Amer: 91 mL/min/{1.73_m2} (ref >59)
GFR calc non Af Amer: 79 mL/min/{1.73_m2} (ref >59)
Glucose: 98 mg/dL (ref 65–99)
Potassium: 4.2 mmol/L (ref 3.5–5.2)
Sodium: 138 mmol/L (ref 134–144)

## 2020-06-16 LAB — TSH: TSH: 6.35 u[IU]/mL — ABNORMAL HIGH (ref 0.450–4.500)

## 2020-06-18 NOTE — Addendum Note (Signed)
Addended by: [name] D on: 06/18/2020 09:35 AM   Modules accepted: Orders

## 2020-06-29 ENCOUNTER — Ambulatory Visit (INDEPENDENT_AMBULATORY_CARE_PROVIDER_SITE_OTHER): Payer: No Typology Code available for payment source | Admitting: Family Medicine

## 2020-06-29 ENCOUNTER — Encounter: Payer: Self-pay | Admitting: Family Medicine

## 2020-06-29 ENCOUNTER — Other Ambulatory Visit: Payer: Self-pay

## 2020-06-29 VITALS — BP 126/80 | HR 85 | Wt 260.2 lb

## 2020-06-29 DIAGNOSIS — M25551 Pain in right hip: Secondary | ICD-10-CM

## 2020-06-29 DIAGNOSIS — M1711 Unilateral primary osteoarthritis, right knee: Secondary | ICD-10-CM

## 2020-06-29 DIAGNOSIS — R0600 Dyspnea, unspecified: Secondary | ICD-10-CM | POA: Diagnosis not present

## 2020-06-29 DIAGNOSIS — Z96651 Presence of right artificial knee joint: Secondary | ICD-10-CM | POA: Diagnosis not present

## 2020-06-29 DIAGNOSIS — R0609 Other forms of dyspnea: Secondary | ICD-10-CM

## 2020-06-29 DIAGNOSIS — E785 Hyperlipidemia, unspecified: Secondary | ICD-10-CM

## 2020-06-29 DIAGNOSIS — I10 Essential (primary) hypertension: Secondary | ICD-10-CM

## 2020-06-29 DIAGNOSIS — G8929 Other chronic pain: Secondary | ICD-10-CM

## 2020-06-29 MED ORDER — KETOROLAC TROMETHAMINE 30 MG/ML IJ SOLN
30.0000 mg | Freq: Once | INTRAMUSCULAR | Status: AC
  Administered 2020-06-29: 30 mg via INTRAMUSCULAR

## 2020-06-29 NOTE — Patient Instructions (Addendum)
It was great seeing you today!   I'd like to see you back 3 months but if you need to be seen earlier than that for any new issues we're happy to fit you in, just give [place] a call!  Stop by the hospital to get your chest xray.  Get your ECHO as scheduled.    For pain: Do not take more ibuprofen today.  Continue taking Tylenol for pain as discussed.    If you have questions or concerns please do not hesitate to call at [phone].  Dr. [facility]

## 2020-06-29 NOTE — Progress Notes (Signed)
   SUBJECTIVE:   CHIEF COMPLAINT / HPI:   Chief Complaint  Patient presents with  . Hypertension     Jim Hunter is a 54 y.o. male here for HTN follow-up.  Hypertension  He stopped taking lisinopril and cough has resolved.  Denies chest pain headaches, vision changes.  Endorses shortness of breath.  Exertional dyspnea Shortness of breath remains.  Denies chest pain, lower extremity edema, paroxysmal nighttime dyspnea.  He has not yet gotten his echo or chest x-ray.  Worse with walking upstairs.  Hip Pain  Chronic episodic pain ED gets flare ups.  Reports that he can sometimes limps and gets stiff. Reports constant sharp pain in his right hip that radiates to his back. Pt had right knee replacement several years ago.Taking Tylenol without relief.    PERTINENT  PMH / PSH: reviewed and updated as appropriate   OBJECTIVE:   BP 126/80   Pulse 85   Wt 260 lb 3.2 oz (118 kg)   SpO2 97%   BMI 38.42 kg/m   GEN:     Pleasant, well appearing male and no distress        RESP:  clear to auscultation bilaterally, no increased work of breathing, no wheezes, rales or rhonchi  CVS:     regular rate and rhythm, no murmur, distal pulses intact, no JVP  ABD:    obese abdomen, soft, non-tender  EXT:     normal ROM, atraumatic, right knee with well healed surgical scar, mild edema present, no joint line tenderness, no effusion, pain with extension, left knee normal, right hip non-tender to palpation, no midline T or L spine tenderness, gross sensation intact  NEURO:  speech normal, alert and oriented   Skin:    warm and dry, no rash, normal skin turgor   ASSESSMENT/PLAN:   Hyperlipidemia Patient intolerant to statins and Zetia.  PSK 9 expensive.  Discussed need for lifestyle modifications to gain better control of his hyperlipidemia. Reviewed last lipid panel (11/30) results with pt. Non fasting total cholesterol: 224, TGs 320, HDL 31, LDL 135     Osteoarthritis of right  knee Uncontrolled pain. Hx of total knee replacement has not followed up with orthopedic surgery in a while.  Referral placed for orthopedic surgery.  Toradol injection today.  Creatinine stable  Hypertension Cough resolved after stopping lisinopril.  BP at goal 126/80. Continue losartan 25 mg and HCTZ 25 mg.   Dyspnea Obtain echo and chest x-ray.  Echo scheduled for 12/22.  Patient to get chest x-ray in his downtime as he works at [employer].  Consider BNP at follow-up.  LDL greater than 130. CHF versus deconditioning.  Though he has gained significant weight in the past several months he is euvolemic on exam.   Hip pain, right Likely compensating for her right knee.  Will place for patient to follow-up with orthopedic surgery.  Toradol injection.  Creatinine stable     [name], DO PGY-2, [facility] 06/29/2020

## 2020-07-01 DIAGNOSIS — M25551 Pain in right hip: Secondary | ICD-10-CM | POA: Insufficient documentation

## 2020-07-01 NOTE — Assessment & Plan Note (Signed)
Cough resolved after stopping lisinopril.  BP at goal 126/80. Continue losartan 25 mg and HCTZ 25 mg.

## 2020-07-01 NOTE — Assessment & Plan Note (Signed)
Obtain echo and chest x-ray.  Echo scheduled for 12/22.  Patient to get chest x-ray in his downtime as he works at [employer].  Consider BNP at follow-up.  LDL greater than 130. CHF versus deconditioning.  Though he has gained significant weight in the past several months he is euvolemic on exam.

## 2020-07-01 NOTE — Assessment & Plan Note (Signed)
Likely compensating for her right knee.  Will place for patient to follow-up with orthopedic surgery.  Toradol injection.  Creatinine stable

## 2020-07-01 NOTE — Assessment & Plan Note (Addendum)
Patient intolerant to statins and Zetia.  PSK 9 expensive.  Discussed need for lifestyle modifications to gain better control of his hyperlipidemia. Reviewed last lipid panel (11/30) results with pt. Non fasting total cholesterol: 224, TGs 320, HDL 31, LDL 135

## 2020-07-01 NOTE — Assessment & Plan Note (Addendum)
Uncontrolled pain. Hx of total knee replacement has not followed up with orthopedic surgery in a while.  Referral placed for orthopedic surgery.  Toradol injection today.  Creatinine stable

## 2020-07-07 ENCOUNTER — Ambulatory Visit (HOSPITAL_COMMUNITY)
Admission: RE | Admit: 2020-07-07 | Discharge: 2020-07-07 | Disposition: A | Discharge status: Home / Self Care | Payer: No Typology Code available for payment source | Source: Ambulatory Visit | Attending: Family Medicine | Admitting: Family Medicine

## 2020-07-07 ENCOUNTER — Other Ambulatory Visit: Payer: Self-pay

## 2020-07-07 DIAGNOSIS — I10 Essential (primary) hypertension: Secondary | ICD-10-CM | POA: Diagnosis not present

## 2020-07-07 DIAGNOSIS — E785 Hyperlipidemia, unspecified: Secondary | ICD-10-CM | POA: Diagnosis not present

## 2020-07-07 DIAGNOSIS — G4730 Sleep apnea, unspecified: Secondary | ICD-10-CM | POA: Insufficient documentation

## 2020-07-07 DIAGNOSIS — R0600 Dyspnea, unspecified: Secondary | ICD-10-CM | POA: Insufficient documentation

## 2020-07-07 DIAGNOSIS — R0609 Other forms of dyspnea: Secondary | ICD-10-CM

## 2020-07-07 LAB — ECHOCARDIOGRAM COMPLETE
Area-P 1/2: 2.48 cm2
S' Lateral: 3.5 cm

## 2020-07-07 NOTE — Progress Notes (Signed)
Echocardiogram 2D Echocardiogram has been performed.  Jim Hunter 07/07/2020, 8:57 AM

## 2020-07-15 ENCOUNTER — Other Ambulatory Visit: Payer: Self-pay | Admitting: Family Medicine

## 2020-07-15 ENCOUNTER — Encounter: Payer: Self-pay | Admitting: Family Medicine

## 2020-07-15 DIAGNOSIS — I10 Essential (primary) hypertension: Secondary | ICD-10-CM

## 2020-07-15 MED ORDER — AMLODIPINE BESYLATE 5 MG PO TABS: 5.0000 mg | ORAL_TABLET | Freq: Every day | ORAL | 3 refills | Status: DC

## 2020-07-15 MED FILL — AMLODIPINE BESYLATE 5 MG TA: 5 | 90 days supply | Qty: 90 | Fill #0

## 2020-07-15 NOTE — Telephone Encounter (Signed)
Called pt to discuss myalgias.  Myalgias are a rare side effect of Losartan. Pt to stop Losartan. Rx for 5 mg amlodipine sent to pharmacy. Continue HCTZ.   ECHO and CXR were unremarkable. SOB likely 2/2 to deconditioning.   Katha Cabal, DO PGY-2, [facility] 07/15/2020 11:57 AM

## 2020-08-03 ENCOUNTER — Other Ambulatory Visit: Payer: Self-pay | Admitting: Family Medicine

## 2020-08-03 MED FILL — OMEPRAZOLE DR 20 MG CAPSULE: 20 | 90 days supply | Qty: 90 | Fill #2

## 2020-08-04 ENCOUNTER — Other Ambulatory Visit: Payer: Self-pay | Admitting: Family Medicine

## 2020-08-04 ENCOUNTER — Encounter: Payer: Self-pay | Admitting: Family Medicine

## 2020-08-04 MED FILL — SILDENAFIL CITRATE 100 MG T: 100 | 50 days supply | Qty: 10 | Fill #0

## 2020-08-06 ENCOUNTER — Other Ambulatory Visit: Payer: Self-pay

## 2020-08-06 ENCOUNTER — Ambulatory Visit (INDEPENDENT_AMBULATORY_CARE_PROVIDER_SITE_OTHER): Payer: No Typology Code available for payment source | Admitting: Family Medicine

## 2020-08-06 ENCOUNTER — Other Ambulatory Visit: Payer: Self-pay | Admitting: Family Medicine

## 2020-08-06 ENCOUNTER — Ambulatory Visit (HOSPITAL_COMMUNITY)
Admission: RE | Admit: 2020-08-06 | Discharge: 2020-08-06 | Disposition: A | Discharge status: Home / Self Care | Payer: No Typology Code available for payment source | Source: Ambulatory Visit | Attending: Family Medicine | Admitting: Family Medicine

## 2020-08-06 VITALS — BP 136/94 | HR 87 | Wt 263.8 lb

## 2020-08-06 DIAGNOSIS — R7309 Other abnormal glucose: Secondary | ICD-10-CM | POA: Diagnosis not present

## 2020-08-06 DIAGNOSIS — M5442 Lumbago with sciatica, left side: Secondary | ICD-10-CM

## 2020-08-06 DIAGNOSIS — R0609 Other forms of dyspnea: Secondary | ICD-10-CM

## 2020-08-06 DIAGNOSIS — G8929 Other chronic pain: Secondary | ICD-10-CM | POA: Insufficient documentation

## 2020-08-06 DIAGNOSIS — G4733 Obstructive sleep apnea (adult) (pediatric): Secondary | ICD-10-CM

## 2020-08-06 DIAGNOSIS — M5441 Lumbago with sciatica, right side: Secondary | ICD-10-CM

## 2020-08-06 DIAGNOSIS — M5136 Other intervertebral disc degeneration, lumbar region: Secondary | ICD-10-CM | POA: Insufficient documentation

## 2020-08-06 DIAGNOSIS — R0600 Dyspnea, unspecified: Secondary | ICD-10-CM | POA: Diagnosis not present

## 2020-08-06 DIAGNOSIS — M791 Myalgia, unspecified site: Secondary | ICD-10-CM

## 2020-08-06 DIAGNOSIS — I10 Essential (primary) hypertension: Secondary | ICD-10-CM

## 2020-08-06 DIAGNOSIS — T391X1A Poisoning by 4-Aminophenol derivatives, accidental (unintentional), initial encounter: Secondary | ICD-10-CM | POA: Diagnosis not present

## 2020-08-06 DIAGNOSIS — D509 Iron deficiency anemia, unspecified: Secondary | ICD-10-CM

## 2020-08-06 DIAGNOSIS — K219 Gastro-esophageal reflux disease without esophagitis: Secondary | ICD-10-CM

## 2020-08-06 DIAGNOSIS — E669 Obesity, unspecified: Secondary | ICD-10-CM

## 2020-08-06 LAB — POCT GLYCOSYLATED HEMOGLOBIN (HGB A1C): Hemoglobin A1C: 5.7 % — AB (ref 4.0–5.6)

## 2020-08-06 MED ORDER — FAMOTIDINE 20 MG PO TABS: 20.0000 mg | ORAL_TABLET | Freq: Two times a day (BID) | ORAL | 1 refills | Status: DC

## 2020-08-06 MED FILL — FAMOTIDINE 20 MG TABS: 20 | 15 days supply | Qty: 30 | Fill #0

## 2020-08-06 NOTE — Assessment & Plan Note (Signed)
Stable.  Stop omeprazole.  Start famotidine.

## 2020-08-06 NOTE — Assessment & Plan Note (Signed)
Weight gain of about 30 pounds since July.  Patient is more sedentary on his new job.  Discussed need for weight loss.  Patient has reduced exercise tolerance due to his ongoing right knee and back pain.

## 2020-08-06 NOTE — Assessment & Plan Note (Signed)
Patient states he has been taken 1500 to 2000 mg of Tylenol 4 times a day.  Will obtain Tylenol level, CMP.  Patient advised to refrain from using Tylenol until his lab work returns.

## 2020-08-06 NOTE — Assessment & Plan Note (Addendum)
BP 136/94.  Discontinue losartan.  Unsure if he is taking HCTZ and/or amlodipine.  Patient to call with updated  list of his pill bottles later today.  It is unusual for amlodipine to [place] myalgias.  Losartan was stopped for the possibility of the rare side effect of myalgias.  Unsure if patient has had a adequate washout period since he reports stopped taking the losartan last week.  Obtain CK, CMP.

## 2020-08-06 NOTE — Progress Notes (Signed)
SUBJECTIVE:   CHIEF COMPLAINT / HPI:   Chief Complaint  Patient presents with  . Hypertension     Jim Hunter is a 55 y.o. male here for follow-up.    HTN Reports myalgias with his antihypertensive medications therefore he has stopped them.  Continues to have dyspnea on exertion.  Denies chest discomfort, headaches and vision changes.  He has been cutting salt out of his diet.  He is unsure whether he is started the amlodipine that was started.  States that he just stopped the losartan last week.    DOE Continues to have dyspnea on exertion.  Denies chest discomfort.  Of note, gained 30 pounds since July.  States that his activity level has decreased as he becomes short of breath with walking upstairs.  Has had a productive cough.  No fevers, nasal congestion recent sick contacts.  Back pain Patient with chronic back pain.  Complains of bilateral lower back pain that is worse with bending forward, sitting and standing for long periods of time.  Has to push up from chair.  Has history of discectomy with Dr. Patrice Paradise.  Has been taking 1500 to 2000 mg Tylenol 4 times a day for pain.  Toradol injection helped somewhat previously.  Previously followed with PM&R.  Feels as if physical therapy never helped him.  Does not want to be on strong opioids again as he fears addiction.   PERTINENT  PMH / PSH: reviewed and updated as appropriate   OBJECTIVE:   BP (!) 136/94   Pulse 87   Wt 263 lb 12.8 oz (119.7 kg)   SpO2 96%   BMI 38.96 kg/m    GEN: Well-appearing male, in no acute distress  CV: regular rate and rhythm, no murmurs appreciated  RESP: no increased work of breathing, clear to ascultation bilaterally  MSK: trace lower extremity edema,  BACL: Lumbar spine: - Inspection: no gross deformity or asymmetry, swelling or ecchymosis. No skin changes - Palpation: TTP over the spinous processes T12 - L4, hypertonic paraspinal muscles right> left, no SI joint tenderness - ROM: full  active ROM of the lumbar spine in flexion and extension, limited rotation secondary to pain - Strength: 5/5 strength of lower extremites  - Neuro: sensation intact in the L4-S1 nerve root distribution b/l SKIN: warm, dry    ASSESSMENT/PLAN:   Hypertension BP 136/94.  Discontinue losartan.  Unsure if he is taking HCTZ and/or amlodipine.  Patient to call with updated  list of his pill bottles later today.  It is unusual for amlodipine to K[place]s myalgias.  Losartan was stopped for the possibility of the rare side effect of myalgias.  Unsure if patient has had a adequate washout period since he reports stopped taking the losartan last week.  Obtain CK, CMP.    OSA (obstructive sleep apnea) Repeat sleep study.  Patient reports not qualifying for CPAP previously.    GERD (gastroesophageal reflux disease) Stable.  Stop omeprazole.  Start famotidine.  Obesity (BMI 30-39.9) Weight gain of about 30 pounds since July.  Patient is more sedentary on his new job.  Discussed need for weight loss.  Patient has reduced exercise tolerance due to his ongoing right knee and back pain.    Dyspnea Reviewed previous echo and chest x-ray which were grossly unremarkable.  Patient not on a statin due to intolerance.  Question whether the patient's dyspnea is related to angina.  Possibly secondary to sedentary lifestyle as he has gained 30 pounds in the past  6 months. May benefit from cardiology referral for CAD workup. Sleep study ordered.  On chart review, microcytic anemia is new.  Colonoscopy was performed in 2020.  Obtain: CBC, Ferritin, TSH, CMP  Unintentional Tylenol overdose, initial encounter Patient states he has been taken 1500 to 2000 mg of Tylenol 4 times a day.  Will obtain Tylenol level, CMP.  Patient advised to refrain from using Tylenol until his lab work returns.       [name], DO PGY-2, [facility] 08/06/2020

## 2020-08-06 NOTE — Assessment & Plan Note (Addendum)
Repeat sleep study.  Patient reports not qualifying for CPAP previously.

## 2020-08-06 NOTE — Patient Instructions (Addendum)
It was great seeing you today!  Please check-out at the front desk before leaving the clinic. I'd like to see you back in 4  weeks but if you need to be seen earlier than that for any new issues we're happy to fit you in, just give [place] a call!  Visit Remembers: - Stop by the pharmacy to pick up your prescriptions  - Continue to work on your healthy eating habits and incorporating exercise into your daily life.  - Your goal is to have an BP < 120/80 - Medicine Changes: Stop taking Omeprazole and switch to Famotidine twice daily.  - DO NOT Take Tylenol until you here from me regarding your lab results.  - To Do: Let me know if you are trully taking Amlodipine.     Regarding lab work today:  Due to recent changes in healthcare laws, you may see the results of your imaging and laboratory studies on MyChart before your provider has had a chance to review them.  I understand that in some cases there may be results that are confusing or concerning to you. Not all laboratory results come back in the same time frame and you may be waiting for multiple results in order to interpret others.  Please give [place] 72 hours in order for your provider to thoroughly review all the results before contacting the office for clarification of your results. If everything is normal, you will get a letter in the mail or a message in My Chart. Please give [place] a call if you do not hear from [place] after 2 weeks.  Please bring all of your medications with you to each visit.    If you haven't already, sign up for My Chart to have easy access to your labs results, and communication with your primary care physician.  Feel free to call with any questions or concerns at any time, at [phone].   Take care,  Dr. Rushie [facility]

## 2020-08-06 NOTE — Assessment & Plan Note (Addendum)
Reviewed previous echo and chest x-ray which were grossly unremarkable.  Patient not on a statin due to intolerance.  Question whether the patient's dyspnea is related to angina.  Possibly secondary to sedentary lifestyle as he has gained 30 pounds in the past 6 months. May benefit from cardiology referral for CAD workup. Sleep study ordered.  On chart review, microcytic anemia is new.  Colonoscopy was performed in 2020.  Obtain: CBC, Ferritin, TSH, CMP

## 2020-08-09 LAB — COMPREHENSIVE METABOLIC PANEL
ALT: 41 IU/L (ref 0–44)
AST: 37 IU/L (ref 0–40)
Albumin/Globulin Ratio: 1.6 (ref 1.2–2.2)
Albumin: 4.3 g/dL (ref 3.8–4.9)
Alkaline Phosphatase: 75 IU/L (ref 44–121)
BUN/Creatinine Ratio: 18 (ref 9–20)
BUN: 19 mg/dL (ref 6–24)
Bilirubin Total: 0.3 mg/dL (ref 0.0–1.2)
CO2: 20 mmol/L (ref 20–29)
Calcium: 9.4 mg/dL (ref 8.7–10.2)
Chloride: 103 mmol/L (ref 96–106)
Creatinine, Ser: 1.08 mg/dL (ref 0.76–1.27)
GFR calc Af Amer: 89 mL/min/{1.73_m2} (ref >59)
GFR calc non Af Amer: 77 mL/min/{1.73_m2} (ref >59)
Globulin, Total: 2.7 g/dL (ref 1.5–4.5)
Glucose: 104 mg/dL — ABNORMAL HIGH (ref 65–99)
Potassium: 4.3 mmol/L (ref 3.5–5.2)
Sodium: 139 mmol/L (ref 134–144)
Total Protein: 7 g/dL (ref 6.0–8.5)

## 2020-08-09 LAB — CBC
Hematocrit: 34.2 % — ABNORMAL LOW (ref 37.5–51.0)
Hemoglobin: 11 g/dL — ABNORMAL LOW (ref 13.0–17.7)
MCH: 24.9 pg — ABNORMAL LOW (ref 26.6–33.0)
MCHC: 32.2 g/dL (ref 31.5–35.7)
MCV: 77 fL — ABNORMAL LOW (ref 79–97)
Platelets: 331 10*3/uL (ref 150–450)
RBC: 4.42 x10E6/uL (ref 4.14–5.80)
RDW: 15.8 % — ABNORMAL HIGH (ref 11.6–15.4)
WBC: 6 10*3/uL (ref 3.4–10.8)

## 2020-08-09 LAB — ACETAMINOPHEN LEVEL: Acetaminophen (Tylenol), S: NOT DETECTED ug/mL (ref 10–30)

## 2020-08-09 LAB — CK: Total CK: 380 U/L — ABNORMAL HIGH (ref 41–331)

## 2020-08-09 LAB — TSH: TSH: 3.7 u[IU]/mL (ref 0.450–4.500)

## 2020-08-09 LAB — BRAIN NATRIURETIC PEPTIDE: BNP: 16.1 pg/mL (ref 0.0–100.0)

## 2020-08-09 LAB — FERRITIN: Ferritin: 13 ng/mL — ABNORMAL LOW (ref 30–400)

## 2020-08-10 ENCOUNTER — Encounter: Payer: Self-pay | Admitting: Family Medicine

## 2020-08-30 ENCOUNTER — Encounter: Payer: Self-pay | Admitting: Family Medicine

## 2020-09-08 ENCOUNTER — Ambulatory Visit: Payer: No Typology Code available for payment source | Admitting: Family Medicine

## 2020-09-08 NOTE — Progress Notes (Signed)
   SUBJECTIVE:   CHIEF COMPLAINT / HPI:   Chief Complaint  Patient presents with  . Follow-up  . Back Pain     Jim Hunter is a 55 y.o. male here for chronic pain follow up.   Pt reports continued lower back and buttock pain that radiates left leg. Described as a constant ache that can make him scream. Rated "20" /10. Sharp pain in his hip. Nothing seems to trigger the spasms. Uses a cart as a walker that kinda helps. Sitting on the toilet for long periods of time hurts.    Patient had back surgery in 2006 and a total right knee replacement in 2010.     GERD States famotidine helped a lot but he has run out of this medication. Feels like his reflux hurts worse when he burps.    Anemia No blood in stool and urine. No nose bleeds. Colonoscopy UTD. Pt has fatigue and DOE.   DOE Reports inability to walk 1 flight of stairs without being short of breath.  No change in sx. Denies chest pain.   PERTINENT  PMH / PSH: reviewed and updated as appropriate   OBJECTIVE:   BP (!) 148/107   Pulse 84   Ht 5\' 9"  (1.753 m)   Wt 264 lb (119.7 kg)   SpO2 99%   BMI 38.99 kg/m    GEN: pleasant older male, feels uncomfortable CV: regular rate and rhythm RESP: no increased work of breathing, clear to ascultation bilaterally MSK: Lumbar spine: - Inspection: no gross deformity or asymmetry, swelling or ecchymosis. No skin changes - Palpation: Not TTP SI joints b/l, TTP bilateral lumbar paraspinals, TTP L4 spinous process  - ROM: limited active ROM 2/2 to pain - Strength: 5/5 strength of lower extremity in L4-S1 nerve root distributions b/l - Neuro: sensation intact in the L4-S1 nerve root distribution - Special testing: Positive straight leg raise - Antalgic gait, multiple spasms on exam  SKIN: warm, dry NEURO: grossly normal, moves all extremities appropriately    ASSESSMENT/PLAN:   GERD (gastroesophageal reflux disease) Famotidine 20 mg twice daily.  Discussed avoidance of  triggers and medication compliance.   DOE (dyspnea on exertion) Work-up thus far unremarkable.  Anemia, deconditions 2/2 obesity equally likely however this could also be a anginal equivalent. Referral placed for cardiology for stress test.  Microcytic anemia Colonoscopy in 2020. Repeat CBC today. Given hx of GERD will likely need an EGD to evaluate for an ulcer.   Chronic bilateral low back pain with bilateral sciatica Radiculopathy.  Multimodal approach for pain: Tylenol 3 times a day, Volaren gel (Diclogenac gel) up to 4 times a day, baclofen three times a day and Gabapentin 3 times a day. Follow up in 4 weeks. Consider imaging.      [name], DO PGY-2, [facility] 09/10/2020

## 2020-09-10 ENCOUNTER — Encounter: Payer: Self-pay | Admitting: Family Medicine

## 2020-09-10 ENCOUNTER — Other Ambulatory Visit: Payer: Self-pay

## 2020-09-10 ENCOUNTER — Other Ambulatory Visit: Payer: Self-pay | Admitting: Family Medicine

## 2020-09-10 ENCOUNTER — Ambulatory Visit (INDEPENDENT_AMBULATORY_CARE_PROVIDER_SITE_OTHER): Payer: No Typology Code available for payment source | Admitting: Family Medicine

## 2020-09-10 VITALS — BP 148/107 | HR 84 | Ht 69.0 in | Wt 264.0 lb

## 2020-09-10 DIAGNOSIS — D509 Iron deficiency anemia, unspecified: Secondary | ICD-10-CM | POA: Diagnosis not present

## 2020-09-10 DIAGNOSIS — M5442 Lumbago with sciatica, left side: Secondary | ICD-10-CM | POA: Diagnosis not present

## 2020-09-10 DIAGNOSIS — R0600 Dyspnea, unspecified: Secondary | ICD-10-CM | POA: Diagnosis not present

## 2020-09-10 DIAGNOSIS — Z96651 Presence of right artificial knee joint: Secondary | ICD-10-CM

## 2020-09-10 DIAGNOSIS — G8929 Other chronic pain: Secondary | ICD-10-CM

## 2020-09-10 DIAGNOSIS — K219 Gastro-esophageal reflux disease without esophagitis: Secondary | ICD-10-CM

## 2020-09-10 DIAGNOSIS — M5441 Lumbago with sciatica, right side: Secondary | ICD-10-CM

## 2020-09-10 DIAGNOSIS — R0609 Other forms of dyspnea: Secondary | ICD-10-CM

## 2020-09-10 MED ORDER — FAMOTIDINE 20 MG PO TABS: 20.0000 mg | ORAL_TABLET | Freq: Two times a day (BID) | ORAL | 2 refills | Status: DC

## 2020-09-10 MED ORDER — GABAPENTIN 100 MG PO CAPS: 100.0000 mg | ORAL_CAPSULE | Freq: Three times a day (TID) | ORAL | 3 refills | Status: DC

## 2020-09-10 MED ORDER — BACLOFEN 5 MG PO TABS
5.0000 mg | ORAL_TABLET | Freq: Three times a day (TID) | ORAL | 1 refills | Status: DC
Start: 2020-09-10 — End: 2020-10-11

## 2020-09-10 MED ORDER — DICLOFENAC SODIUM 1 % EX GEL: 2.0000 g | Freq: Four times a day (QID) | CUTANEOUS | 1 refills | Status: DC

## 2020-09-10 MED FILL — FAMOTIDINE 20 MG TABS: 20 | 30 days supply | Qty: 60 | Fill #0

## 2020-09-10 MED FILL — DICLOFENAC SODIUM 1 % GEL: 1 | 37 days supply | Qty: 300 | Fill #0

## 2020-09-10 MED FILL — BACLOFEN 5 MG TABS: 5 | 30 days supply | Qty: 90 | Fill #0

## 2020-09-10 MED FILL — GABAPENTIN 100 MG CAPSULE: 100 | 30 days supply | Qty: 90 | Fill #0

## 2020-09-10 NOTE — Assessment & Plan Note (Signed)
Radiculopathy.  Multimodal approach for pain: Tylenol 3 times a day, Volaren gel (Diclogenac gel) up to 4 times a day, baclofen three times a day and Gabapentin 3 times a day. Follow up in 4 weeks. Consider imaging.

## 2020-09-10 NOTE — Assessment & Plan Note (Signed)
Work-up thus far unremarkable.  Anemia, deconditions 2/2 obesity equally likely however this could also be a anginal equivalent. Referral placed for cardiology for stress test.

## 2020-09-10 NOTE — Assessment & Plan Note (Signed)
Famotidine 20 mg twice daily.  Discussed avoidance of triggers and medication compliance.

## 2020-09-10 NOTE — Patient Instructions (Addendum)
It was great seeing you today! Your pain is likely due to spinal stenosis and nerve pain.   For pain: tylenol 3 times a day, Volaren gel (Diclogenac gel) up to 4 times a day, take the muscle relaxer (baclofen) up to three times a day and take Gabapentin 3 times a day.    For acid reflux: Tale famotidine twice a day. AVOID ibuprofen, Advil and naproxen.   We also checked your blood for anemia.    I'd like to see you back 2-4 weeks if your pain does not improve but if you need to be seen earlier than that for any new issues we're happy to fit you in, just give [place] a call!    Take care,   [employer] neurological surgery (7th ed., pp. [phone]). [place], [place]: Elsevier."> Leslie Dales and Winn neurological surgery (7th ed., pp. [phone]). [place], [place]: Elsevier.">  Spinal Stenosis  Spinal stenosis is a condition that happens when the spinal canal narrows. The spinal canal is the space between the bones of your spine (vertebrae). This narrowing puts pressure on the spinal cord or nerves. Spinal stenosis can affect the vertebrae in the neck, upper back, and lower back. This condition can range from mild to severe. In some cases, there are no symptoms. What are the causes? This condition is caused by areas of bone pushing into the spinal canal. This condition may be present at birth (congenital), or it may be caused by:  Slow breakdown of your vertebrae (spinal degeneration). This usually starts around 56 years of age.  Injury (trauma) to your spine.  Tumors in your spine.  Calcium deposits in your spine. What increases the risk? The following factors may make you more likely to develop this condition:  Being older than age 14.  Having a problem present at birth with an abnormally shaped spine (congenitalspinal deformity), such as scoliosis.  Having arthritis. What are the signs or symptoms? Symptoms of this condition include:  Pain in the neck  or back that is generally worse with activities, particularly when you stand or walk.  Numbness, tingling, hot or cold sensations, weakness, or tiredness (fatigue) in your leg or legs.  Pain going from the buttock, down the thigh, and to the calf (sciatica). This can happen in one or both legs.  Frequent episodes of falling.  A foot-slapping gait that leads to muscle weakness. In more severe cases, you may develop:  Problems having a bowel movement or urinating.  Difficulty having sex.  Loss of feeling in your legs and inability to walk. Symptoms may come on slowly and get worse over time. In some cases, there are no symptoms. How is this diagnosed? This condition is diagnosed based on your medical history and a physical exam. You will also have tests, such as an MRI, a CT scan, or an X-ray. How is this treated? Treatment for this condition often focuses on managing your pain and any other symptoms. Treatment may include:  Practicing good posture to lessen pressure on your nerves.  Exercising to strengthen muscles, build endurance, improve balance, and maintain range of motion. This may include physical therapy to restore movement and strength to your back.  Losing weight, if needed.  Medicines to reduce inflammation or pain. This may include a medicine that is injected into your spine (steroidinjection).  Assistive devices, such as a corset or brace. In some cases, surgery may be needed. The most common procedure is decompression laminectomy. This  is done to remove excess bone that puts pressure on your nerve roots. Follow these instructions at home: Managing pain, stiffness, and swelling  Practice good posture. If you were given a brace or a corset, wear it as told by your health care provider.  Maintain a healthy weight. Talk with your health care provider if you need help losing weight.  If directed, apply heat to the affected area as often as told by your health care  provider. Use the heat source that your health care provider recommends, such as a moist heat pack or a heating pad. ? Place a towel between your skin and the heat source. ? Leave the heat on for 20-30 minutes. ? Remove the heat if your skin turns bright red. This is especially important if you are unable to feel pain, heat, or cold. You may have a greater risk of getting burned.   Activity  Do all exercises and stretches as told by your health care provider.  Do not do any activities that cause pain. Ask your health care provider what activities are safe for you.  Do not lift anything that is heavier than 10 lb (4.5 kg), or the limit that you are told by your health care provider.  Return to your normal activities as told by your health care provider. Ask your health care provider what activities are safe for you. General instructions  Take over-the-counter and prescription medicines only as told by your health care provider.  Do not use any products that contain nicotine or tobacco, such as cigarettes, e-cigarettes, and chewing tobacco. If you need help quitting, ask your health care provider.  Eat a healthy diet. This includes plenty of fruits and vegetables, whole grains, and low-fat (lean) protein.  Keep all follow-up visits as told by your health care provider. This is important. Contact a health care provider if:  Your symptoms do not get better or they get worse.  You have a fever. Get help right away if:  You have new pain or symptoms of severe pain, such as: ? New or worsening pain in your neck or upper back. ? Severe pain that cannot be controlled with medicines. ? A severe headache that gets worse when you stand.  You are dizzy.  You have vision problems, such as blurred vision or double vision.  You have nausea or you vomit.  You develop new or worsening numbness or tingling in your back or legs.  You have pain, redness, swelling, or warmth in your arm or  leg. Summary  Spinal stenosis is a condition that happens when the spinal canal narrows. The spinal canal is the space between the bones of your spine (vertebrae). This narrowing puts pressure on the spinal cord or nerves.  This condition may be caused by a birth defect, breakdown of your vertebrae, trauma, tumors, or calcium deposits.  Spinal stenosis can cause numbness, weakness, or pain in the buttocks, neck, back, and legs.  This condition is usually diagnosed with your medical history, a physical exam, and tests, such as an MRI, a CT scan, or an X-ray. This information is not intended to replace advice given to you by your health care provider. Make sure you discuss any questions you have with your health care provider. Document Revised: 05/01/2019 Document Reviewed: 05/01/2019 Elsevier Patient Education  2021 [employer].

## 2020-09-10 NOTE — Assessment & Plan Note (Signed)
Colonoscopy in 2020. Repeat CBC today. Given hx of GERD will likely need an EGD to evaluate for an ulcer.

## 2020-09-11 LAB — CBC
Hematocrit: 39 % (ref 37.5–51.0)
Hemoglobin: 12.5 g/dL — ABNORMAL LOW (ref 13.0–17.7)
MCH: 26.2 pg — ABNORMAL LOW (ref 26.6–33.0)
MCHC: 32.1 g/dL (ref 31.5–35.7)
MCV: 82 fL (ref 79–97)
Platelets: 301 10*3/uL (ref 150–450)
RBC: 4.78 x10E6/uL (ref 4.14–5.80)
RDW: 20.8 % — ABNORMAL HIGH (ref 11.6–15.4)
WBC: 6.5 10*3/uL (ref 3.4–10.8)

## 2020-09-27 ENCOUNTER — Encounter: Payer: Self-pay | Admitting: Family Medicine

## 2020-09-29 ENCOUNTER — Encounter: Payer: Self-pay | Admitting: Family Medicine

## 2020-10-04 ENCOUNTER — Ambulatory Visit: Payer: No Typology Code available for payment source | Admitting: Family Medicine

## 2020-10-11 ENCOUNTER — Other Ambulatory Visit: Payer: Self-pay | Admitting: Family Medicine

## 2020-10-11 ENCOUNTER — Encounter: Payer: Self-pay | Admitting: Family Medicine

## 2020-10-11 ENCOUNTER — Other Ambulatory Visit: Payer: Self-pay

## 2020-10-11 ENCOUNTER — Ambulatory Visit (INDEPENDENT_AMBULATORY_CARE_PROVIDER_SITE_OTHER): Payer: No Typology Code available for payment source | Admitting: Family Medicine

## 2020-10-11 VITALS — BP 142/118 | HR 88 | Ht 69.0 in | Wt 260.8 lb

## 2020-10-11 DIAGNOSIS — Z96651 Presence of right artificial knee joint: Secondary | ICD-10-CM

## 2020-10-11 DIAGNOSIS — M5442 Lumbago with sciatica, left side: Secondary | ICD-10-CM

## 2020-10-11 DIAGNOSIS — M5441 Lumbago with sciatica, right side: Secondary | ICD-10-CM

## 2020-10-11 DIAGNOSIS — K219 Gastro-esophageal reflux disease without esophagitis: Secondary | ICD-10-CM

## 2020-10-11 DIAGNOSIS — I10 Essential (primary) hypertension: Secondary | ICD-10-CM | POA: Diagnosis not present

## 2020-10-11 DIAGNOSIS — G8929 Other chronic pain: Secondary | ICD-10-CM

## 2020-10-11 MED ORDER — HYDROCHLOROTHIAZIDE 12.5 MG PO CAPS: 12.5000 mg | ORAL_CAPSULE | Freq: Every day | ORAL | 1 refills | Status: DC

## 2020-10-11 MED ORDER — GABAPENTIN 100 MG PO CAPS: 100.0000 mg | ORAL_CAPSULE | Freq: Three times a day (TID) | ORAL | 3 refills | Status: DC

## 2020-10-11 MED ORDER — BACLOFEN 20 MG PO TABS: 20.0000 mg | ORAL_TABLET | Freq: Three times a day (TID) | ORAL | 0 refills | Status: DC

## 2020-10-11 MED ORDER — ESOMEPRAZOLE MAGNESIUM 40 MG PO CPDR
40.0000 mg | DELAYED_RELEASE_CAPSULE | Freq: Every day | ORAL | 3 refills | Status: DC
Start: 2020-10-11 — End: 2020-10-11

## 2020-10-11 MED FILL — BACLOFEN 20 MG TABS: 20 | 10 days supply | Qty: 30 | Fill #0

## 2020-10-11 MED FILL — ESOMEPRAZOLE MAG DR 40 MG C: 40 | 30 days supply | Qty: 30 | Fill #0

## 2020-10-11 MED FILL — GABAPENTIN 100 MG CAPSULE: 100 | 30 days supply | Qty: 90 | Fill #0

## 2020-10-11 MED FILL — HYDROCHLOROTHIAZIDE 12.5 MG: 12.5 | 30 days supply | Qty: 30 | Fill #0

## 2020-10-11 NOTE — Patient Instructions (Addendum)
It was great seeing you today!  For your blood pressure: Stop by the pharmacy to pick up your Hydrochlorothiazide. Do not take amlodipine.   For your pain: Stop by the pharmacy to pick up your prescriptions. Stop taking the muscle relaxers from [place] while you are taking Baclofen (muscle relaxer).  Be sure to keep your scheduled MRI of your lower back. Continue working on weight loss and increasing exercise as tolerated. Let your body be your guide.   Your cough may be related to acid reflux. Be sure to take Pepcid twice daily.   Watch for worsening symptoms such as an increasing weakness or loss of sensation legs, increasing pain and the loss of bladder or bowel function. Should any of these occur, go to the emergency department immediately.   Be sure to return the cardiology phone call to schedule your stress test.    If you have questions or concerns please do not hesitate to call at [phone].  Dr. [facility]

## 2020-10-11 NOTE — Progress Notes (Unsigned)
   SUBJECTIVE:   CHIEF COMPLAINT / HPI:   Chief Complaint  Patient presents with  . Hypertension    Follow up     Jim Hunter is a 55 y.o. male here for follow up for LE pain and HTN follow up.    HTN He is not taking his amlodipine and HCTZ because he started they caused a cough. Denies chest pain, palpitations, lower extremity edema, lightheadedness, and vision changes. Having headaches.  Home BP Monitoring: No  Exercises: no Low salt diet: yes   Leg and buttock pain Pt reports bilateral leg, hip and buttock pain. Ran out Baclofen. Taking a muscle relaxer from [place]. Used Voltaren gel once and stated it didn't help so he stopped. Lidocaine patches broke him out. Took Gabapentin but ran out. Back pain has improved but is experiences more leg pain now. Denies perianal numbness, cancer, unexplained weight loss, immunosuppression, prolonged use of steroids, history of IV drug use, urinary tract infection, pain that is increased or unrelieved by rest, fever, bladder or bowel incontinence, significant trauma related to age.   PERTINENT  PMH / PSH: reviewed and updated as appropriate   OBJECTIVE:   BP (!) 142/118   Pulse 88   Ht 5\' 9"  (1.753 m)   Wt 260 lb 12.8 oz (118.3 kg)   SpO2 99%   BMI 38.51 kg/m    GEN:  uncomfortable appearing male, in no acute distress  CV: regular rate and rhythm, no murmurs appreciated  RESP: no increased work of breathing, clear to ascultation bilaterally MSK:  Leaning forward and rotated to the right, No L spine midline or SI joint tenderness, mild paraspinal tenderness, bilateral  SKIN: warm, dry NEURO: strength 5/5, sensations grossly intact (decreased at baseline at the right knee s/p surgery),moves all extremities appropriately, antalgic gait,      ASSESSMENT/PLAN:   Hypertension BP uncontrolled. Pt is not taking medications as he believes they are causing his cough. Suspect uncontrolled GERD. Discussed restarting HCTZ. Holding  amlodipine. Restart when able.   GERD (gastroesophageal reflux disease) Uncontrolled. Pt not taking Pepcid or Omeprazole. Restart PPI. Nexium 40 mg daily. Pepcid BID PRN. Pt likely has cough due to uncontrolled GERD. Question if he has an ulcer, he previously had evidence of anemia with UTD colonoscopy. Delaying workup until patient sees cardiology for DOE anginal equivalent. Will need to get control GERD sx to increase compliance with anti-hypertensive medications.    DDD (degenerative disc disease), lumbar Continue multimodal approach for pain. Acute exacerbations complicated by pt not having refills/asking for refills of his medication. Pt to stop taking [place] muscle relaxer while taking Baclofen. Pt manipulating seated and standing positions for comfort. Leans forward on shopping cart.  Pain is radicular in nature. Back exam today is improved from previous. History concerning for spinal stenosis. Reviewed MR Lumbar from 2006. There may be progression of spinal disease. Obtain MR Lumbar spine. No red flag sx. ED precautions given. Pt agrees with plan.      [name], DO PGY-2, [facility] 10/11/2020

## 2020-10-13 NOTE — Assessment & Plan Note (Addendum)
BP uncontrolled. Pt is not taking medications as he believes they are causing his cough. Suspect uncontrolled GERD. Discussed restarting HCTZ. Holding amlodipine. Restart when able.

## 2020-10-13 NOTE — Assessment & Plan Note (Addendum)
Continue multimodal approach for pain. Acute exacerbations complicated by pt not having refills/asking for refills of his medication. Pt to stop taking [place] muscle relaxer while taking Baclofen. Pt manipulating seated and standing positions for comfort. Leans forward on shopping cart.  Pain is radicular in nature. Back exam today is improved from previous. History concerning for spinal stenosis. Reviewed MR Lumbar from 2006. There may be progression of spinal disease. Obtain MR Lumbar spine. No red flag sx. ED precautions given. Pt agrees with plan.

## 2020-10-13 NOTE — Assessment & Plan Note (Addendum)
Uncontrolled. Pt not taking Pepcid or Omeprazole. Restart PPI. Nexium 40 mg daily. Pepcid BID PRN. Pt likely has cough due to uncontrolled GERD. Question if he has an ulcer, he previously had evidence of anemia with UTD colonoscopy. Delaying workup until patient sees cardiology for DOE anginal equivalent. Will need to get control GERD sx to increase compliance with anti-hypertensive medications.

## 2020-10-20 ENCOUNTER — Ambulatory Visit (HOSPITAL_COMMUNITY)
Admission: RE | Admit: 2020-10-20 | Discharge: 2020-10-20 | Disposition: A | Discharge status: Home / Self Care | Payer: No Typology Code available for payment source | Source: Ambulatory Visit | Attending: Family Medicine | Admitting: Family Medicine

## 2020-10-20 ENCOUNTER — Other Ambulatory Visit: Payer: Self-pay

## 2020-10-20 DIAGNOSIS — M5441 Lumbago with sciatica, right side: Secondary | ICD-10-CM | POA: Diagnosis present

## 2020-10-20 DIAGNOSIS — M5442 Lumbago with sciatica, left side: Secondary | ICD-10-CM | POA: Diagnosis not present

## 2020-10-20 DIAGNOSIS — G8929 Other chronic pain: Secondary | ICD-10-CM | POA: Diagnosis present

## 2020-10-25 ENCOUNTER — Other Ambulatory Visit: Payer: Self-pay | Admitting: Family Medicine

## 2020-10-25 ENCOUNTER — Encounter: Payer: Self-pay | Admitting: *Deleted

## 2020-10-25 DIAGNOSIS — M5126 Other intervertebral disc displacement, lumbar region: Secondary | ICD-10-CM

## 2020-10-25 DIAGNOSIS — M48061 Spinal stenosis, lumbar region without neurogenic claudication: Secondary | ICD-10-CM

## 2020-10-25 NOTE — Progress Notes (Signed)
Spoke with patient about MRI Results. Progression of spinal disease and spinal stenosis. Requested to follow up with Dr.Pool, Neurosurgery. Referral placed.   [name], DO PGY-2, [facility] 10/25/2020

## 2020-11-17 ENCOUNTER — Encounter: Payer: Self-pay | Admitting: Family Medicine

## 2020-11-17 ENCOUNTER — Other Ambulatory Visit (HOSPITAL_COMMUNITY): Payer: Self-pay

## 2020-11-17 MED ORDER — HYDROCODONE-ACETAMINOPHEN 5-325 MG PO TABS
1.0000 | ORAL_TABLET | Freq: Four times a day (QID) | ORAL | 0 refills | Status: DC | PRN
  Filled 2020-11-17: qty 40, 10d supply, fill #0

## 2020-11-17 MED ORDER — CYCLOBENZAPRINE HCL 10 MG PO TABS
10.0000 mg | ORAL_TABLET | Freq: Three times a day (TID) | ORAL | 1 refills | Status: DC
  Filled 2020-11-17: qty 30, 10d supply, fill #0

## 2021-01-13 ENCOUNTER — Other Ambulatory Visit (HOSPITAL_COMMUNITY): Payer: Self-pay

## 2021-01-13 ENCOUNTER — Other Ambulatory Visit: Payer: Self-pay | Admitting: Family Medicine

## 2021-01-14 ENCOUNTER — Other Ambulatory Visit (HOSPITAL_COMMUNITY): Payer: Self-pay

## 2021-01-14 MED ORDER — SILDENAFIL CITRATE 100 MG PO TABS
100.0000 mg | ORAL_TABLET | Freq: Every day | ORAL | 0 refills | Status: DC | PRN
  Filled 2021-01-14: qty 10, 50d supply, fill #0

## 2021-01-19 ENCOUNTER — Other Ambulatory Visit (HOSPITAL_COMMUNITY): Payer: Self-pay

## 2021-03-15 ENCOUNTER — Other Ambulatory Visit: Payer: Self-pay

## 2021-03-15 ENCOUNTER — Ambulatory Visit (INDEPENDENT_AMBULATORY_CARE_PROVIDER_SITE_OTHER): Payer: No Typology Code available for payment source | Admitting: Family Medicine

## 2021-03-15 DIAGNOSIS — R059 Cough, unspecified: Secondary | ICD-10-CM | POA: Diagnosis not present

## 2021-03-15 DIAGNOSIS — K219 Gastro-esophageal reflux disease without esophagitis: Secondary | ICD-10-CM

## 2021-03-15 NOTE — Assessment & Plan Note (Addendum)
-  cough possibly multifactorial, sinusitis may also be contributing, low concern for viral illness or bacterial process such as pneumonia given non-focal findings. Could also be due to lingering symptoms of previous COVID infection although no positive test.  -encouraged zytrec or claritin use -honey and tea

## 2021-03-15 NOTE — Assessment & Plan Note (Addendum)
-  likely contributing to cough  -encouraged daily use of nexium  -maintaining food diary -reviewed GERD precautions

## 2021-03-15 NOTE — Patient Instructions (Signed)
It was great seeing you today!  I think your cough may due to multiple things. For the reflux, please take nexium daily and maintain a food diary. Make sure to keep at least a 3 hour gap in between your last meal and bedtime.   For allergies, please try zyrtec or claritin to ease these symptoms. I do not think you have an active infection that is causing your cough, your exam was completely normal.  Please follow up at your next scheduled appointment, if anything arises between now and then, please don't hesitate to contact our office.   Thank you for allowing [place] to be a part of your medical care!  Thank you, Dr. Larae Grooms

## 2021-03-15 NOTE — Progress Notes (Signed)
    SUBJECTIVE:   CHIEF COMPLAINT / HPI:   Patient presents endorsing productive cough with yellow sputum that started about 6 months ago. Only other symptoms was sneezing initially but not currently. Denies any other associated symptoms including fever, chills, myalgias, nausea and vomiting. States that since it started it has improved to the point where it is no longer multiple times a day but now once a week. Gets worse when he is outside but does not have a history of seasonal allergies. Otherwise, he has not noticed any patterns or triggers of his cough. Denies recent viral infection or positive COVID test. Has a history of reflux, endorses globus sensation along with occasional epigastric pain. Takes nexium as needed but not daily. Tries to avoid triggers. States that he has been eating well and denies abdominal pain. Former smoker that quit at 55 years old after smoking for 10 years.   OBJECTIVE:   BP (!) 132/94   Pulse 84   Ht '5\' 10"'$  (1.778 m)   Wt 265 lb 3.2 oz (120.3 kg)   SpO2 98%   BMI 38.05 kg/m   General: Patient well-appearing, in no acute distress. HEENT: no oral or buccal erythema or ulcers, normal tongue CV: RRR, no murmurs or gallops auscultated Resp: CTAB, no wheezing, rales or rhonchi, good air movement throughout all lung fields, breathing comfortably on room air, non-focal findings noted Abdomen: soft, nontender, presence of bowel sounds Ext: radial pulses strong and equal bilaterally Neuro: normal gait  ASSESSMENT/PLAN:   GERD (gastroesophageal reflux disease) -likely contributing to cough  -encouraged daily use of nexium  -maintaining food diary -reviewed GERD precautions  Cough -cough possibly multifactorial, sinusitis may also be contributing, low concern for viral illness or bacterial process such as pneumonia given non-focal findings. Could also be due to lingering symptoms of previous COVID infection although no positive test.  -encouraged zytrec or  claritin use -honey and tea      Jalecia Leon Larae Grooms, [place]

## 2021-03-30 ENCOUNTER — Other Ambulatory Visit: Payer: Self-pay | Admitting: Family Medicine

## 2021-03-30 ENCOUNTER — Other Ambulatory Visit (HOSPITAL_COMMUNITY): Payer: Self-pay

## 2021-03-30 MED ORDER — SILDENAFIL CITRATE 100 MG PO TABS
100.0000 mg | ORAL_TABLET | Freq: Every day | ORAL | 0 refills | Status: DC | PRN
  Filled 2021-03-30: qty 10, 50d supply, fill #0

## 2021-03-30 MED FILL — Esomeprazole Magnesium Cap Delayed Release 40 MG (Base Eq): ORAL | 30 days supply | Qty: 30 | Fill #0 | Status: AC

## 2021-05-26 ENCOUNTER — Telehealth: Payer: Self-pay | Admitting: Family Medicine

## 2021-05-26 ENCOUNTER — Other Ambulatory Visit (HOSPITAL_COMMUNITY): Payer: Self-pay

## 2021-05-26 ENCOUNTER — Other Ambulatory Visit: Payer: Self-pay | Admitting: Family Medicine

## 2021-05-26 MED FILL — Esomeprazole Magnesium Cap Delayed Release 40 MG (Base Eq): ORAL | 30 days supply | Qty: 30 | Fill #1 | Status: AC

## 2021-05-26 NOTE — Telephone Encounter (Signed)
Requested medication (s) are due for refill today: yes  Requested medication (s) are on the active medication list: yes  Last refill:  11/07/2017 #16g/6RF  Future visit scheduled: no  Notes to clinic:  Unable to refill per protocol, medication not assigned to the refill protocol.      Requested Prescriptions  Pending Prescriptions Disp Refills   fluticasone (FLONASE) 50 MCG/ACT nasal spray 16 g 6    Sig: PLACE 2 SPRAYS INTO BOTH NOSTRILS DAILY.     There is no refill protocol information for this order

## 2021-05-26 NOTE — Telephone Encounter (Signed)
Requested medication (s) are due for refill today: yes  Requested medication (s) are on the active medication list: yes  Last refill:  11/07/2017 #16g/6RF  Future visit scheduled: no  Notes to clinic:  Unable to refill per protocol, medication not assigned to the refill protocol. Pt has different PCP listed as well.     Requested Prescriptions  Pending Prescriptions Disp Refills   fluticasone (FLONASE) 50 MCG/ACT nasal spray 16 g 6    Sig: PLACE 2 SPRAYS INTO BOTH NOSTRILS DAILY.     There is no refill protocol information for this order

## 2021-05-28 MED ORDER — SILDENAFIL CITRATE 100 MG PO TABS
100.0000 mg | ORAL_TABLET | Freq: Every day | ORAL | 0 refills | Status: DC | PRN
  Filled 2021-05-28: qty 10, 50d supply, fill #0

## 2021-05-28 MED ORDER — FLUTICASONE PROPIONATE 50 MCG/ACT NA SUSP
2.0000 | Freq: Every day | NASAL | 6 refills | Status: DC
  Filled 2021-05-28: qty 16, 30d supply, fill #0

## 2021-05-30 ENCOUNTER — Other Ambulatory Visit: Payer: Self-pay | Admitting: Family Medicine

## 2021-05-30 ENCOUNTER — Other Ambulatory Visit (HOSPITAL_COMMUNITY): Payer: Self-pay

## 2021-05-30 MED ORDER — FLUTICASONE PROPIONATE 50 MCG/ACT NA SUSP
2.0000 | Freq: Every day | NASAL | 6 refills | Status: AC
  Filled 2021-06-02: qty 16, 30d supply, fill #0

## 2021-05-30 NOTE — Telephone Encounter (Signed)
Flonase Rx sent to pharmacy   [name], DO

## 2021-05-31 ENCOUNTER — Other Ambulatory Visit (HOSPITAL_COMMUNITY): Payer: Self-pay

## 2021-06-02 ENCOUNTER — Other Ambulatory Visit (HOSPITAL_COMMUNITY): Payer: Self-pay

## 2021-06-30 ENCOUNTER — Other Ambulatory Visit (HOSPITAL_COMMUNITY): Payer: Self-pay

## 2021-06-30 ENCOUNTER — Other Ambulatory Visit: Payer: Self-pay | Admitting: Family Medicine

## 2021-06-30 MED ORDER — SILDENAFIL CITRATE 100 MG PO TABS
100.0000 mg | ORAL_TABLET | Freq: Every day | ORAL | 0 refills | Status: DC | PRN
  Filled 2021-06-30: qty 10, 10d supply, fill #0
  Filled 2021-09-28: qty 6, 30d supply, fill #0
  Filled 2022-03-24: qty 4, 30d supply, fill #1

## 2021-06-30 MED FILL — Esomeprazole Magnesium Cap Delayed Release 40 MG (Base Eq): ORAL | 30 days supply | Qty: 30 | Fill #2 | Status: AC

## 2021-07-01 ENCOUNTER — Other Ambulatory Visit (HOSPITAL_COMMUNITY): Payer: Self-pay

## 2021-07-06 ENCOUNTER — Ambulatory Visit (INDEPENDENT_AMBULATORY_CARE_PROVIDER_SITE_OTHER): Payer: No Typology Code available for payment source | Admitting: Family Medicine

## 2021-07-06 ENCOUNTER — Ambulatory Visit (INDEPENDENT_AMBULATORY_CARE_PROVIDER_SITE_OTHER): Payer: No Typology Code available for payment source

## 2021-07-06 ENCOUNTER — Other Ambulatory Visit: Payer: Self-pay

## 2021-07-06 ENCOUNTER — Other Ambulatory Visit (HOSPITAL_COMMUNITY): Payer: Self-pay

## 2021-07-06 ENCOUNTER — Encounter: Payer: Self-pay | Admitting: Family Medicine

## 2021-07-06 VITALS — BP 158/124 | HR 93 | Ht 70.0 in | Wt 279.0 lb

## 2021-07-06 DIAGNOSIS — F1090 Alcohol use, unspecified, uncomplicated: Secondary | ICD-10-CM

## 2021-07-06 DIAGNOSIS — M5441 Lumbago with sciatica, right side: Secondary | ICD-10-CM

## 2021-07-06 DIAGNOSIS — I10 Essential (primary) hypertension: Secondary | ICD-10-CM | POA: Diagnosis not present

## 2021-07-06 DIAGNOSIS — E785 Hyperlipidemia, unspecified: Secondary | ICD-10-CM

## 2021-07-06 DIAGNOSIS — Z23 Encounter for immunization: Secondary | ICD-10-CM

## 2021-07-06 DIAGNOSIS — R7303 Prediabetes: Secondary | ICD-10-CM | POA: Insufficient documentation

## 2021-07-06 DIAGNOSIS — R0609 Other forms of dyspnea: Secondary | ICD-10-CM | POA: Diagnosis not present

## 2021-07-06 DIAGNOSIS — M5442 Lumbago with sciatica, left side: Secondary | ICD-10-CM

## 2021-07-06 DIAGNOSIS — K219 Gastro-esophageal reflux disease without esophagitis: Secondary | ICD-10-CM

## 2021-07-06 DIAGNOSIS — Z789 Other specified health status: Secondary | ICD-10-CM

## 2021-07-06 DIAGNOSIS — M51369 Other intervertebral disc degeneration, lumbar region without mention of lumbar back pain or lower extremity pain: Secondary | ICD-10-CM

## 2021-07-06 DIAGNOSIS — D509 Iron deficiency anemia, unspecified: Secondary | ICD-10-CM

## 2021-07-06 DIAGNOSIS — G4733 Obstructive sleep apnea (adult) (pediatric): Secondary | ICD-10-CM

## 2021-07-06 DIAGNOSIS — M5136 Other intervertebral disc degeneration, lumbar region: Secondary | ICD-10-CM

## 2021-07-06 DIAGNOSIS — G8929 Other chronic pain: Secondary | ICD-10-CM

## 2021-07-06 DIAGNOSIS — G4730 Sleep apnea, unspecified: Secondary | ICD-10-CM

## 2021-07-06 LAB — POCT GLYCOSYLATED HEMOGLOBIN (HGB A1C): Hemoglobin A1C: 6.3 % — AB (ref 4.0–5.6)

## 2021-07-06 MED ORDER — BACLOFEN 20 MG PO TABS
20.0000 mg | ORAL_TABLET | Freq: Three times a day (TID) | ORAL | 0 refills | Status: DC
  Filled 2021-07-06: qty 30, 10d supply, fill #0

## 2021-07-06 MED ORDER — HYDROCHLOROTHIAZIDE 12.5 MG PO CAPS
12.5000 mg | ORAL_CAPSULE | Freq: Every day | ORAL | 1 refills | Status: DC
  Filled 2021-07-06: qty 30, 30d supply, fill #0
  Filled 2021-08-02: qty 30, 30d supply, fill #1

## 2021-07-06 MED ORDER — ESOMEPRAZOLE MAGNESIUM 40 MG PO CPDR
40.0000 mg | DELAYED_RELEASE_CAPSULE | Freq: Every day | ORAL | 2 refills | Status: DC
  Filled 2021-07-06 – 2021-09-28 (×2): qty 90, 90d supply, fill #0
  Filled 2022-03-24: qty 90, 90d supply, fill #1

## 2021-07-06 MED ORDER — AMLODIPINE BESYLATE 10 MG PO TABS
10.0000 mg | ORAL_TABLET | Freq: Every day | ORAL | 0 refills | Status: DC
Start: 2021-07-06 — End: 2021-10-24
  Filled 2021-07-06: qty 90, 90d supply, fill #0

## 2021-07-06 NOTE — Patient Instructions (Signed)
It was great seeing you today!  Please check-out at the front desk before leaving the clinic. I'd like to see you back in 3 months but if you need to be seen earlier than that for any new issues we're happy to fit you in, just give [place] a call!  Visit Remembers: - Stop by the pharmacy to pick up your prescriptions  - Continue to work on your healthy eating habits and incorporating exercise into your daily life.  - Your goal is to have an BP < 120/80 - Medicine Changes: restart you blood pressure medications (amlodipine and hydrochlorothiazide)  - A referral to cardiology was placed be call us in 2 weeks if you have not been contacted about an appointment.  Regarding lab work today:  Due to recent changes in healthcare laws, you may see the results of your imaging and laboratory studies on MyChart before your provider has had a chance to review them.  I understand that in some cases there may be results that are confusing or concerning to you. Not all laboratory results come back in the same time frame and you may be waiting for multiple results in order to interpret others.  Please give [place] 72 hours in order for your provider to thoroughly review all the results before contacting the office for clarification of your results. If everything is normal, you will get a letter in the mail or a message in My Chart. Please give [place] a call if you do not hear from [place] after 2 weeks.  Please bring all of your medications with you to each visit.    Feel free to call with any questions or concerns at any time, at [phone].   Take care,  Dr. Rushie [facility]

## 2021-07-06 NOTE — Assessment & Plan Note (Signed)
Repeat CBC today. Suspect GI ulcer given GERD. Recent colonoscopy was normal.

## 2021-07-06 NOTE — Assessment & Plan Note (Signed)
a1c 6.3 from 5.7. . Encouraged continued diet rich in vegetables and complex carbs.  Heart healthy carb modified diet. Counseled on need to continue exercising.

## 2021-07-06 NOTE — Assessment & Plan Note (Signed)
Encouraged to limit alcohol use. Drinks 2-3 beers a few times a week.  Notes he decreased his intake.

## 2021-07-06 NOTE — Assessment & Plan Note (Addendum)
Possibly anginal equivalent despite body mass index is 40.03 kg/m.  ECHO was unremarkable Dec 2021. Repeat TSH today. Referral to cardiology for stress test.

## 2021-07-06 NOTE — Assessment & Plan Note (Signed)
Reports statin intolerance and Zetia.  Repeat lipid panel today. Referral to cardiology to consider PSK-9.  Last LDL 135

## 2021-07-06 NOTE — Assessment & Plan Note (Signed)
Stable. Refilled baclofen.

## 2021-07-06 NOTE — Assessment & Plan Note (Addendum)
Stable. Cough resolved.  Refilled esomeprazole

## 2021-07-06 NOTE — Assessment & Plan Note (Addendum)
Previously had a sleep study 2017 but has no CPAP.   - CPAP titration study ordered

## 2021-07-06 NOTE — Assessment & Plan Note (Signed)
BP not at goal.  Start amlodipine 10 mg and HCTZ 12.5 mg.   - BMP today

## 2021-07-06 NOTE — Progress Notes (Signed)
° °  SUBJECTIVE:   CHIEF COMPLAINT / HPI:    Jim Hunter is a 55 y.o. male here for follow up for his chronic health conditions.    Chronic Back Pain  Feels a lot better these days. He occasionally has to take a muscle relaxer for muscle spasms.   DOE Continues to have dyspnea. He is unsure why he has so much weight gain. Has noticed he has some swelling in his leg. He never followed up with the cardiologist.  He doesn't lay flat in the bed as he has difficultly breathing. Sleeps in a recliner until his family tells him to go to bed.   HTN  Taking nothing for now. Cough resolved. Would like to go back on his medications.   Sleep Apnea  He snores at night and at times his wife says he stops breathing at night. He got his sleep study but never got a CPAP. He would like to try a CPAP as he is increasing tired.     PERTINENT  PMH / PSH: reviewed and updated as appropriate   OBJECTIVE:   BP (!) 158/124    Pulse 93    Ht 5\' 10"  (1.778 m)    Wt 279 lb (126.6 kg)    SpO2 99%    BMI 40.03 kg/m    GEN: pleasant well appearing male, in no acute distress  CV: regular rate and rhythm, no murmurs appreciated  RESP: no increased work of breathing, clear to ascultation bilaterally MSK: 1+ LE edema SKIN: warm, dry  ASSESSMENT/PLAN:   Hypertension BP not at goal.  Start amlodipine 10 mg and HCTZ 12.5 mg.   - BMP today   OSA (obstructive sleep apnea) Previously had a sleep study but no CPAP.   - CPAP titration study ordered   GERD (gastroesophageal reflux disease) Stable. Cough resolved.  Refilled esomeprazole   DDD (degenerative disc disease), lumbar Stable. Refilled baclofen.   Hyperlipidemia Reports statin intolerance and Zetia.  Repeat lipid panel today. Referral to cardiology to consider PSK-9.  Last LDL 135   Alcohol use Encouraged to limit alcohol use. Drinks 2-3 beers a few times a week.  Notes he decreased his intake.   DOE (dyspnea on exertion) Possibly anginal  equivalent despite body mass index is 40.03 kg/m.  ECHO was unremarkable Dec 2021. Repeat TSH today. Referral to cardiology for stress test.    Microcytic anemia Repeat CBC today. Suspect GI ulcer given GERD. Recent colonoscopy was normal.   Prediabetes a1c 6.3 from 5.7. . Encouraged continued diet rich in vegetables and complex carbs.  Heart healthy carb modified diet. Counseled on need to continue exercising.       COVID booster given today.  Influenza given.    [name], DO PGY-3, [facility] 07/06/2021

## 2021-07-07 LAB — BASIC METABOLIC PANEL
BUN/Creatinine Ratio: 17 (ref 9–20)
BUN: 17 mg/dL (ref 6–24)
CO2: 20 mmol/L (ref 20–29)
Calcium: 9 mg/dL (ref 8.7–10.2)
Chloride: 103 mmol/L (ref 96–106)
Creatinine, Ser: 1 mg/dL (ref 0.76–1.27)
Glucose: 95 mg/dL (ref 70–99)
Potassium: 3.9 mmol/L (ref 3.5–5.2)
Sodium: 143 mmol/L (ref 134–144)
eGFR: 88 mL/min/{1.73_m2} (ref >59)

## 2021-07-07 LAB — CBC
Hematocrit: 39.6 % (ref 37.5–51.0)
Hemoglobin: 12.8 g/dL — ABNORMAL LOW (ref 13.0–17.7)
MCH: 27 pg (ref 26.6–33.0)
MCHC: 32.3 g/dL (ref 31.5–35.7)
MCV: 84 fL (ref 79–97)
Platelets: 277 10*3/uL (ref 150–450)
RBC: 4.74 x10E6/uL (ref 4.14–5.80)
RDW: 15.6 % — ABNORMAL HIGH (ref 11.6–15.4)
WBC: 6.4 10*3/uL (ref 3.4–10.8)

## 2021-07-07 LAB — LIPID PANEL
Chol/HDL Ratio: 8.4 ratio — ABNORMAL HIGH (ref 0.0–5.0)
Cholesterol, Total: 243 mg/dL — ABNORMAL HIGH (ref 100–199)
HDL: 29 mg/dL — ABNORMAL LOW (ref >39)
LDL Chol Calc (NIH): 121 mg/dL — ABNORMAL HIGH (ref 0–99)
Triglycerides: 520 mg/dL — ABNORMAL HIGH (ref 0–149)
VLDL Cholesterol Cal: 93 mg/dL — ABNORMAL HIGH (ref 5–40)

## 2021-07-07 LAB — TSH+FREE T4
Free T4: 0.93 ng/dL (ref 0.82–1.77)
TSH: 2.74 u[IU]/mL (ref 0.450–4.500)

## 2021-07-13 ENCOUNTER — Other Ambulatory Visit (HOSPITAL_COMMUNITY): Payer: Self-pay

## 2021-08-02 ENCOUNTER — Other Ambulatory Visit (HOSPITAL_COMMUNITY): Payer: Self-pay

## 2021-08-05 NOTE — Progress Notes (Signed)
Cardiology Office Note:    Date:  08/08/2021   ID:  Jim Hunter, DOB [birth year], MRN [id number]  PCP:  [name], [facility] Cardiologist:  Lenna Sciara, MD Referring MD: Jim Resides, MD   Chief Complaint/Reason for Referral: Dyspnea on exertion  ASSESSMENT:    Dyspnea, unspecified type  Hyperlipidemia, unspecified hyperlipidemia type  BMI 40.0-44.9, adult ([place])  Primary hypertension    PLAN:    In order of problems listed above:  1.  The patient has multiple reasons to be dyspneic including his body habitus, untreated sleep apnea, and a history of smoking (though only 10 pack years).  His most recent echocardiogram demonstrates normal diastolic function from last year.  He has no valvular disease.  I believe his dyspnea is likely due to his 40 pound weight gain.  We will obtain a coronary CTA to evaluate further as this may represent an anginal equivalent.  Follow-up in 6 months or earlier if needed.  2.  Will refer to pharmacy for lipid management.  He is intolerant of Crestor and Lipitor.  3.  We will refer to pharmacy for consideration of medical therapy for morbid obesity.  4.  His diastolic blood pressure is elevated today.  He did not take his amlodipine this morning yet.              Dispo:  No follow-ups on file.     Medication Adjustments/Labs and Tests Ordered: Current medicines are reviewed at length with the patient today.  Concerns regarding medicines are outlined above.   Tests Ordered: No orders of the defined types were placed in this encounter.   Medication Changes: No orders of the defined types were placed in this encounter.   History of Present Illness:    The patient is a 56 y.o. male with the indicated medical history here for recommendations regarding dyspnea on exertion.  The patient was seen by his primary care provider last month complaining of dyspnea.  Laboratories were unremarkable aside from  very elevated cholesterol and triglycerides with an LDL of 121.  He does have a history of sleep apnea but never started CPAP.  His dyspnea was thought to be due to a body mass index of 48 kg/m consistent with morbid obesity.  However a cardiology consult was pursued to consider further evaluation.  Of note the patient did have an echocardiogram a few years ago which showed normal LV function with mild left ventricular hypertrophy.  He recently changed his working shift.  He was working second shift here at [employer] is now working third shift.  Since that change he has gained 40 pounds.  He has noticed increasing shortness of breath when going up stairs with this.  He denies any chest pain, presyncope, syncope, palpitations, paroxysmal nocturnal dyspnea, or orthopnea.  He does notice mild edema in his legs but he has been on his feet for a long time.  He is required no emergency room visits or hospitalizations.   Previous Medical History: Past Medical History:  Diagnosis Date   Arthritis    GERD (gastroesophageal reflux disease)    Hx of adenomatous polyp of colon 02/03/2019   Hyperlipidemia    Hypertension    PPD screening test last 9 years ago first started as child   treated as child for positive ppd test   Sleep apnea 3 years ago   no borderline no cpap needed per sleep studty done     Current Medications:  No outpatient medications have been marked as taking for the 08/08/21 encounter (Office Visit) with Early Osmond, MD.     Allergies:    Crestor [rosuvastatin], Lipitor [atorvastatin calcium], Lisinopril, and Losartan   Social History:   Social History   Tobacco Use   Smoking status: Former    Packs/day: 1.00    Years: 10.00    Pack years: 10.00    Types: Cigarettes   Smokeless tobacco: Never  Vaping Use   Vaping Use: Never used  Substance Use Topics   Alcohol use: Yes    Comment: Ocasional    Drug use: No     Family Hx: Family History  Problem Relation Age of  Onset   Hypertension Mother    Hypertension Brother    Hypertension Maternal Grandmother    Hypertension Sister    Colon cancer Neg Hx    Colon polyps Neg Hx    Esophageal cancer Neg Hx    Rectal cancer Neg Hx    Stomach cancer Neg Hx    Diabetes Neg Hx    Kidney disease Neg Hx    Liver disease Neg Hx    Prostate cancer Neg Hx      Review of Systems:   Please see the history of present illness.    All other systems reviewed and are negative.     EKGs/Labs/Other Test Reviewed:    EKG:  EKG today: Sinus tachycardia; prior EKG: From 2016 demonstrates sinus rhythm  Prior CV studies: Echocardiogram 2021 1. Left ventricular ejection fraction, by estimation, is 55 to 60%. The  left ventricle has normal function. The left ventricle has no regional  wall motion abnormalities. There is mild left ventricular hypertrophy.  Left ventricular diastolic parameters  were normal.   2. Right ventricular systolic function is normal. The right ventricular  size is normal.   3. The mitral valve is normal in structure. Trivial mitral valve  regurgitation. No evidence of mitral stenosis.   4. The aortic valve is tricuspid. Aortic valve regurgitation is not  visualized. Mild aortic valve sclerosis is present, with no evidence of  aortic valve stenosis.   5. Aortic dilatation noted. There is mild dilatation of the aortic root,  measuring 38 mm.   6. The inferior vena cava is normal in size with greater than 50%  respiratory variability, suggesting right atrial pressure of 3 mmHg.   Imaging studies that I have independently reviewed today: Echocardiogram  Recent Labs: 07/06/2021: BUN 17; Creatinine, Ser 1.00; Hemoglobin 12.8; Platelets 277; Potassium 3.9; Sodium 143; TSH 2.740   Recent Lipid Panel Lab Results  Component Value Date/Time   CHOL 243 (H) 07/06/2021 09:12 AM   TRIG 520 (H) 07/06/2021 09:12 AM   HDL 29 (L) 07/06/2021 09:12 AM   LDLCALC 121 (H) 07/06/2021 09:12 AM    Risk  Assessment/Calculations:          Physical Exam:    VS:  Ht 5\' 9"  (1.753 m)    Wt 268 lb 3.2 oz (121.7 kg)    BMI 39.61 kg/m    Wt Readings from Last 3 Encounters:  08/08/21 268 lb 3.2 oz (121.7 kg)  07/06/21 279 lb (126.6 kg)  03/15/21 265 lb 3.2 oz (120.3 kg)    GENERAL:  No apparent distress, AOx3 HEENT:  No carotid bruits, +2 carotid impulses, no scleral icterus CAR: RRR no murmurs, gallops, rubs, or thrills RES:  Clear to auscultation bilaterally ABD:  Soft, nontender, nondistended, positive bowel sounds x  4 VASC:  +2 radial pulses, +2 carotid pulses, palpable pedal pulses NEURO:  CN 2-12 grossly intact; motor and sensory grossly intact PSYCH:  No active depression or anxiety EXT:  No edema, ecchymosis, or cyanosis  Signed, Early Osmond, MD  08/08/2021 8:05 AM    [facility] [address], [place], [place]  [address] Phone: [phone]; Fax: [phone]   Note:  This document was prepared using Dragon voice recognition software and may include unintentional dictation errors.

## 2021-08-08 ENCOUNTER — Other Ambulatory Visit: Payer: Self-pay

## 2021-08-08 ENCOUNTER — Encounter: Payer: Self-pay | Admitting: Internal Medicine

## 2021-08-08 ENCOUNTER — Ambulatory Visit (INDEPENDENT_AMBULATORY_CARE_PROVIDER_SITE_OTHER): Payer: No Typology Code available for payment source | Admitting: Internal Medicine

## 2021-08-08 ENCOUNTER — Other Ambulatory Visit (HOSPITAL_COMMUNITY): Payer: Self-pay

## 2021-08-08 VITALS — BP 130/100 | HR 106 | Ht 69.0 in | Wt 268.2 lb

## 2021-08-08 DIAGNOSIS — E785 Hyperlipidemia, unspecified: Secondary | ICD-10-CM

## 2021-08-08 DIAGNOSIS — Z6841 Body Mass Index (BMI) 40.0 and over, adult: Secondary | ICD-10-CM

## 2021-08-08 DIAGNOSIS — I5189 Other ill-defined heart diseases: Secondary | ICD-10-CM

## 2021-08-08 DIAGNOSIS — R0600 Dyspnea, unspecified: Secondary | ICD-10-CM

## 2021-08-08 DIAGNOSIS — I10 Essential (primary) hypertension: Secondary | ICD-10-CM | POA: Diagnosis not present

## 2021-08-08 LAB — BASIC METABOLIC PANEL
BUN/Creatinine Ratio: 16 (ref 9–20)
BUN: 17 mg/dL (ref 6–24)
CO2: 21 mmol/L (ref 20–29)
Calcium: 9.5 mg/dL (ref 8.7–10.2)
Chloride: 101 mmol/L (ref 96–106)
Creatinine, Ser: 1.04 mg/dL (ref 0.76–1.27)
Glucose: 176 mg/dL — ABNORMAL HIGH (ref 70–99)
Potassium: 3.8 mmol/L (ref 3.5–5.2)
Sodium: 136 mmol/L (ref 134–144)
eGFR: 84 mL/min/{1.73_m2} (ref >59)

## 2021-08-08 MED ORDER — METOPROLOL TARTRATE 100 MG PO TABS
100.0000 mg | ORAL_TABLET | Freq: Once | ORAL | 0 refills | Status: DC
  Filled 2021-08-08 – 2021-08-19 (×2): qty 1, 1d supply, fill #0

## 2021-08-08 NOTE — Addendum Note (Signed)
Addended by: Devra Dopp E on: 08/08/2021 10:28 AM   Modules accepted: Orders

## 2021-08-08 NOTE — Patient Instructions (Addendum)
Medication Instructions:  NO CHANGES  *If you need a refill on your cardiac medications before your next appointment, please call your pharmacy*   Lab Work: BMET  If you have labs (blood work) drawn today and your tests are completely normal, you will receive your results only by: [place] (if you have MyChart) OR A paper copy in the mail If you have any lab test that is abnormal or we need to change your treatment, we will call you to review the results.   Testing/Procedures:   Your cardiac CT will be scheduled at one of the below locations:   [facility] [address] [place], [place] [address] [phone]  If scheduled at [facility], please arrive at the [facility] main entrance (entrance A) of [facility] 30 minutes prior to test start time. You can use the FREE valet parking offered at the main entrance (encouraged to control the heart rate for the test) Proceed to the [facility] Radiology Department (first floor) to check-in and test prep.  Please follow these instructions carefully (unless otherwise directed):  Hold all erectile dysfunction medications at least 3 days (72 hrs) prior to test.  On the Night Before the Test: Be sure to Drink plenty of water. Do not consume any caffeinated/decaffeinated beverages or chocolate 12 hours prior to your test. Do not take any antihistamines 12 hours prior to your test. If the patient has contrast allergy: Patient will need a prescription for Prednisone and very clear instructions (as follows): Prednisone 50 mg - take 13 hours prior to test Take another Prednisone 50 mg 7 hours prior to test Take another Prednisone 50 mg 1 hour prior to test Take Benadryl 50 mg 1 hour prior to test Patient must complete all four doses of above prophylactic medications. Patient will need a ride after test due to Benadryl.  On the Day of the Test: Drink plenty of water until 1 hour prior to the  test. Do not eat any food 4 hours prior to the test. You may take your regular medications prior to the test.  Take metoprolol (Lopressor) two hours prior to test. HOLD Furosemide/Hydrochlorothiazide morning of the test. FEMALES- please wear underwire-free bra if available, avoid dresses & tight clothing   *For Clinical Staff only. Please instruct patient the following:* Heart Rate Medication Recommendations for Cardiac CT   Resting HR > 65 bpm and BP >110/50 mmHG  Metoprolol tartrate 100 mg PO 90-120 minutes prior to scan       After the Test: Drink plenty of water. After receiving IV contrast, you may experience a mild flushed feeling. This is normal. On occasion, you may experience a mild rash up to 24 hours after the test. This is not dangerous. If this occurs, you can take Benadryl 25 mg and increase your fluid intake. If you experience trouble breathing, this can be serious. If it is severe call 911 IMMEDIATELY. If it is mild, please call our office. If you take any of these medications: Glipizide/Metformin, Avandament, Glucavance, please do not take 48 hours after completing test unless otherwise instructed.  We will call to schedule your test 2-4 weeks out understanding that some insurance companies will need an authorization prior to the service being performed.   For non-scheduling related questions, please contact the cardiac imaging nurse navigator should you have any questions/concerns: Marchia Bond, Cardiac Imaging Nurse Navigator Gordy Clement, Cardiac Imaging Nurse Navigator [facility] Direct Office Dial:  [phone]   For scheduling needs, including cancellations and rescheduling, please call [place], [phone].    Follow-Up: At [facility], you and your health needs are our priority.  As part of our continuing mission to provide you with exceptional heart care, we have created designated Provider Care Teams.  These Care Teams  include your primary Cardiologist (physician) and Advanced Practice Providers (APPs -  Physician Assistants and Nurse Practitioners) who all work together to provide you with the care you need, when you need it.  We recommend signing up for the patient portal called "MyChart".  Sign up information is provided on this After Visit Summary.  MyChart is used to connect with patients for Virtual Visits (Telemedicine).  Patients are able to view lab/test results, encounter notes, upcoming appointments, etc.  Non-urgent messages can be sent to your provider as well.   To learn more about what you can do with MyChart, go to [email or web address].    Your next appointment:   6 month(s)  The format for your next appointment:   In Person  Provider:   DR Ali Lowe    Other Instruction REFERRAL TO LIPID NONE

## 2021-08-16 ENCOUNTER — Other Ambulatory Visit (HOSPITAL_COMMUNITY): Payer: Self-pay

## 2021-08-19 ENCOUNTER — Other Ambulatory Visit (HOSPITAL_COMMUNITY): Payer: Self-pay

## 2021-08-24 ENCOUNTER — Ambulatory Visit: Payer: No Typology Code available for payment source | Admitting: Pharmacist

## 2021-08-24 ENCOUNTER — Other Ambulatory Visit: Payer: Self-pay

## 2021-08-24 ENCOUNTER — Other Ambulatory Visit (HOSPITAL_COMMUNITY): Payer: Self-pay

## 2021-08-24 VITALS — BP 142/90 | HR 91 | Ht 69.0 in | Wt 265.0 lb

## 2021-08-24 DIAGNOSIS — E785 Hyperlipidemia, unspecified: Secondary | ICD-10-CM | POA: Diagnosis not present

## 2021-08-24 DIAGNOSIS — I10 Essential (primary) hypertension: Secondary | ICD-10-CM | POA: Diagnosis not present

## 2021-08-24 DIAGNOSIS — E669 Obesity, unspecified: Secondary | ICD-10-CM

## 2021-08-24 MED ORDER — FENOFIBRATE 145 MG PO TABS
145.0000 mg | ORAL_TABLET | Freq: Every day | ORAL | 11 refills | Status: DC
Start: 2021-08-24 — End: 2021-12-06
  Filled 2021-08-24: qty 30, 30d supply, fill #0
  Filled 2021-09-20: qty 30, 30d supply, fill #1
  Filled 2021-10-24: qty 30, 30d supply, fill #2
  Filled 2021-11-21: qty 90, 90d supply, fill #3

## 2021-08-24 MED ORDER — HYDROCHLOROTHIAZIDE 25 MG PO TABS
25.0000 mg | ORAL_TABLET | Freq: Every day | ORAL | 3 refills | Status: DC
  Filled 2021-08-24: qty 90, 90d supply, fill #0
  Filled 2021-11-21: qty 90, 90d supply, fill #1
  Filled 2022-04-18: qty 90, 90d supply, fill #2
  Filled 2022-05-23 – 2022-07-28 (×2): qty 90, 90d supply, fill #3

## 2021-08-24 MED ORDER — FENOFIBRATE 145 MG PO TABS
145.0000 mg | ORAL_TABLET | Freq: Every day | ORAL | 3 refills | Status: DC
  Filled 2021-08-24: qty 90, 90d supply, fill #0

## 2021-08-24 MED ORDER — EZETIMIBE 10 MG PO TABS
10.0000 mg | ORAL_TABLET | Freq: Every day | ORAL | 11 refills | Status: DC
  Filled 2021-08-24: qty 30, 30d supply, fill #0
  Filled 2021-09-20: qty 30, 30d supply, fill #1
  Filled 2021-10-24: qty 30, 30d supply, fill #2
  Filled 2021-11-21: qty 90, 90d supply, fill #3
  Filled 2022-04-08: qty 90, 90d supply, fill #4
  Filled 2022-05-23 – 2022-07-28 (×2): qty 90, 90d supply, fill #5

## 2021-08-24 MED ORDER — EZETIMIBE 10 MG PO TABS
10.0000 mg | ORAL_TABLET | Freq: Every day | ORAL | 3 refills | Status: DC
  Filled 2021-08-24: qty 90, 90d supply, fill #0

## 2021-08-24 NOTE — Patient Instructions (Addendum)
It was nice to see you today!  Cholesterol: Your triglycerides are 520 and your goal is < 150 Your LDL cholesterol is 121 and your goal is < 100 Start taking ezetimibe 10mg  1 tablet daily Start taking fenofibrate 145mg  1 tablet daily  Blood pressure: Your goal is < 130/15mmHg Increase your hydrochlorothiazide from 12.5mg  to 25mg  1 tablet daily Continue taking amlodipine 10mg  1 tablet daily  Recheck fasting lab work on Wednesday, March 22nd any time after 7:30am

## 2021-08-24 NOTE — Progress Notes (Signed)
Patient ID: Jim Hunter                 DOB: April 02, 1966                    MRN: [id number]     HPI: Jim Hunter is a 56 y.o. male patient referred to lipid clinic by Dr Ali Lowe. PMH is significant for DOE, OSA not on CPAP, obesity, HTN, HLD, preDM, and ED. He was scheduled for coronary CTA on 2/20 due to his DOE. Also referred to PharmD for lipid and weight loss management.  Pt presents today for follow up. Reports tolerating medications well. Previously experienced hives in both rosuvastatin and atorvastatin within a month of taking each med. Rash resolved after he stopped the statins in each case. Doesn't think he was fasting when he had lipids checked in December. Has gained 30-40 lbs since changing from 2nd to 3rd shift at [employer], reports no changes in diet or exercise. Is on his feet at work but no formal exercise outside of that. Likes red meat and carbs, has already worked to cut back on salt, pork, and sugar in his diet and doesn't feel he can make more dietary changes easily.  Current Lipid meds: none Intolerances: rosuvastatin, atorvastatin - hives Risk Factors: 10 year ASCVD risk 14.1% LDL goal: 100mg /dL  Current BP meds: amlodipine 10mg  daily, HCTZ 12.5mg  daily Intolerances: lisinopril - cough, losartan - myalgias BP goal: < 130/28mmHg  Diet: Breakfast - beans, eggs, meat. Night - meat, tortillas, rice. Likes red meat and carbs. Drinks water. No soda. 2 beers a few days a week. Cut back on pork, salt and sugar.  Exercise: On his feet at work, no other formal exercise  Family History: Mother, brother, sister, and maternal grandmother with HTN  Social History: Former tobacco use, 1 PPD for 10 years, denies drug use, occasional alcohol use.  Labs: 07/06/21: TC 243, TG 520, HDL 29, LDL 121 (no LLT)  Past Medical History:  Diagnosis Date   Arthritis    GERD (gastroesophageal reflux disease)    Hx of adenomatous polyp of colon 02/03/2019   Hyperlipidemia    Hypertension     PPD screening test last 9 years ago first started as child   treated as child for positive ppd test   Sleep apnea 3 years ago   no borderline no cpap needed per sleep studty done    Current Outpatient Medications on File Prior to Visit  Medication Sig Dispense Refill   amLODipine (NORVASC) 10 MG tablet Take 1 tablet (10 mg total) by mouth at bedtime. 90 tablet 0   baclofen (LIORESAL) 20 MG tablet Take 1 tablet (20 mg total) by mouth 3 (three) times daily. (Patient not taking: Reported on 08/08/2021) 30 tablet 0   diclofenac Sodium (VOLTAREN) 1 % GEL APPLY 2 GRAMS TOPICALLY FOUR TIMES DAILY. (Patient not taking: Reported on 03/15/2021) 300 g 1   esomeprazole (NEXIUM) 40 MG capsule Take 1 capsule (40 mg total) by mouth daily. 90 capsule 2   fluticasone (FLONASE) 50 MCG/ACT nasal spray Place 2 sprays into both nostrils daily. 16 g 6   gabapentin (NEURONTIN) 100 MG capsule TAKE 1 CAPSULE BY MOUTH THREE TIMES DAILY. (Patient not taking: Reported on 03/15/2021) 90 capsule 3   hydrochlorothiazide (MICROZIDE) 12.5 MG capsule Take 1 capsule (12.5 mg total) by mouth daily. 30 capsule 1   HYDROcodone-acetaminophen (NORCO/VICODIN) 5-325 MG tablet Take 1 tablet by mouth every 6 (six) hours as  needed for pain (Patient not taking: Reported on 03/15/2021) 40 tablet 0   Ibuprofen (ADVIL) 200 MG CAPS Take 400-600 mg by mouth every 6 (six) hours as needed (pain).     metoprolol tartrate (LOPRESSOR) 100 MG tablet Take 1 tablet (100 mg total) by mouth once for 1 dose. Take 90-120 minutes prior to scan. 1 tablet 0   sildenafil (VIAGRA) 100 MG tablet Take 1 tablet (100 mg total) by mouth daily as needed. 10 tablet 0   [DISCONTINUED] lisinopril-hydrochlorothiazide (ZESTORETIC) 20-12.5 MG tablet TAKE 1 TABLET BY MOUTH DAILY. 30 tablet 0   [DISCONTINUED] losartan (COZAAR) 25 MG tablet Take 1 tablet (25 mg total) by mouth at bedtime. 90 tablet 3   [DISCONTINUED] omeprazole (PRILOSEC) 20 MG capsule TAKE 1 CAPSULE BY MOUTH  ONCE DAILY 90 capsule PRN   No current facility-administered medications on file prior to visit.    Allergies  Allergen Reactions   Crestor [Rosuvastatin] Hives   Lipitor [Atorvastatin Calcium] Hives   Lisinopril Cough   Losartan Other (See Comments)    Myalgias     Assessment/Plan:  1. Hyperlipidemia - TG 520 above goal < 150, LDL 121 above goal < 100 for primary prevention with elevated 10 year ASCVD risk. LDL goal may change pending results of upcoming coronary CTA. Intolerant to 2 statins (rash). Will start ezetimibe 10mg  daily and fenofibrate 145mg  daily.  2. Hypertension - BP elevated above goal < 130/67mmHg. Will increase HCTZ from 12.5mg  to 25mg  daily and continue amlodipine 10mg  daily.  3. Obesity - Briefly discussed weight loss meds (GLP1RA, [facility] insurance covers [phone] well). Pt does not wish to start medication for weight loss due to weight rebound seen after med discontinuation. Reviewed heart healthy diet at today's visit.  Will recheck lipids and CMET in 6 weeks.  Angeleah Labrake E. Atticus Lemberger, PharmD, BCACP, [place] [address] [place], [place] [address] Phone: [phone]; Fax: [phone] 08/24/2021 9:09 AM

## 2021-09-02 ENCOUNTER — Telehealth (HOSPITAL_COMMUNITY): Payer: Self-pay | Admitting: *Deleted

## 2021-09-02 NOTE — Telephone Encounter (Signed)
Patient returning call regarding upcoming cardiac imaging study; pt verbalizes understanding of appt date/time, parking situation and where to check in, pre-test NPO status and medications ordered, and verified current allergies; name and call back number provided for further questions should they arise  Gordy Clement RN Navigator Cardiac Imaging [facility] [phone] office [phone] cell  Patient to take 100mg  metoprolol tartrate two hours prior to his cardiac CT scan. He is aware to arrive at 8:30am for his 9am scan.

## 2021-09-02 NOTE — Telephone Encounter (Signed)
Attempted to call patient regarding upcoming cardiac CT appointment. °Left message on voicemail with name and callback number ° °Breyona Swander RN Navigator Cardiac Imaging °[facility] °[phone] Office °[phone] Cell ° °

## 2021-09-05 ENCOUNTER — Ambulatory Visit (HOSPITAL_COMMUNITY)
Admission: RE | Admit: 2021-09-05 | Discharge: 2021-09-05 | Disposition: A | Discharge status: Home / Self Care | Payer: No Typology Code available for payment source | Source: Ambulatory Visit | Attending: Internal Medicine | Admitting: Internal Medicine

## 2021-09-05 ENCOUNTER — Other Ambulatory Visit: Payer: Self-pay

## 2021-09-05 DIAGNOSIS — R0600 Dyspnea, unspecified: Secondary | ICD-10-CM | POA: Insufficient documentation

## 2021-09-05 MED ORDER — IOHEXOL 350 MG/ML SOLN
100.0000 mL | Freq: Once | INTRAVENOUS | Status: AC | PRN
  Administered 2021-09-05: 100 mL via INTRAVENOUS

## 2021-09-05 MED ORDER — NITROGLYCERIN 0.4 MG SL SUBL
0.8000 mg | SUBLINGUAL_TABLET | Freq: Once | SUBLINGUAL | Status: AC
  Administered 2021-09-05: 0.8 mg via SUBLINGUAL

## 2021-09-05 MED ORDER — NITROGLYCERIN 0.4 MG SL SUBL
SUBLINGUAL_TABLET | SUBLINGUAL | Status: AC
  Filled 2021-09-05: qty 2

## 2021-09-20 ENCOUNTER — Other Ambulatory Visit (HOSPITAL_COMMUNITY): Payer: Self-pay

## 2021-09-28 ENCOUNTER — Other Ambulatory Visit: Payer: Self-pay | Admitting: Internal Medicine

## 2021-09-28 DIAGNOSIS — R0600 Dyspnea, unspecified: Secondary | ICD-10-CM

## 2021-09-29 ENCOUNTER — Other Ambulatory Visit (HOSPITAL_COMMUNITY): Payer: Self-pay

## 2021-09-30 ENCOUNTER — Other Ambulatory Visit (HOSPITAL_COMMUNITY): Payer: Self-pay

## 2021-10-05 ENCOUNTER — Other Ambulatory Visit: Payer: No Typology Code available for payment source | Admitting: *Deleted

## 2021-10-05 ENCOUNTER — Other Ambulatory Visit: Payer: Self-pay

## 2021-10-05 DIAGNOSIS — E785 Hyperlipidemia, unspecified: Secondary | ICD-10-CM

## 2021-10-05 DIAGNOSIS — I10 Essential (primary) hypertension: Secondary | ICD-10-CM

## 2021-10-05 LAB — COMPREHENSIVE METABOLIC PANEL
ALT: 54 IU/L — ABNORMAL HIGH (ref 0–44)
AST: 31 IU/L (ref 0–40)
Albumin/Globulin Ratio: 1.5 (ref 1.2–2.2)
Albumin: 4.3 g/dL (ref 3.8–4.9)
Alkaline Phosphatase: 66 IU/L (ref 44–121)
BUN/Creatinine Ratio: 14 (ref 9–20)
BUN: 16 mg/dL (ref 6–24)
Bilirubin Total: 0.4 mg/dL (ref 0.0–1.2)
CO2: 25 mmol/L (ref 20–29)
Calcium: 9.4 mg/dL (ref 8.7–10.2)
Chloride: 101 mmol/L (ref 96–106)
Creatinine, Ser: 1.11 mg/dL (ref 0.76–1.27)
Globulin, Total: 2.8 g/dL (ref 1.5–4.5)
Glucose: 140 mg/dL — ABNORMAL HIGH (ref 70–99)
Potassium: 3.5 mmol/L (ref 3.5–5.2)
Sodium: 140 mmol/L (ref 134–144)
Total Protein: 7.1 g/dL (ref 6.0–8.5)
eGFR: 78 mL/min/{1.73_m2} (ref >59)

## 2021-10-05 LAB — LIPID PANEL
Chol/HDL Ratio: 6.1 ratio — ABNORMAL HIGH (ref 0.0–5.0)
Cholesterol, Total: 177 mg/dL (ref 100–199)
HDL: 29 mg/dL — ABNORMAL LOW (ref >39)
LDL Chol Calc (NIH): 110 mg/dL — ABNORMAL HIGH (ref 0–99)
Triglycerides: 218 mg/dL — ABNORMAL HIGH (ref 0–149)
VLDL Cholesterol Cal: 38 mg/dL (ref 5–40)

## 2021-10-11 ENCOUNTER — Telehealth: Payer: Self-pay | Admitting: Pharmacist

## 2021-10-11 NOTE — Telephone Encounter (Signed)
BMET stable after increasing HCTZ dose for BP. Lipids improved TG 520 >> 218 and LDL 121 >> 110 after starting fenofibrate and ezetimibe. LDL goal < 100 (CAC score of 0 on 09/05/21). ? ?Left message for pt, will continue current lipid meds and advise pt to eat a heart healthy diet to help further reduce LDL and TG. Will need to see how home BP readings have been looking on dose increase of HCTZ. Can add ACEi or ARB if needed. ?

## 2021-10-13 NOTE — Telephone Encounter (Signed)
Pt returned call. Hasn't been checking BP readings but will start. I'll call him in 2-3 weeks to assess. ?

## 2021-10-13 NOTE — Telephone Encounter (Signed)
2nd message left for pt.

## 2021-10-24 ENCOUNTER — Other Ambulatory Visit (HOSPITAL_COMMUNITY): Payer: Self-pay

## 2021-10-24 ENCOUNTER — Other Ambulatory Visit: Payer: Self-pay | Admitting: Family Medicine

## 2021-10-24 DIAGNOSIS — I10 Essential (primary) hypertension: Secondary | ICD-10-CM

## 2021-10-24 MED ORDER — AMLODIPINE BESYLATE 10 MG PO TABS
10.0000 mg | ORAL_TABLET | Freq: Every day | ORAL | 0 refills | Status: DC
  Filled 2021-10-24: qty 90, 90d supply, fill #0

## 2021-11-02 NOTE — Telephone Encounter (Signed)
Called pt for BP readings. States he still has not started checking but that he will tonight at work. Advised pt I will call him in 1 week and he should have at least 3-4 BP readings by then. If not, he will need in office appt as he has continued to not monitor BP and may require med change. ?

## 2021-11-03 ENCOUNTER — Other Ambulatory Visit (HOSPITAL_COMMUNITY): Payer: Self-pay

## 2021-11-03 MED ORDER — OMRON 3 SERIES BP MONITOR DEVI
0 refills | Status: DC
  Filled 2021-11-03: qty 1, 1d supply, fill #0

## 2021-11-09 ENCOUNTER — Telehealth: Payer: Self-pay | Admitting: Pharmacist

## 2021-11-09 ENCOUNTER — Other Ambulatory Visit (HOSPITAL_COMMUNITY): Payer: Self-pay

## 2021-11-09 MED ORDER — VALSARTAN 160 MG PO TABS
160.0000 mg | ORAL_TABLET | Freq: Every day | ORAL | 5 refills | Status: DC
  Filled 2021-11-09: qty 30, 30d supply, fill #0

## 2021-11-09 NOTE — Telephone Encounter (Signed)
Called pt to check home BP readings. Checks in the AM and PM. 187/90s on average. Reports compliance with amlodipine and HCTZ. Will start valsartan '160mg'$  daily. Scheduled f/u with me in 3-4 weeks for BP check and BMET. Advised pt to bring in home cuff and readings to that visit. ?

## 2021-11-21 ENCOUNTER — Other Ambulatory Visit (HOSPITAL_COMMUNITY): Payer: Self-pay

## 2021-12-06 ENCOUNTER — Ambulatory Visit (INDEPENDENT_AMBULATORY_CARE_PROVIDER_SITE_OTHER): Payer: No Typology Code available for payment source | Admitting: Pharmacist

## 2021-12-06 VITALS — BP 108/86 | HR 80

## 2021-12-06 DIAGNOSIS — I10 Essential (primary) hypertension: Secondary | ICD-10-CM

## 2021-12-06 DIAGNOSIS — E785 Hyperlipidemia, unspecified: Secondary | ICD-10-CM

## 2021-12-06 LAB — BASIC METABOLIC PANEL
BUN/Creatinine Ratio: 17 (ref 9–20)
BUN: 26 mg/dL — ABNORMAL HIGH (ref 6–24)
CO2: 22 mmol/L (ref 20–29)
Calcium: 9.8 mg/dL (ref 8.7–10.2)
Chloride: 102 mmol/L (ref 96–106)
Creatinine, Ser: 1.49 mg/dL — ABNORMAL HIGH (ref 0.76–1.27)
Glucose: 94 mg/dL (ref 70–99)
Potassium: 4.1 mmol/L (ref 3.5–5.2)
Sodium: 141 mmol/L (ref 134–144)
eGFR: 55 mL/min/{1.73_m2} — ABNORMAL LOW (ref >59)

## 2021-12-06 NOTE — Patient Instructions (Addendum)
It was great to see you today! Your blood pressure goal is <130/80 mmHg. Your home BP cuff may be reading slightly higher than our clinic cuff, but it was typically within 5-7 points.   Continue all your blood pressure medications as prescribed - amlodipine 10 mg daily - hydrochlorothiazide 25 mg daily - valsartan 160 mg daily  If you are consistently noticing your blood pressure (top number) is less than 105, give [place] a call.   Since you are having some muscle tightness/discomfort, you can try cutting your fenofibrate 145 mg tablet in half. See if you notice improvement in muscle symptoms. We will give you a call in a few weeks to check in.  - Start taking fenofibrate 1/2 tablet once daily - Continue ezetimibe 10 mg daily

## 2021-12-06 NOTE — Progress Notes (Signed)
Patient ID: Jim Hunter                 DOB: 05-29-1966                    MRN: [id number]     HPI: Jim Hunter is a 56 y.o. male patient referred to PharmD clinic by Jim Hunter for HTN and lipid management. PMH is significant for DOE, OSA not on CPAP, obesity, HTN, HLD, preDM (A1c 6.3% 06/2021), and ED. Coronary CT to investigate DOE on 2/20 had no significant findings, calcium score was 0, mildly dilated ascending thoracic aorta. Also referred to PharmD for lipid and weight loss management. At last PharmD appt on 09/05/21 pt advised pt to increase HCTZ from 12.5 mg to 25 mg daily and start checking home BP. Also started ezetimibe 10 mg daily and fenofibrate 145 mg daily for TG elevated to 520. On repeat lipid panel on 10/05/21, TG decreased to 218. On 11/09/21 pt reported via telephone that home BP averaging 187/90s. Started valsartan 160 mg daily and scheduled f/u for BP check and BMET in 3-4 weeks.  Pt presents today for follow up with home blood pressure cuff (Omron). Denies noticeable AE after starting valsartan 160 mg daily on 11/18/22, though does endorse bilateral leg discomfort in both thighs which started ~1-2 weeks ago. Also adherent to HCTZ and amlodipine. Also feels that he may have some fluid in his ankles, and shoes are bit uncomfortable, though pt appears euvolemic on exam today. Has cut a lot of salt out of diet, only using once in a while, still working on reducing to less than 2000 mg/day. He checks his BP in the mornings after he gets home from work (3rd shift), about 1 hour after eating breakfast and taking his medications. Denies any dizziness, lightheadedness, blurred vision. Still gets SOB with exertion and has occasional HA which he attributes to being tired. Reports adherence to ezetimibe 10 mg daily and fenofibrate 145 mg daily consistent with dispense report. Of note, previously experienced hives with both rosuvastatin and atorvastatin within a month of taking each med. Rash resolved  after he stopped the statins in each case. Doesn't think he was fasting when he had lipids checked in December.  Current Lipid meds: none Intolerances: rosuvastatin, atorvastatin - hives Risk Factors: 10 year ASCVD risk 14.1% LDL goal: '100mg'$ /dL  Current BP meds: amlodipine '10mg'$  daily, HCTZ '25mg'$  daily, valsartan 160 mg daily Intolerances: lisinopril - cough, losartan - myalgias BP goal: < 130/34mHg  Home BP:  5/1: 128/99, 5/2: 128/88, 5/3: 113/85, 5/4: 114/79 After valsartan dispensed on 5/4:  5/5: 105/74 5/6: 115/86 5/7: 108/84 5/8: 113/84 5/9: 109/77 5/10: 117/88 5/11: 102/77 5/12: 104/81 5/13: 112/81 5/14: 121/89 5/15: 123/77 5/17: 113/87 5/19: 113/88 5/20: 121/66 5/21: 105/80  Clinic BP:  Home cuff: 119/88, repeat: 115/80 Clinic cuff: 108/86  Diet: Breakfast - beans, eggs, meat. Night - meat, tortillas, rice. Likes red meat and carbs. Drinks water. No soda. 2 beers a few days a week. Cut back on pork, salt and sugar.  Exercise: On his feet at work, no other formal exercise  Family History: Mother, brother, sister, and maternal grandmother with HTN  Social History: Former tobacco use, 1 PPD for 10 years, denies drug use, occasional alcohol use.  Labs: 10/05/21: TC 177, TG 218, HDL 29, VLDL 38, LDL-calc 110, Chol/HDL 6.1 (ezetimibe 10 mg daily, fenofibrate 145 mg daily) 07/06/21: TC 243, TG 520, HDL 29, LDL 121 (no LLT)  Past Medical History:  Diagnosis Date   Arthritis    GERD (gastroesophageal reflux disease)    Hx of adenomatous polyp of colon 02/03/2019   Hyperlipidemia    Hypertension    PPD screening test last 9 years ago first started as child   treated as child for positive ppd test   Sleep apnea 3 years ago   no borderline no cpap needed per sleep studty done    Current Outpatient Medications on File Prior to Visit  Medication Sig Dispense Refill   amLODipine (NORVASC) 10 MG tablet Take 1 tablet (10 mg total) by mouth at bedtime. 90 tablet 0    Blood Pressure Monitoring (OMRON 3 SERIES BP MONITOR) DEVI Use as directed 1 each 0   esomeprazole (NEXIUM) 40 MG capsule Take 1 capsule (40 mg total) by mouth daily. (Patient taking differently: Take 40 mg by mouth as needed.) 90 capsule 2   ezetimibe (ZETIA) 10 MG tablet Take 1 tablet (10 mg total) by mouth daily. 30 tablet 11   fenofibrate (TRICOR) 145 MG tablet Take 1 tablet (145 mg total) by mouth daily. 30 tablet 11   fluticasone (FLONASE) 50 MCG/ACT nasal spray Place 2 sprays into both nostrils daily. (Patient taking differently: Place 2 sprays into both nostrils as needed.) 16 g 6   hydrochlorothiazide (HYDRODIURIL) 25 MG tablet Take 1 tablet (25 mg total) by mouth daily. 90 tablet 3   Ibuprofen 200 MG CAPS Take 400-600 mg by mouth every 6 (six) hours as needed (pain).     sildenafil (VIAGRA) 100 MG tablet Take 1 tablet (100 mg total) by mouth daily as needed. (Patient not taking: Reported on 09/05/2021) 10 tablet 0   valsartan (DIOVAN) 160 MG tablet Take 1 tablet (160 mg total) by mouth daily. 30 tablet 5   [DISCONTINUED] lisinopril-hydrochlorothiazide (ZESTORETIC) 20-12.5 MG tablet TAKE 1 TABLET BY MOUTH DAILY. 30 tablet 0   [DISCONTINUED] losartan (COZAAR) 25 MG tablet Take 1 tablet (25 mg total) by mouth at bedtime. 90 tablet 3   [DISCONTINUED] omeprazole (PRILOSEC) 20 MG capsule TAKE 1 CAPSULE BY MOUTH ONCE DAILY 90 capsule PRN   No current facility-administered medications on file prior to visit.    Allergies  Allergen Reactions   Crestor [Rosuvastatin] Hives   Lipitor [Atorvastatin Calcium] Hives   Lisinopril Cough   Losartan Other (See Comments)    Myalgias     Assessment/Plan:  1. Hypertension - Clinic BP mostly controlled at 108/86 (clinic cuff) and 115/80 (home cuff) for goal < 130/80 mmHg, consistent with home BP readings which average to 112/81 mmHg after starting valsartan. Will continue amlodipine '10mg'$  daily, HCTZ '25mg'$  daily, valsartan 160 mg daily as pt does not  report s/sx of hypotension. Encouraged continued improvement in reducing salt intake and increasing physical activity/weight loss to lower DBP. Instructed pt to contact clinic if SBP consistently <105 or pt starts experiencing dizziness/lightheadedness, as reduction in BP medication may be warranted. Additionally instructed pt to monitor for fluid accumulation in his lower extremities. Will obtain BMP today to evaluate Scr and potassium after starting valsartan 160 mg on 5/4 and release results in MyChart. Will schedule follow-up as needed.   2. Hyperlipidemia - TG 218 above goal < 150 mg/dL, though improved from 520 after starting fenofibrate 145 mg daily and ezetimibe 10 mg daily. Since pt reports bilateral large muscle discomfort in his quads today, instructed pt to start taking fenofibrate 72.5 mg (1/2 tablet) daily and monitor for improvement in muscle symptoms. Will  call pt in a few weeks to follow-up. LDL-C 110 above goal < 100 mg/dL for primary prevention with elevated 10 year ASCVD risk and CAC score of 0. Intolerant to 2 statins (rash). Encouraged pt to adhere to [place] and increase physical activity to promote further LDL-lowering to goal. Will schedule follow-up as needed.   Patient seen with Park Liter, PY4 PharmD Candidate  Megan E. Supple, PharmD, BCACP, [place] [address], [place], [place] [address] Phone: [phone]; Fax: [phone] 12/06/2021 8:11 AM

## 2021-12-07 DIAGNOSIS — I10 Essential (primary) hypertension: Secondary | ICD-10-CM

## 2021-12-07 NOTE — Addendum Note (Signed)
Addended by: Marcelle Overlie D on: 12/07/2021 09:43 AM   Modules accepted: Orders

## 2021-12-19 ENCOUNTER — Other Ambulatory Visit: Payer: No Typology Code available for payment source

## 2021-12-20 ENCOUNTER — Encounter: Payer: Self-pay | Admitting: *Deleted

## 2021-12-20 ENCOUNTER — Other Ambulatory Visit: Payer: No Typology Code available for payment source

## 2021-12-20 DIAGNOSIS — I10 Essential (primary) hypertension: Secondary | ICD-10-CM

## 2021-12-20 LAB — BASIC METABOLIC PANEL
BUN/Creatinine Ratio: 18 (ref 9–20)
BUN: 25 mg/dL — ABNORMAL HIGH (ref 6–24)
CO2: 23 mmol/L (ref 20–29)
Calcium: 9.9 mg/dL (ref 8.7–10.2)
Chloride: 102 mmol/L (ref 96–106)
Creatinine, Ser: 1.38 mg/dL — ABNORMAL HIGH (ref 0.76–1.27)
Glucose: 86 mg/dL (ref 70–99)
Potassium: 4 mmol/L (ref 3.5–5.2)
Sodium: 140 mmol/L (ref 134–144)
eGFR: 60 mL/min/{1.73_m2} (ref >59)

## 2021-12-21 ENCOUNTER — Telehealth: Payer: Self-pay | Admitting: Pharmacist

## 2021-12-21 ENCOUNTER — Other Ambulatory Visit (HOSPITAL_COMMUNITY): Payer: Self-pay

## 2021-12-21 DIAGNOSIS — E785 Hyperlipidemia, unspecified: Secondary | ICD-10-CM

## 2021-12-21 DIAGNOSIS — I10 Essential (primary) hypertension: Secondary | ICD-10-CM

## 2021-12-21 MED ORDER — OMEGA-3-ACID ETHYL ESTERS 1 G PO CAPS
2.0000 g | ORAL_CAPSULE | Freq: Two times a day (BID) | ORAL | 11 refills | Status: AC
  Filled 2021-12-21: qty 120, 30d supply, fill #0
  Filled 2022-05-11: qty 120, 30d supply, fill #1

## 2021-12-21 NOTE — Telephone Encounter (Signed)
Called pt to discuss lab results and MyChart message. SCr improving, not fully back to baseline. Pt stopped  his valsartan and fenofibrate a week ago due to muscle pain. Reports he took 3 doses of the lower 1/2 tablet of fenofibrate before stopping. Muscle aches have improved. Also had some SOB around that time that has improved as well. Home BP was 117/84 yesterday. Noted some swelling in his legs however this was the day after he drank a lot of alcohol. Will start Lovaza 2g BID for his TG (> 500 at baseline and now off of fenofibrate which had brought them down to the low 200s). He'll continue to keep an eye on his BP at home and call if readings are consistently > 130/6mHg. Scheduled PharmD follow up in 5 weeks for BP check and repeat CMET and lipid panel.

## 2021-12-21 NOTE — Telephone Encounter (Signed)
Called pt to discuss, see separate phone note.

## 2022-01-26 ENCOUNTER — Ambulatory Visit (INDEPENDENT_AMBULATORY_CARE_PROVIDER_SITE_OTHER): Payer: No Typology Code available for payment source | Admitting: Pharmacist

## 2022-01-26 ENCOUNTER — Other Ambulatory Visit: Payer: No Typology Code available for payment source

## 2022-01-26 VITALS — BP 144/92 | HR 83

## 2022-01-26 DIAGNOSIS — E785 Hyperlipidemia, unspecified: Secondary | ICD-10-CM | POA: Diagnosis not present

## 2022-01-26 DIAGNOSIS — I10 Essential (primary) hypertension: Secondary | ICD-10-CM

## 2022-01-26 NOTE — Patient Instructions (Signed)
Your blood pressure goal is < 130/61mHg  Restart your amlodipine first, then restart the HCTZ if your blood pressure remains above goal  Your LDL cholesterol goal is < 100 and your triglyceride goal is < 150  Restart your ezetimibe next, then restart your Lovaza (fish oil)  Follow up with your primary care doctor

## 2022-01-26 NOTE — Progress Notes (Signed)
Patient ID: Jim Hunter                 DOB: 1965-09-08                    MRN: [id number]     HPI: Jim Hunter is a 56 y.o. male patient referred to PharmD clinic by Dr Ali Lowe for HTN and lipid management. PMH is significant for DOE, OSA not on CPAP, obesity, HTN, HLD, preDM (A1c 6.3% 06/2021), and ED. Coronary CT to investigate DOE on 2/20 had no significant findings, calcium score was 0, mildly dilated ascending thoracic aorta. At past few visits with PharmD, HCTZ has been increased and valsartan, ezetimibe, and fenofibrate were started. TG since improved from 520 to 218. Fenofibrate dose cut in half as pt reported muscle aches in his legs. He then reported stopping both fenofibrate and valsartan due to leg pain. He was started on Lovaza instead for his TG and presents today for follow up and labs.  Pt reports stopping all of his medications last week. Frustrated that he was referred to cardiology for SOB and has been started on new medications and his SOB hasn't improved. Still has pain/cramping. Improved a bit after stopping fenofibrate and valsartan but has not resolved and reports hip pain now as well as itching. Also frustrated that specialty office copays are higher than PCP.  Current Lipid meds: Lovaza 2g BID, ezetimibe '10mg'$  daily - stopped both last week Intolerances: rosuvastatin, atorvastatin - hives, fenofibrate - muscle cramping Risk Factors: 10 year ASCVD risk 14.1% LDL goal: '100mg'$ /dL  Current BP meds: amlodipine '10mg'$  daily, HCTZ '25mg'$  daily - stopped both last week Intolerances: lisinopril - cough, losartan and valsartan - myalgias BP goal: < 130/24mHg  Diet: Breakfast - beans, eggs, meat. Night - meat, tortillas, rice. Likes red meat and carbs. Drinks water. No soda. 2 beers a few days a week. Cut back on pork, salt and sugar.  Exercise: On his feet at work, no other formal exercise  Family History: Mother, brother, sister, and maternal grandmother with HTN  Social  History: Former tobacco use, 1 PPD for 10 years, denies drug use, occasional alcohol use.  Labs: 10/05/21: TC 177, TG 218, HDL 29, VLDL 38, LDL-calc 110 (ezetimibe 10 mg daily, fenofibrate 145 mg daily) 07/06/21: TC 243, TG 520, HDL 29, LDL 121 (no LLT)  Past Medical History:  Diagnosis Date   Arthritis    GERD (gastroesophageal reflux disease)    Hx of adenomatous polyp of colon 02/03/2019   Hyperlipidemia    Hypertension    PPD screening test last 9 years ago first started as child   treated as child for positive ppd test   Sleep apnea 3 years ago   no borderline no cpap needed per sleep studty done    Current Outpatient Medications on File Prior to Visit  Medication Sig Dispense Refill   amLODipine (NORVASC) 10 MG tablet Take 1 tablet (10 mg total) by mouth at bedtime. 90 tablet 0   Blood Pressure Monitoring (OMRON 3 SERIES BP MONITOR) DEVI Use as directed 1 each 0   esomeprazole (NEXIUM) 40 MG capsule Take 1 capsule (40 mg total) by mouth daily. (Patient taking differently: Take 40 mg by mouth as needed.) 90 capsule 2   ezetimibe (ZETIA) 10 MG tablet Take 1 tablet (10 mg total) by mouth daily. 30 tablet 11   fluticasone (FLONASE) 50 MCG/ACT nasal spray Place 2 sprays into both nostrils daily. (Patient taking differently:  Place 2 sprays into both nostrils as needed.) 16 g 6   hydrochlorothiazide (HYDRODIURIL) 25 MG tablet Take 1 tablet (25 mg total) by mouth daily. 90 tablet 3   Ibuprofen 200 MG CAPS Take 400-600 mg by mouth every 6 (six) hours as needed (pain).     omega-3 acid ethyl esters (LOVAZA) 1 g capsule Take 2 capsules (2 g total) by mouth 2 (two) times daily. 120 capsule 11   sildenafil (VIAGRA) 100 MG tablet Take 1 tablet (100 mg total) by mouth daily as needed. (Patient not taking: Reported on 09/05/2021) 10 tablet 0   [DISCONTINUED] lisinopril-hydrochlorothiazide (ZESTORETIC) 20-12.5 MG tablet TAKE 1 TABLET BY MOUTH DAILY. 30 tablet 0   [DISCONTINUED] losartan (COZAAR) 25  MG tablet Take 1 tablet (25 mg total) by mouth at bedtime. 90 tablet 3   [DISCONTINUED] omeprazole (PRILOSEC) 20 MG capsule TAKE 1 CAPSULE BY MOUTH ONCE DAILY 90 capsule PRN   No current facility-administered medications on file prior to visit.    Allergies  Allergen Reactions   Crestor [Rosuvastatin] Hives   Fenofibrate     Muscle aches   Lipitor [Atorvastatin Calcium] Hives   Lisinopril Cough   Losartan Other (See Comments)    Myalgias    Valsartan     Muscle aches    Assessment/Plan:  1. Hypertension - BP elevated above goal < 130/57mHg secondary to pt stopping all medications last week. Advised pt to restart his amlodipine and monitor BP for the next 1-2 weeks. If readings remain above goal, then to restart HCTZ next. Advised him to f/u with PCP due to lower office copays per pt request.  2. Hyperlipidemia - LDL 110 above goal < 100, baseline TG 520 above goal < 150 mg/dL, improved to the low 200s on fenofibrate but pt stopped secondary to muscle cramping. Was started on Lovaza but stopped this last week along with the rest of his meds. Encouraged him to resume this as well as his ezetimibe. Previously had hives on rosuvastatin and atorvastatin.  3. SOB - pt frustrated that he was referred to cardiology for SOB and this has not improved. Last seen by Dr TAli Lowein January, dyspnea felt to be secondary to untreated OSA, prior tobacco use, and weight gain. Coronary CTA was normal. He is aware to f/u with PCP as SOB not felt to be cardiac in nature.  Elyon Zoll E. Ian Cavey, PharmD, BCACP, [place] [place] [address]Phone: [phone] Fax: [phone]/13/2023 7:20 AM

## 2022-02-22 ENCOUNTER — Encounter: Payer: Self-pay | Admitting: Student

## 2022-02-22 ENCOUNTER — Ambulatory Visit (INDEPENDENT_AMBULATORY_CARE_PROVIDER_SITE_OTHER): Payer: No Typology Code available for payment source | Admitting: Student

## 2022-02-22 VITALS — BP 126/81 | HR 85 | Ht 69.0 in | Wt 277.6 lb

## 2022-02-22 DIAGNOSIS — N189 Chronic kidney disease, unspecified: Secondary | ICD-10-CM

## 2022-02-22 DIAGNOSIS — E669 Obesity, unspecified: Secondary | ICD-10-CM

## 2022-02-22 DIAGNOSIS — D509 Iron deficiency anemia, unspecified: Secondary | ICD-10-CM | POA: Diagnosis not present

## 2022-02-22 DIAGNOSIS — G4730 Sleep apnea, unspecified: Secondary | ICD-10-CM

## 2022-02-22 DIAGNOSIS — R7303 Prediabetes: Secondary | ICD-10-CM

## 2022-02-22 DIAGNOSIS — R0609 Other forms of dyspnea: Secondary | ICD-10-CM

## 2022-02-22 DIAGNOSIS — E785 Hyperlipidemia, unspecified: Secondary | ICD-10-CM

## 2022-02-22 DIAGNOSIS — I10 Essential (primary) hypertension: Secondary | ICD-10-CM

## 2022-02-22 LAB — POCT GLYCOSYLATED HEMOGLOBIN (HGB A1C): HbA1c, POC (controlled diabetic range): 6.2 % (ref 0.0–7.0)

## 2022-02-22 NOTE — Patient Instructions (Addendum)
It was great to see you today! Thank you for choosing [facility] for your primary care. Jim Hunter was seen for follow up.  Today we addressed: Continuing with your medications  We will obtain some labs as well  3. Please continue with seeing your GI doctor as well: [facility]  [phone] I will schedule you an appt with Dr. Valentina Lucks  Consider [phone] as a medication option  You can get the Shingrix vaccine at the pharmacy. You will need a 2nd shot 2-6 months after the first. This vaccine is important to help prevent shingles.   If you haven't already, sign up for My Chart to have easy access to your labs results, and communication with your primary care physician.  We are checking some labs today. If they are abnormal, I will call you. If they are normal, I will send you a MyChart message (if it is active) or a letter in the mail. If you do not hear about your labs in the next 2 weeks, please call the office.   You should return to our clinic Return in about 3 weeks (around 03/15/2022).  I recommend that you always bring your medications to each appointment as this makes it easy to ensure you are on the correct medications and helps [place] not miss refills when you need them.  Please arrive 15 minutes before your appointment to ensure smooth check in process.  We appreciate your efforts in making this happen.  Please call the clinic at [phone] if your symptoms worsen or you have any concerns.  Thank you for allowing me to participate in your care, Erskine Emery, MD 02/22/2022, 9:44 AM PGY-2, [place]

## 2022-02-22 NOTE — Assessment & Plan Note (Signed)
Currently only taking Zetia, will address medications and adherence at next visit in 3 weeks.

## 2022-02-22 NOTE — Assessment & Plan Note (Signed)
Patient with dyspnea that is likely multifactorial with OHS, OSA, aging. Discussed increasing exercise tolerance Discussed continuing with formal PFTs with Dr. Valentina Lucks, appointment made Will obtain updated CBC as well given history of anemia

## 2022-02-22 NOTE — Assessment & Plan Note (Signed)
Continue with amlodipine and HCTZ, controlled today.  Will update BMP today as well given CKD and slight AKI on last check.

## 2022-02-22 NOTE — Assessment & Plan Note (Signed)
Would likely benefit from GLP-1 given BMI of 41.  Will discuss at next visit in a couple of weeks.

## 2022-02-22 NOTE — Progress Notes (Signed)
  SUBJECTIVE:   CHIEF COMPLAINT / HPI:   Hypertension: BP: 126/81 today.  He endorses taking medications noted below as prescribed. Does not check blood pressure at home. Denies any CP, vision changes, new onset LE edema, medication SEs, or symptoms of hypotension. Exercise not currently.  Most recent creatinine trend:  Lab Results  Component Value Date   CREATININE 1.38 (H) 12/20/2021   CREATININE 1.49 (H) 12/06/2021   CREATININE 1.11 10/05/2021   Patient has had a BMP in the past 1 year.  Taking Amlodipine, Zetia, HCTZ now   Dyspnea:  Present for months  Cardiology r/o CHF with echo that was normal and coronary CT negative  Gained weight over last year  OSA, no CPAP currently  No history of asthma or inhaler usage  No COPD, smoker >20 years ago but stopped  Last chest XR 2021 normal    Had discectomy about one year ago    PERTINENT  PMH / PSH:   Past Medical History:  Diagnosis Date   Arthritis    GERD (gastroesophageal reflux disease)    Hx of adenomatous polyp of colon 02/03/2019   Hyperlipidemia    Hypertension    PPD screening test last 9 years ago first started as child   treated as child for positive ppd test   Sleep apnea 3 years ago   no borderline no cpap needed per sleep studty done    OBJECTIVE:  BP 126/81   Pulse 85   Ht '5\' 9"'$  (1.753 m)   Wt 277 lb 9.6 oz (125.9 kg)   SpO2 97%   BMI 40.99 kg/m   General: NAD, pleasant, able to participate in exam; obese male  Cardiac: RRR, no murmurs auscultated Respiratory: CTAB, normal WOB Abdomen: soft, non-tender, non-distended, normoactive bowel sounds Extremities: warm and well perfused, mild lower extremity edema  Skin: warm and dry, no rashes noted Psych: Normal affect and mood  ASSESSMENT/PLAN:  Hypertension Continue with amlodipine and HCTZ, controlled today.  Will update BMP today as well given CKD and slight AKI on last check.  Obesity (BMI 30-39.9) Would likely benefit from GLP-1 given BMI of  41.  Will discuss at next visit in a couple of weeks.  DOE (dyspnea on exertion) Patient with dyspnea that is likely multifactorial with OHS, OSA, aging. Discussed increasing exercise tolerance Discussed continuing with formal PFTs with Dr. Valentina Lucks, appointment made Will obtain updated CBC as well given history of anemia   Hyperlipidemia Currently only taking Zetia, will address medications and adherence at next visit in 3 weeks.   Orders Placed This Encounter  Procedures   Basic Metabolic Panel   CBC   Lipid panel   HgB A1c   No orders of the defined types were placed in this encounter.  Return in about 3 weeks (around 03/15/2022). Erskine Emery, MD 02/22/2022, 12:11 PM PGY-2, [place]

## 2022-02-23 LAB — LIPID PANEL
Chol/HDL Ratio: 6 ratio — ABNORMAL HIGH (ref 0.0–5.0)
Cholesterol, Total: 205 mg/dL — ABNORMAL HIGH (ref 100–199)
HDL: 34 mg/dL — ABNORMAL LOW (ref >39)
LDL Chol Calc (NIH): 140 mg/dL — ABNORMAL HIGH (ref 0–99)
Triglycerides: 173 mg/dL — ABNORMAL HIGH (ref 0–149)
VLDL Cholesterol Cal: 31 mg/dL (ref 5–40)

## 2022-02-23 LAB — BASIC METABOLIC PANEL
BUN/Creatinine Ratio: 17 (ref 9–20)
BUN: 19 mg/dL (ref 6–24)
CO2: 25 mmol/L (ref 20–29)
Calcium: 10.1 mg/dL (ref 8.7–10.2)
Chloride: 99 mmol/L (ref 96–106)
Creatinine, Ser: 1.11 mg/dL (ref 0.76–1.27)
Glucose: 97 mg/dL (ref 70–99)
Potassium: 4.1 mmol/L (ref 3.5–5.2)
Sodium: 140 mmol/L (ref 134–144)
eGFR: 78 mL/min/{1.73_m2} (ref >59)

## 2022-02-23 LAB — CBC
Hematocrit: 43.2 % (ref 37.5–51.0)
Hemoglobin: 14.3 g/dL (ref 13.0–17.7)
MCH: 28.9 pg (ref 26.6–33.0)
MCHC: 33.1 g/dL (ref 31.5–35.7)
MCV: 87 fL (ref 79–97)
Platelets: 263 10*3/uL (ref 150–450)
RBC: 4.94 x10E6/uL (ref 4.14–5.80)
RDW: 13.4 % (ref 11.6–15.4)
WBC: 5.9 10*3/uL (ref 3.4–10.8)

## 2022-02-27 ENCOUNTER — Ambulatory Visit (INDEPENDENT_AMBULATORY_CARE_PROVIDER_SITE_OTHER): Payer: No Typology Code available for payment source | Admitting: Pharmacist

## 2022-02-27 ENCOUNTER — Encounter: Payer: Self-pay | Admitting: Pharmacist

## 2022-02-27 DIAGNOSIS — R0609 Other forms of dyspnea: Secondary | ICD-10-CM | POA: Diagnosis not present

## 2022-02-27 NOTE — Assessment & Plan Note (Signed)
Patient has been experiencing shortness of breath for several years. Spirometry evaluation with pre-bronchodilator reveals slightly below average/normal lung function, with reduced FEV1 and FVC, possibly due to deconditioning. Patient is willing to work on weight loss/increasing physical activity. He is aware that he has gain > 30 lbs in the last 3 years.  - No current respiratory medication.   -no change in medications.   -Reviewed results of pulmonary function tests.  Pt verbalized understanding of results and education.   -Patient plans to focus effort on losing weight as key to improved breathing.

## 2022-02-27 NOTE — Progress Notes (Signed)
Reviewed: I agree with Dr. Koval's documentation and management. 

## 2022-02-27 NOTE — Patient Instructions (Signed)
Spirometry - slightly below normal most likely related to deconditioning.  Today we discussed impact of your weight on your breathing and need to focus effort on weight loss.  NO medication changes today.  Blood pressure is improved.   Follow-up with Dr. Zigmund Daniel 3-4 weeks.

## 2022-02-27 NOTE — Progress Notes (Signed)
   S:     Chief Complaint  Patient presents with   Medication Management    PFT - Spirometry   Jim Hunter is a 56 y.o. male who presents for lung function evaluation.  PMH is significant for HTN, hyperlipedmia.  Patient was referred and last seen by Primary Care Provider, Dr. Zigmund Daniel, on 02/22/2022.  Patient reports breathing has been declining in quality, getting winded easily over the last few years, getting worse more recently.   Medication adherence reported on all 3 BP medications. Stated he stopped taking losartan due to muscle aches but started taking it again 2 weeks ago and has tolerated without issue.  Uses Flonase sparingly, alternates taking omeprazole, pantoprazole or esomeprazole based on what the pharmacy gives him. Patient is only taking Lovaza once daily (2 tablets), says he forgets to take the second time. Patient exacerbation hx: none  O: Review of Systems  Respiratory:  Positive for shortness of breath.   All other systems reviewed and are negative.   Physical Exam Constitutional:      Appearance: He is obese.  Pulmonary:     Effort: Pulmonary effort is normal.  Neurological:     Mental Status: He is alert.  Psychiatric:        Mood and Affect: Mood normal.     Vitals:   02/27/22 1047  BP: 109/77  Pulse: 79  SpO2: 97%    See "scanned report" or Documentation Flowsheet (discrete results - PFTs) for Spirometry results. Patient provided good effort while attempting spirometry.   mmRC score: less than 2  Lung Age = 46    A/P: Patient has been experiencing shortness of breath for several years. Spirometry evaluation with pre-bronchodilator reveals slightly below average/normal lung function, with reduced FEV1 and FVC, possibly due to deconditioning. Patient is willing to work on weight loss/increasing physical activity. He is aware that he has gain > 30 lbs in the last 3 years.  - No current respiratory medication.   -no change in medications.    -Reviewed results of pulmonary function tests.  Pt verbalized understanding of results and education.   -Patient plans to focus effort on losing weight as key to improved breathing.   Written patient instructions provided.   Total time in face to face counseling 30 minutes.    Follow-up:  Pharmacist Nicholas Lose. PCP clinic visit 3-4 weeks  Patient seen with Martina Sinner, PharmD Candidate.

## 2022-02-28 ENCOUNTER — Encounter: Payer: Self-pay | Admitting: Student

## 2022-03-10 ENCOUNTER — Ambulatory Visit (INDEPENDENT_AMBULATORY_CARE_PROVIDER_SITE_OTHER): Payer: No Typology Code available for payment source | Admitting: Student

## 2022-03-10 VITALS — BP 124/68 | HR 86 | Temp 98.2°F | Ht 69.0 in | Wt 269.0 lb

## 2022-03-10 DIAGNOSIS — B349 Viral infection, unspecified: Secondary | ICD-10-CM | POA: Diagnosis not present

## 2022-03-10 NOTE — Progress Notes (Signed)
  SUBJECTIVE:   CHIEF COMPLAINT / HPI:   Sinus pressure: Patient presents with concerns for flulike symptoms that began last weekend when he went to the beach.  Notes that he began with sinus pressure and subjective fever and chills.  Endorses cough with mildly productive sputum.  Denies nausea/vomiting/diarrhea.  Has only used Advil which is helped some.  PERTINENT  PMH / PSH: Obesity, sleep apnea HTN, HLD  OBJECTIVE:  BP 124/68   Pulse 86   Temp 98.2 F (36.8 C) (Oral)   Ht '5\' 9"'$  (1.753 m)   Wt 269 lb (122 kg)   SpO2 99%   BMI 39.72 kg/m   General: NAD, pleasant, able to participate in exam HEENT: MMM, TMs normal, without appreciable cervical lymphadenopathy Cardiac: RRR, no murmurs auscultated Respiratory: CTAB, normal WOB Psych: Normal affect and mood  ASSESSMENT/PLAN:  Viral illness Assessment & Plan: Suspect nonspecific viral URI, history and PE not consistent with bacterial infection. Flu/COVID tested for employee health purposes.  May return to work if afebrile x24 hours.  Orders: -     Coronavirus ([id number]) with Influenza A and Influenza B   Return if symptoms worsen or fail to improve. Wells Guiles, DO 03/10/2022, 9:19 AM PGY-2, [place]

## 2022-03-10 NOTE — Patient Instructions (Addendum)
It was great to see you today! Thank you for choosing [facility] for your primary care. Jim Hunter was seen for flulike symptoms.  Today we addressed: I suspect you had a viral illness.  Given that you improving and have not had a true fever, you are safe to return to work.  We have flu/COVID tested you either way but this may take 1 to 2 days to come back.  Should you develop a true fever of greater than 100.4, please do not return to work for 24 hours until you are without fever.  If you haven't already, sign up for My Chart to have easy access to your labs results, and communication with your primary care physician.  Call the clinic at [phone] if your symptoms worsen or you have any concerns.  You should return to our clinic Return if symptoms worsen or fail to improve. Please arrive 15 minutes before your appointment to ensure smooth check in process.  We appreciate your efforts in making this happen.  Thank you for allowing me to participate in your care, Wells Guiles, DO 03/10/2022, 9:14 AM PGY-2, [place]

## 2022-03-10 NOTE — Assessment & Plan Note (Addendum)
Suspect nonspecific viral URI, history and PE not consistent with bacterial infection. Flu/COVID tested for employee health purposes.  May return to work if afebrile x24 hours.

## 2022-03-15 LAB — COVID-19, FLU A+B NAA
Influenza A, NAA: NOT DETECTED
Influenza B, NAA: NOT DETECTED
SARS-CoV-2, NAA: DETECTED — AB

## 2022-03-24 ENCOUNTER — Other Ambulatory Visit (HOSPITAL_COMMUNITY): Payer: Self-pay

## 2022-03-26 ENCOUNTER — Telehealth: Payer: Self-pay | Admitting: Student

## 2022-03-26 NOTE — Telephone Encounter (Signed)
For next appt, per pharmacy:   This patient is indicated for the addition of an SGLT2i for renal protection in CKD (most recent GFR ~ 60).   Farxiga (dapagliflozin) '10mg'$  daily would be great as we have a $0 copay card available. There are additional copay cards in [facility]'s office (next door to [employer]).   Erskine Emery, MD

## 2022-03-27 NOTE — Progress Notes (Unsigned)
Cardiology Office Note:    Date:  03/28/2022   ID:  Jim Hunter, DOB March 15, 1966, MRN [id number]  PCP:  Erskine Emery, MD   [facility] Providers Cardiologist:  Lenna Sciara, MD Referring MD: Erskine Emery, MD   Chief Complaint/Reason for Referral: Dyspnea on exertion  ASSESSMENT:    Dyspnea, unspecified type  Hyperlipidemia, unspecified hyperlipidemia type  BMI 40.0-44.9, adult ([place])  Primary hypertension    PLAN:    In order of problems listed above:  1.  Dyspnea: His dyspnea is likely due to his body habitus and 40 pound weight gain.  I discussed with him that he is a good candidate for Ozempic or [id number].  He will discuss this with his PCP.  Additionally I think he should get a sleep apnea evaluation.  He will discuss this with his PCP as well.  We will keep follow-up open-ended.  2.  Hyperlipidemia: This is being managed by the patient's primary care provider..  3.  Elevated BMI: He did meet with pharmacy regarding [facility] but he elected not to pursue this.  I discussed with him that he is a good candidate for Ozempic or [id number].  He will discuss with his PCP.  4.  Hypertension: Repeat blood pressure was 110/61mHg.  Continue current medical therapy.              Dispo:  Return if symptoms worsen or fail to improve.     Medication Adjustments/Labs and Tests Ordered: Current medicines are reviewed at length with the patient today.  Concerns regarding medicines are outlined above.   Tests Ordered: No orders of the defined types were placed in this encounter.   Medication Changes: No orders of the defined types were placed in this encounter.   History of Present Illness:    FOCUSED PROBLEM LIST: 1.  Hyperlipidemia; intolerant of Crestor, Lipitor, and fenofibrate 2.  Hypertension 3.  GERD 4.  BMI of 44  January 2023: Patient was seen for initial consultation regarding dyspnea.  He had gained approximately 40 pounds.  He was referred for coronary  CTA which was very reassuring.  He was also referred to pharmacy given his statin intolerances.  He was started on Zetia and fenofibrate due to triglycerides of over 150.  He has been seen by pharmacy multiple times and at his last visit recently expressed some frustration with ongoing shortness of breath.  Was relayed to him that his cardiac evaluation was relatively negative and his shortness of breath was likely noncardiac in etiology.  Today: The patient was seen by pharmacy recently.  He had stopped a lot of his medications due to frustration with ongoing shortness of breath.  He was advised to restart his amlodipine and hydrochlorothiazide in an incremental fashion.  Due to intolerance of fenofibrate restart Lovaza.  He still remains short of breath.  I reviewed with him and the fact that his cardiac evaluation has been negative.  He tells me that once he switched to third shift he gained 40 pounds.  He tells me he gets short of breath with minimal exertion.  He is not short of breath at rest.  He denies any exertional angina.  He has had no presyncope or syncope.  He is required no emergency room visits or hospitalizations.   Current Medications: Current Meds  Medication Sig   amLODipine (NORVASC) 10 MG tablet Take 1 tablet (10 mg total) by mouth at bedtime.   esomeprazole (NEXIUM) 40 MG capsule Take 1 capsule (40 mg total)  by mouth daily.   ezetimibe (ZETIA) 10 MG tablet Take 1 tablet (10 mg total) by mouth daily.   fluticasone (FLONASE) 50 MCG/ACT nasal spray Place 2 sprays into both nostrils daily.   hydrochlorothiazide (HYDRODIURIL) 25 MG tablet Take 1 tablet (25 mg total) by mouth daily.   Ibuprofen 200 MG CAPS Take 400-600 mg by mouth every 6 (six) hours as needed (pain).   omega-3 acid ethyl esters (LOVAZA) 1 g capsule Take 2 capsules (2 g total) by mouth 2 (two) times daily.   sildenafil (VIAGRA) 100 MG tablet Take 1 tablet (100 mg total) by mouth daily as needed.   [DISCONTINUED]  lisinopril-hydrochlorothiazide (ZESTORETIC) 20-12.5 MG tablet TAKE 1 TABLET BY MOUTH DAILY.   [DISCONTINUED] losartan (COZAAR) 25 MG tablet Take 1 tablet (25 mg total) by mouth at bedtime.   [DISCONTINUED] omeprazole (PRILOSEC) 20 MG capsule TAKE 1 CAPSULE BY MOUTH ONCE DAILY     Allergies:    Crestor [rosuvastatin], Fenofibrate, Lipitor [atorvastatin calcium], Valsartan, and Lisinopril   Social History:   Social History   Tobacco Use   Smoking status: Former    Packs/day: 1.00    Years: 10.00    Total pack years: 10.00    Types: Cigarettes   Smokeless tobacco: Never  Vaping Use   Vaping Use: Never used  Substance Use Topics   Alcohol use: Yes    Comment: Ocasional    Drug use: No     Family Hx: Family History  Problem Relation Age of Onset   Hypertension Mother    Hypertension Brother    Hypertension Maternal Grandmother    Hypertension Sister    Colon cancer Neg Hx    Colon polyps Neg Hx    Esophageal cancer Neg Hx    Rectal cancer Neg Hx    Stomach cancer Neg Hx    Diabetes Neg Hx    Kidney disease Neg Hx    Liver disease Neg Hx    Prostate cancer Neg Hx      Review of Systems:   Please see the history of present illness.    All other systems reviewed and are negative.     EKGs/Labs/Other Test Reviewed:    EKG:  EKG today: Sinus tachycardia; prior EKG: From 2016 demonstrates sinus rhythm  Prior CV studies:  Coronary CTA: 1. Calcium score 0 2.  Normal right dominant coronary arteries 3.  Mildly dilated ascending thoracic aorta 3.9 cm  Echocardiogram 2021 1. Left ventricular ejection fraction, by estimation, is 55 to 60%. The  left ventricle has normal function. The left ventricle has no regional  wall motion abnormalities. There is mild left ventricular hypertrophy.  Left ventricular diastolic parameters  were normal.   2. Right ventricular systolic function is normal. The right ventricular  size is normal.   3. The mitral valve is normal in  structure. Trivial mitral valve  regurgitation. No evidence of mitral stenosis.   4. The aortic valve is tricuspid. Aortic valve regurgitation is not  visualized. Mild aortic valve sclerosis is present, with no evidence of  aortic valve stenosis.   5. Aortic dilatation noted. There is mild dilatation of the aortic root,  measuring 38 mm.   6. The inferior vena cava is normal in size with greater than 50%  respiratory variability, suggesting right atrial pressure of 3 mmHg.   Imaging studies that I have independently reviewed today: Echocardiogram  Recent Labs: 07/06/2021: TSH 2.740 10/05/2021: ALT 54 02/22/2022: BUN 19; Creatinine, Ser 1.11; Hemoglobin  14.3; Platelets 263; Potassium 4.1; Sodium 140   Recent Lipid Panel Lab Results  Component Value Date/Time   CHOL 205 (H) 02/22/2022 10:23 AM   TRIG 173 (H) 02/22/2022 10:23 AM   HDL 34 (L) 02/22/2022 10:23 AM   LDLCALC 140 (H) 02/22/2022 10:23 AM    Risk Assessment/Calculations:          Physical Exam:    VS:  BP 110/80   Pulse 75   Ht '5\' 9"'$  (1.753 m)   Wt 273 lb (123.8 kg)   SpO2 98%   BMI 40.32 kg/m    Wt Readings from Last 3 Encounters:  03/28/22 273 lb (123.8 kg)  03/10/22 269 lb (122 kg)  02/27/22 279 lb (126.6 kg)    GENERAL:  No apparent distress, AOx3 HEENT:  No carotid bruits, +2 carotid impulses, no scleral icterus CAR: RRR no murmurs, gallops, rubs, or thrills RES:  Clear to auscultation bilaterally ABD:  Soft, nontender, nondistended, positive bowel sounds x 4 VASC:  +2 radial pulses, +2 carotid pulses, palpable pedal pulses NEURO:  CN 2-12 grossly intact; motor and sensory grossly intact PSYCH:  No active depression or anxiety EXT:  No edema, ecchymosis, or cyanosis  Signed, Early Osmond, MD  03/28/2022 9:25 AM    [address], [place], [place]  [address] Phone: [phone]; Fax: [phone]   Note:  This document was prepared using Dragon voice  recognition software and may include unintentional dictation errors.

## 2022-03-28 ENCOUNTER — Ambulatory Visit: Payer: No Typology Code available for payment source | Attending: Internal Medicine | Admitting: Internal Medicine

## 2022-03-28 ENCOUNTER — Other Ambulatory Visit (HOSPITAL_COMMUNITY): Payer: Self-pay

## 2022-03-28 ENCOUNTER — Encounter: Payer: Self-pay | Admitting: Internal Medicine

## 2022-03-28 VITALS — BP 110/80 | HR 75 | Ht 69.0 in | Wt 273.0 lb

## 2022-03-28 DIAGNOSIS — Z6841 Body Mass Index (BMI) 40.0 and over, adult: Secondary | ICD-10-CM

## 2022-03-28 DIAGNOSIS — E785 Hyperlipidemia, unspecified: Secondary | ICD-10-CM

## 2022-03-28 DIAGNOSIS — I10 Essential (primary) hypertension: Secondary | ICD-10-CM | POA: Diagnosis not present

## 2022-03-28 DIAGNOSIS — R0600 Dyspnea, unspecified: Secondary | ICD-10-CM | POA: Diagnosis not present

## 2022-03-28 NOTE — Patient Instructions (Signed)
Medication Instructions:  Instcur  *If you need a refill on your cardiac medications before your next appointment, please call your pharmacy*   Lab Work: NONE If you have labs (blood work) drawn today and your tests are completely normal, you will receive your results only by: [place] (if you have MyChart) OR A paper copy in the mail If you have any lab test that is abnormal or we need to change your treatment, we will call you to review the results.   Testing/Procedures: NONE   Follow-Up: At [facility], you and your health needs are our priority.  As part of our continuing mission to provide you with exceptional heart care, we have created designated Provider Care Teams.  These Care Teams include your primary Cardiologist (physician) and Advanced Practice Providers (APPs -  Physician Assistants and Nurse Practitioners) who all work together to provide you with the care you need, when you need it.  Your next appointment:   As Needed  The format for your next appointment:   In Person  Provider:   Lenna Sciara, MD    Important Information About Sugar

## 2022-04-08 ENCOUNTER — Other Ambulatory Visit (HOSPITAL_COMMUNITY): Payer: Self-pay

## 2022-04-10 ENCOUNTER — Other Ambulatory Visit (HOSPITAL_COMMUNITY): Payer: Self-pay

## 2022-04-13 ENCOUNTER — Other Ambulatory Visit (HOSPITAL_COMMUNITY): Payer: Self-pay

## 2022-04-18 ENCOUNTER — Other Ambulatory Visit (HOSPITAL_COMMUNITY): Payer: Self-pay

## 2022-05-10 ENCOUNTER — Encounter: Payer: Self-pay | Admitting: Student

## 2022-05-10 ENCOUNTER — Ambulatory Visit (INDEPENDENT_AMBULATORY_CARE_PROVIDER_SITE_OTHER): Payer: No Typology Code available for payment source | Admitting: Student

## 2022-05-10 ENCOUNTER — Other Ambulatory Visit (HOSPITAL_COMMUNITY): Payer: Self-pay

## 2022-05-10 VITALS — BP 134/74 | HR 84 | Ht 69.0 in | Wt 274.0 lb

## 2022-05-10 DIAGNOSIS — E669 Obesity, unspecified: Secondary | ICD-10-CM | POA: Diagnosis not present

## 2022-05-10 DIAGNOSIS — E785 Hyperlipidemia, unspecified: Secondary | ICD-10-CM

## 2022-05-10 DIAGNOSIS — G4733 Obstructive sleep apnea (adult) (pediatric): Secondary | ICD-10-CM | POA: Diagnosis not present

## 2022-05-10 DIAGNOSIS — R0609 Other forms of dyspnea: Secondary | ICD-10-CM

## 2022-05-10 DIAGNOSIS — K429 Umbilical hernia without obstruction or gangrene: Secondary | ICD-10-CM

## 2022-05-10 DIAGNOSIS — I10 Essential (primary) hypertension: Secondary | ICD-10-CM | POA: Diagnosis not present

## 2022-05-10 MED ORDER — WEGOVY 0.25 MG/0.5ML ~~LOC~~ SOAJ
0.2500 mg | SUBCUTANEOUS | 0 refills | Status: DC
  Filled 2022-05-10 – 2023-03-02 (×5): qty 2, 28d supply, fill #0

## 2022-05-10 NOTE — Assessment & Plan Note (Addendum)
BP: 134/74 today. Goal of <140/90. Continue to work on healthy dietary habits and exercise. Follow up.   Medication regimen: HCTZ 25 mg daily, Amlodipine 10 mg

## 2022-05-10 NOTE — Patient Instructions (Addendum)
It was great to see you today! Thank you for choosing [facility] for your primary care. Jim Hunter was seen for follow up.  Today we addressed: Continuing with a medication called a GLP1 for weight loss, I have ordered wegovy to [place]  Try to work on weight loss  Come back in for a fasting lipid panel  I will check your thyroid today as well   If you haven't already, sign up for My Chart to have easy access to your labs results, and communication with your primary care physician.  I recommend that you always bring your medications to each appointment as this makes it easy to ensure you are on the correct medications and helps [place] not miss refills when you need them. Call the clinic at [phone] if your symptoms worsen or you have any concerns.  You should return to our clinic Return in about 4 weeks (around 06/07/2022) for Weight loss, nutrition, HTN . Please arrive 15 minutes before your appointment to ensure smooth check in process.  We appreciate your efforts in making this happen.  Thank you for allowing me to participate in your care, Erskine Emery, MD 05/10/2022, 9:17 AM PGY-2, [place]

## 2022-05-10 NOTE — Progress Notes (Unsigned)
SUBJECTIVE:   CHIEF COMPLAINT / HPI:   Obesity:  Patient complains of obesity. Patient cites health as reasons for wanting to lose weight.  Obesity History Switched to third shift at work. Period of greatest weight gain: 30 lb during late adult years Highest adult weight: 283 lb, at 274 today    History of Weight Loss Efforts Circumstances associated with regain of weight: Changing shifts Unsuccessful weight loss techniques attempted: very low calorie diet  Current Exercise Habits none  Current Eating Habits Number of regular meals per day: 2 Number of snacking episodes per day: a few- sometimes  Who shops for food? patient Binge behavior?: no Purge behavior? no Anorexic behavior? no Eating precipitated by stress? no Guilt feelings associated with eating? no  Other Potential Contributing Factors Use of alcohol: average 1 quart of beer a day  Use of medications that may cause weight gain none Psych History:  None Comorbidities: GERD, hypertension, and sleep apnea/hypopnea  Dyspnea:  History of OSA, no CPAP used in past  Had a couple of sleep studies done, last one 5 years ago. However, he notes that he has never received the results of these  He does not want another one.    PERTINENT  PMH / PSH: GERD, prediabetes, HTN, HLD  OBJECTIVE:  BP 134/74   Pulse 84   Ht '5\' 9"'$  (1.753 m)   Wt 274 lb (124.3 kg)   SpO2 100%   BMI 40.46 kg/m  Physical Exam Vitals reviewed.  Constitutional:      Appearance: Normal appearance.  HENT:     Head: Normocephalic and atraumatic.     Nose: Nose normal.     Mouth/Throat:     Mouth: Mucous membranes are moist.  Eyes:     Pupils: Pupils are equal, round, and reactive to light.  Cardiovascular:     Rate and Rhythm: Normal rate.     Pulses: Normal pulses.     Heart sounds: Normal heart sounds.  Pulmonary:     Effort: Pulmonary effort is normal. No respiratory distress.     Breath sounds: Normal breath sounds. No stridor.   Abdominal:     General: There is no distension.     Palpations: Abdomen is soft. There is no mass.     Hernia: A hernia (reducible umbilical hernia) is present.  Musculoskeletal:     Cervical back: Normal range of motion.  Skin:    General: Skin is warm.     Capillary Refill: Capillary refill takes less than 2 seconds.  Neurological:     General: No focal deficit present.     Mental Status: He is alert.  Psychiatric:        Mood and Affect: Mood normal.        Behavior: Behavior normal.      ASSESSMENT/PLAN:  Hyperlipidemia, unspecified hyperlipidemia type Assessment & Plan: I need a fasting lipid panel to assess need for medication change. Currently taking Ezetimibe and Lovaza. Future order placed   Orders: -     Wegovy; Inject 0.25 mg into the skin once a week. Continue for four weeks and then increase dosage  Dispense: 2 mL; Refill: 0 -     Lipid panel; Future  Primary hypertension Assessment & Plan: BP: 134/74 today. Goal of <140/90. Continue to work on healthy dietary habits and exercise. Follow up.   Medication regimen: HCTZ 25 mg daily, Amlodipine 10 mg     Orders: -     JJOACZ; Inject 0.25 mg  into the skin once a week. Continue for four weeks and then increase dosage  Dispense: 2 mL; Refill: 0  OSA (obstructive sleep apnea) Assessment & Plan: Likely with OHS, repeat sleep study needed. Patient declines this at this time, will readdress.   Orders: -     Wegovy; Inject 0.25 mg into the skin once a week. Continue for four weeks and then increase dosage  Dispense: 2 mL; Refill: 0  Obesity (BMI 30-39.9) Assessment & Plan: Obese with multiple comorbidities, candidate for GLP1. Discussed with patient and decided to continue with Wegovy to assist with weight loss and OHS/OSA.     Orders: -     Wegovy; Inject 0.25 mg into the skin once a week. Continue for four weeks and then increase dosage  Dispense: 2 mL; Refill: 0 -     TSH Rfx on Abnormal to Free  T4  Umbilical hernia without obstruction and without gangrene Assessment & Plan: Reducible on exam   DOE (dyspnea on exertion) Assessment & Plan: Ddx evaluated: Does not appear cardiac related in nature, patient is a non-smoker and received PFTs with Dr. Valentina Lucks that showed likely increased dyspnea from increasing weight gain and deconditioning.  We will continue with [facility] for weight loss for assistance.  He has agreed to work on weight loss and gradually work on deconditioning. If he follows our instructions and loses weight without improvement of shortness of breath, we will plan to possibly refer to pulmonology.     Return in about 4 weeks (around 06/07/2022) for Weight loss, nutrition, HTN . Erskine Emery, MD 05/11/2022, 2:34 PM PGY-2, [place]

## 2022-05-10 NOTE — Assessment & Plan Note (Addendum)
Likely with OHS, repeat sleep study needed. Patient declines this at this time, will readdress.

## 2022-05-11 ENCOUNTER — Other Ambulatory Visit (HOSPITAL_COMMUNITY): Payer: Self-pay

## 2022-05-11 LAB — TSH RFX ON ABNORMAL TO FREE T4: TSH: 2.74 u[IU]/mL (ref 0.450–4.500)

## 2022-05-11 NOTE — Assessment & Plan Note (Signed)
I need a fasting lipid panel to assess need for medication change. Currently taking Ezetimibe and Lovaza. Future order placed

## 2022-05-11 NOTE — Assessment & Plan Note (Addendum)
Ddx evaluated: Does not appear cardiac related in nature, patient is a non-smoker and received PFTs with Dr. Valentina Lucks that showed likely increased dyspnea from increasing weight gain and deconditioning.  We will continue with [facility] for weight loss for assistance.  He has agreed to work on weight loss and gradually work on deconditioning. If he follows our instructions and loses weight without improvement of shortness of breath, we will plan to possibly refer to pulmonology.

## 2022-05-11 NOTE — Assessment & Plan Note (Signed)
Reducible on exam

## 2022-05-11 NOTE — Assessment & Plan Note (Signed)
Obese with multiple comorbidities, candidate for GLP1. Discussed with patient and decided to continue with Wegovy to assist with weight loss and OHS/OSA.

## 2022-05-12 ENCOUNTER — Telehealth: Payer: Self-pay

## 2022-05-12 NOTE — Telephone Encounter (Signed)
**Note De-Identified Jim Hunter Obfuscation** Lovaza PA started through covermymeds. Key: [id number]

## 2022-05-15 ENCOUNTER — Other Ambulatory Visit (HOSPITAL_COMMUNITY): Payer: Self-pay

## 2022-05-15 ENCOUNTER — Telehealth: Payer: Self-pay

## 2022-05-15 NOTE — Telephone Encounter (Signed)
Pharmacy Patient Advocate Encounter   Received notification from [employer]  that prior authorization for [facility] 0.'25MG'$ /0.5ML. is required/requested. :    PA submitted on 05/15/2022 via CoverMyMeds Key [id number]  Status is pending    [place] [facility] Patient Advocate

## 2022-05-16 NOTE — Telephone Encounter (Signed)
  Received a fax from [place] regarding Prior Authorization for Wegovy 0.'25MG'$ /0.5ML auto-injectors.   Authorization has been DENIED due to Your provider requested Wegovy pens for weight loss. When used for weight loss or weight management, our guideline named ANTI-OBESITY AGENTS (reviewed for [facility]) requires that you are actively enrolled in an exercise and caloric reduction program or a weight loss/behavioral modification program.   Selinda Orion CPhT Rx Patient Advocate

## 2022-05-17 ENCOUNTER — Other Ambulatory Visit (HOSPITAL_COMMUNITY): Payer: Self-pay

## 2022-05-19 NOTE — Telephone Encounter (Signed)
A Prior Authorization APPEAL was initiated for this patients WEGOVY through CoverMyMeds.   Key: [id number]

## 2022-05-19 NOTE — Telephone Encounter (Signed)
Omega 3 ethyl esters 1 gm cap was approved from 05/12/22-05/12/23 Reference # [id number], plan code [id number]

## 2022-05-22 ENCOUNTER — Other Ambulatory Visit (HOSPITAL_COMMUNITY): Payer: Self-pay

## 2022-05-23 ENCOUNTER — Other Ambulatory Visit: Payer: Self-pay

## 2022-05-24 ENCOUNTER — Other Ambulatory Visit (HOSPITAL_COMMUNITY): Payer: Self-pay

## 2022-05-25 ENCOUNTER — Other Ambulatory Visit (HOSPITAL_COMMUNITY): Payer: Self-pay

## 2022-05-29 ENCOUNTER — Other Ambulatory Visit (HOSPITAL_COMMUNITY): Payer: Self-pay

## 2022-05-30 ENCOUNTER — Other Ambulatory Visit (HOSPITAL_COMMUNITY): Payer: Self-pay

## 2022-05-30 NOTE — Telephone Encounter (Signed)
Fax from [employer]:  05/29/22 Appeal rec'd. [employer] will notify us by mail of the determination no later than 15 days from the receipt of the appeal.

## 2022-05-31 NOTE — Telephone Encounter (Signed)
Prior Auth APPEAL for patients medication WEGOVY denied by [facility] via CoverMyMeds.   Reason:

## 2022-06-02 ENCOUNTER — Other Ambulatory Visit (HOSPITAL_COMMUNITY): Payer: Self-pay

## 2022-06-06 ENCOUNTER — Encounter: Payer: Self-pay | Admitting: Student

## 2022-06-12 ENCOUNTER — Other Ambulatory Visit (HOSPITAL_COMMUNITY): Payer: Self-pay

## 2022-06-24 IMAGING — CT CT HEART MORP W/ CTA COR W/ SCORE W/ CA W/CM &/OR W/O CM
4 of 7 series · 8 of 20 positions shown, 9 images · IV contrast (APPLIED)
Comparison: None.

Addendum:
CLINICAL DATA: Chest pain

EXAM:
Cardiac CTA
MEDICATIONS:
Sub lingual nitro. 4mg and lopressor 100mg
TECHNIQUE: The patient was scanned on a Siemens Force [REDACTED]ice scanner. Gantry
rotation speed was 250 msecs. Collimation was .6 mm. A 100 kV
prospective scan was triggered in the ascending thoracic aorta at
140 HU's Full mA was used between 35% and 75% of the R-R interval.
Average HR during the scan was 62 bpm. The 3D data set was
interpreted on a dedicated work station using MPR, MIP and VRT
modes. A total of 80 cc of contrast was used.

[Series 6: ts diast sharp · axial · 0.40mm/px · z∈[-222,-188]mm · 2 of 256 slices shown]
[im 86/256  lung]
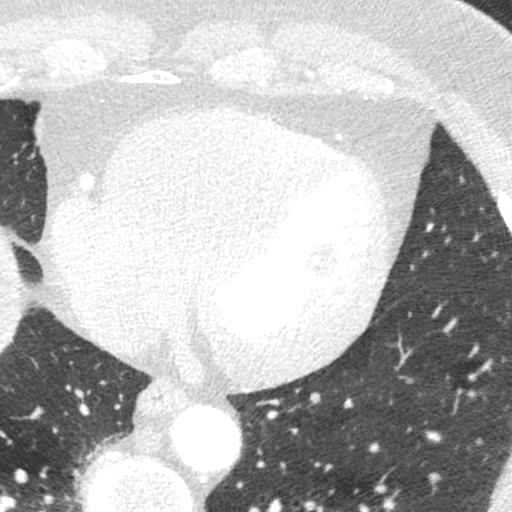
[im 171/256  lung]
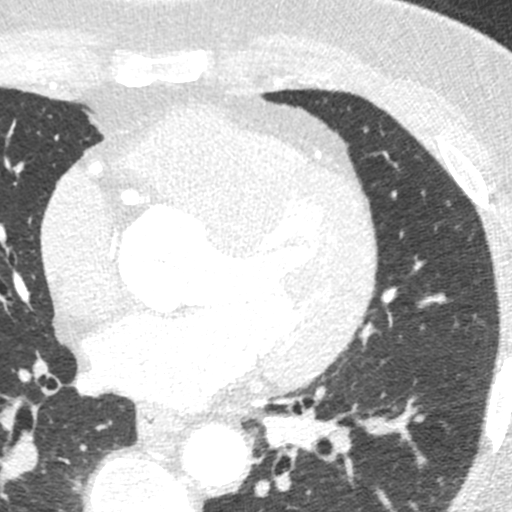

[Series 7: ts syst sharp · axial · 0.40mm/px · z∈[-222,-188]mm · 2 of 256 slices shown]
[im 86/256  lung]
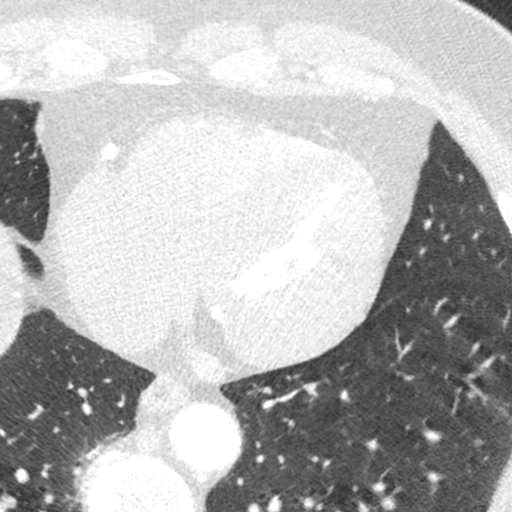
[im 171/256  lung]
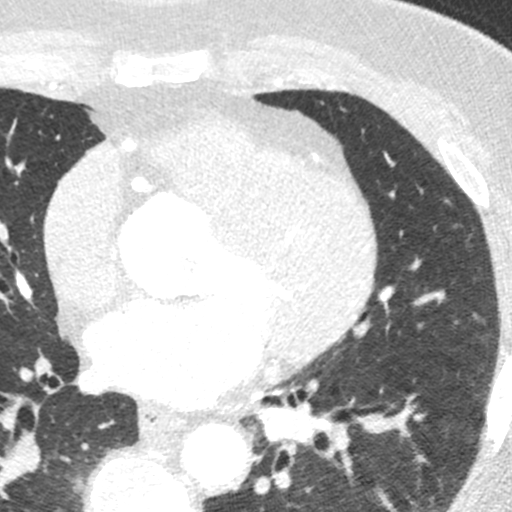

[Series 8: best diast · axial · 0.40mm/px · z∈[-222,-188]mm · 2 of 256 slices shown, 3 images]
[im 86/256  vessel]
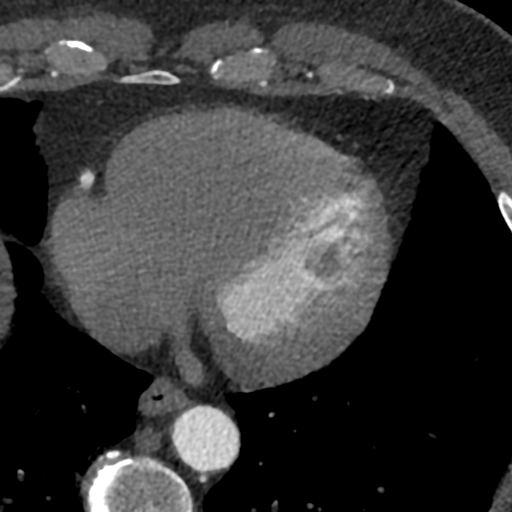
[im 86/256  lung]
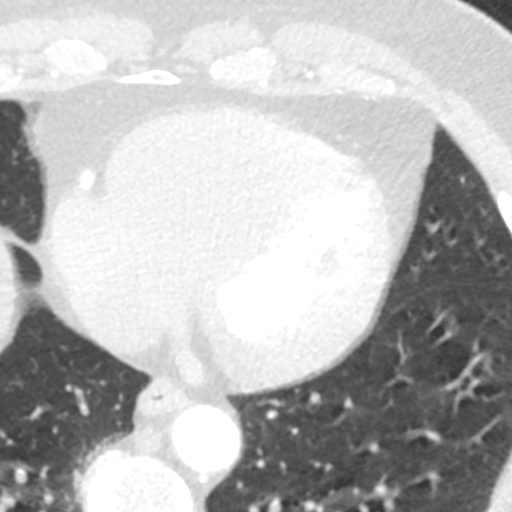
[im 171/256  vessel]
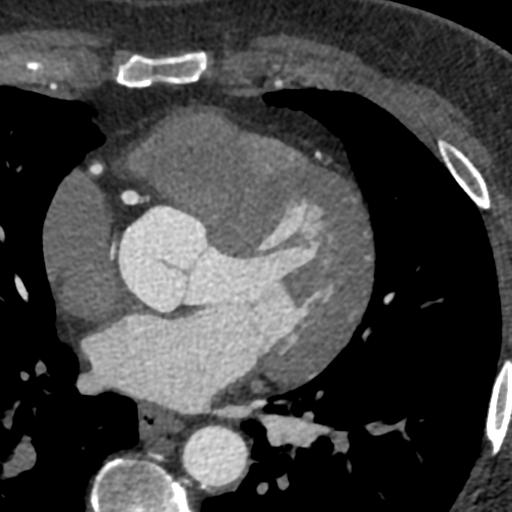

[Series 9: best syst · axial · 0.40mm/px · z∈[-222,-188]mm · 2 of 256 slices shown]
[im 86/256  vessel]
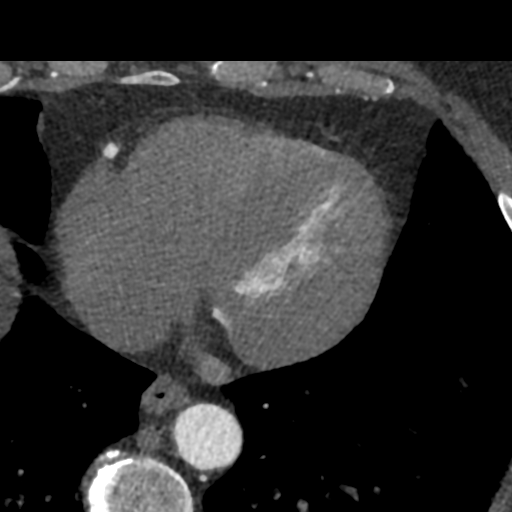
[im 171/256  vessel]
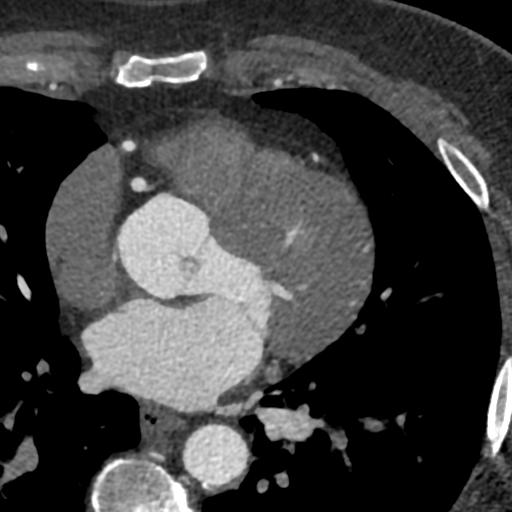

[8 of 20 positions shown; findings below may reference images not displayed]

FINDINGS: Non-cardiac: See separate report from [REDACTED]. No
significant findings on limited lung and soft tissue windows.

Calcium Score: No calcium noted

Coronary Arteries: Right dominant with no anomalies

LM: Normal

LAD: Normal

D1: Normal

D2: Normal

D3: Normal

Circumflex: Normal

OM1: Normal

OM2: Normal

RCA: Normal

PDA: Normal

PLA: Normal
IMPRESSION: 1. Calcium score 0

2.  Normal right dominant coronary arteries

3.  Mildly dilated ascending thoracic aorta 3.9 cm

Precious Presh Athalia

EXAM:
OVER-READ INTERPRETATION  CT CHEST

The following report is an over-read performed by radiologist Dr.
over-read does not include interpretation of cardiac or coronary
anatomy or pathology. The coronary calcium score interpretation by
the cardiologist is attached.
FINDINGS: No significant noncardiac vascular findings. Visualized mediastinum
and hilar regions demonstrate no lymphadenopathy or masses.
Visualized lungs show no evidence of pulmonary edema, consolidation,
pneumothorax, nodule or pleural fluid. Visualized upper abdomen and
bony structures are unremarkable.
IMPRESSION: No significant incidental findings.

*** End of Addendum ***
FINDINGS: Non-cardiac: See separate report from [REDACTED]. No
significant findings on limited lung and soft tissue windows.

Calcium Score: No calcium noted

Coronary Arteries: Right dominant with no anomalies

LM: Normal

LAD: Normal

D1: Normal

D2: Normal

D3: Normal

Circumflex: Normal

OM1: Normal

OM2: Normal

RCA: Normal

PDA: Normal

PLA: Normal
IMPRESSION: 1. Calcium score 0

2.  Normal right dominant coronary arteries

3.  Mildly dilated ascending thoracic aorta 3.9 cm

Precious Presh Athalia

## 2022-06-30 ENCOUNTER — Other Ambulatory Visit (HOSPITAL_COMMUNITY): Payer: Self-pay

## 2022-07-04 ENCOUNTER — Other Ambulatory Visit (HOSPITAL_COMMUNITY): Payer: Self-pay

## 2022-07-28 ENCOUNTER — Other Ambulatory Visit: Payer: Self-pay | Admitting: Family Medicine

## 2022-07-28 ENCOUNTER — Other Ambulatory Visit (HOSPITAL_COMMUNITY): Payer: Self-pay

## 2022-07-28 DIAGNOSIS — K219 Gastro-esophageal reflux disease without esophagitis: Secondary | ICD-10-CM

## 2022-07-28 MED ORDER — ESOMEPRAZOLE MAGNESIUM 40 MG PO CPDR
40.0000 mg | DELAYED_RELEASE_CAPSULE | Freq: Every day | ORAL | 2 refills | Status: DC
  Filled 2022-07-28: qty 90, 90d supply, fill #0
  Filled 2022-12-12 – 2023-01-04 (×2): qty 90, 90d supply, fill #1
  Filled 2023-07-01: qty 90, 90d supply, fill #2

## 2022-11-21 ENCOUNTER — Other Ambulatory Visit (HOSPITAL_COMMUNITY): Payer: Self-pay

## 2022-12-12 ENCOUNTER — Other Ambulatory Visit (HOSPITAL_COMMUNITY): Payer: Self-pay

## 2022-12-12 ENCOUNTER — Other Ambulatory Visit: Payer: Self-pay

## 2022-12-12 ENCOUNTER — Other Ambulatory Visit: Payer: Self-pay | Admitting: Internal Medicine

## 2022-12-13 ENCOUNTER — Other Ambulatory Visit: Payer: Self-pay

## 2022-12-13 ENCOUNTER — Other Ambulatory Visit (HOSPITAL_COMMUNITY): Payer: Self-pay

## 2022-12-14 ENCOUNTER — Other Ambulatory Visit (HOSPITAL_COMMUNITY): Payer: Self-pay

## 2022-12-14 MED ORDER — EZETIMIBE 10 MG PO TABS
10.0000 mg | ORAL_TABLET | Freq: Every day | ORAL | 0 refills | Status: DC
  Filled 2022-12-14 – 2023-02-04 (×2): qty 90, 90d supply, fill #0

## 2022-12-14 MED ORDER — HYDROCHLOROTHIAZIDE 25 MG PO TABS
25.0000 mg | ORAL_TABLET | Freq: Every day | ORAL | 0 refills | Status: DC
  Filled 2022-12-14 – 2023-02-04 (×2): qty 90, 90d supply, fill #0

## 2022-12-15 ENCOUNTER — Other Ambulatory Visit: Payer: Self-pay

## 2022-12-25 ENCOUNTER — Other Ambulatory Visit (HOSPITAL_COMMUNITY): Payer: Self-pay

## 2023-01-04 ENCOUNTER — Other Ambulatory Visit (HOSPITAL_COMMUNITY): Payer: Self-pay

## 2023-01-04 ENCOUNTER — Other Ambulatory Visit: Payer: Self-pay | Admitting: Family Medicine

## 2023-01-05 ENCOUNTER — Other Ambulatory Visit (HOSPITAL_COMMUNITY): Payer: Self-pay

## 2023-01-05 MED ORDER — SILDENAFIL CITRATE 100 MG PO TABS
100.0000 mg | ORAL_TABLET | Freq: Every day | ORAL | 0 refills | Status: AC | PRN
  Filled 2023-01-05: qty 10, 10d supply, fill #0

## 2023-01-06 ENCOUNTER — Other Ambulatory Visit (HOSPITAL_COMMUNITY): Payer: Self-pay

## 2023-01-08 ENCOUNTER — Other Ambulatory Visit (HOSPITAL_COMMUNITY): Payer: Self-pay

## 2023-01-11 ENCOUNTER — Other Ambulatory Visit (HOSPITAL_COMMUNITY): Payer: Self-pay

## 2023-02-05 ENCOUNTER — Other Ambulatory Visit (HOSPITAL_COMMUNITY): Payer: Self-pay

## 2023-02-05 ENCOUNTER — Other Ambulatory Visit: Payer: Self-pay

## 2023-02-08 ENCOUNTER — Other Ambulatory Visit: Payer: Self-pay

## 2023-02-08 ENCOUNTER — Other Ambulatory Visit (HOSPITAL_COMMUNITY): Payer: Self-pay

## 2023-02-16 ENCOUNTER — Other Ambulatory Visit (HOSPITAL_COMMUNITY): Payer: Self-pay

## 2023-03-02 ENCOUNTER — Other Ambulatory Visit (HOSPITAL_COMMUNITY): Payer: Self-pay

## 2023-03-08 ENCOUNTER — Other Ambulatory Visit (HOSPITAL_COMMUNITY): Payer: Self-pay

## 2023-03-08 ENCOUNTER — Ambulatory Visit (INDEPENDENT_AMBULATORY_CARE_PROVIDER_SITE_OTHER): Payer: 59 | Admitting: Family Medicine

## 2023-03-08 ENCOUNTER — Encounter: Payer: Self-pay | Admitting: Family Medicine

## 2023-03-08 VITALS — BP 148/94 | HR 80 | Temp 97.6°F | Ht 69.0 in | Wt 276.0 lb

## 2023-03-08 DIAGNOSIS — G8929 Other chronic pain: Secondary | ICD-10-CM

## 2023-03-08 DIAGNOSIS — M25512 Pain in left shoulder: Secondary | ICD-10-CM

## 2023-03-08 DIAGNOSIS — E782 Mixed hyperlipidemia: Secondary | ICD-10-CM | POA: Diagnosis not present

## 2023-03-08 DIAGNOSIS — I10 Essential (primary) hypertension: Secondary | ICD-10-CM | POA: Diagnosis not present

## 2023-03-08 DIAGNOSIS — R7303 Prediabetes: Secondary | ICD-10-CM

## 2023-03-08 DIAGNOSIS — Z7689 Persons encountering health services in other specified circumstances: Secondary | ICD-10-CM

## 2023-03-08 MED ORDER — AMLODIPINE BESYLATE 5 MG PO TABS
5.0000 mg | ORAL_TABLET | Freq: Every day | ORAL | 0 refills | Status: DC
Start: 2023-03-08 — End: 2023-06-11
  Filled 2023-03-08: qty 90, 90d supply, fill #0

## 2023-03-08 NOTE — Progress Notes (Signed)
New Patient Office Visit  Subjective    Patient ID: Jim Hunter, male    DOB: 1966/06/12  Age: 57 y.o. MRN: [id number]  CC:  Chief Complaint  Patient presents with   Establish Care    No concerns, wants to go over health    HPI [name] presents to establish care  Other providers: GI- Dr. Leone Payor Cardiologist- Dr. Lynnette Caffey    HTN- stopped medications approximately 6 months ago.  Bad eating habits in general per patient. High sodium diet.  Previously on hydrochlorothiazide 25 mg and amlodipine 10 mg   Cough with lisinopril. Myalgias with losartan and valsartan   Attributes weight gain to switching to 3rd shift.  Obesity- Wegovy ordered in past but not covered.  Current insurance does not cover GLP-1s for weight loss.   HLD- previously on Zetia and Lovaza but stopped them   Hives with statin and myalgias with fenofibrate   Last saw cardiologist in 03/2022  He was also referred to [facility] by his cardiologist for HTN and HLD. He was reportedly not pleased about copays so he did not follow up.   Coronary calcium score 0 in 2023   States he had sleep studies and was told he had mild sleep apnea.   States he is a mouth breather. Has not used a CPAP.   Does not smoke. Former smoker, 10 pack yr hx Alcohol use- 2-3 days per week   Last colonoscopy in 2020   Married and cares for his wife.    Outpatient Encounter Medications as of 03/08/2023  Medication Sig   amLODipine (NORVASC) 5 MG tablet Take 1 tablet (5 mg total) by mouth daily.   [DISCONTINUED] omeprazole (PRILOSEC) 20 MG capsule TAKE 1 CAPSULE BY MOUTH ONCE DAILY   esomeprazole (NEXIUM) 40 MG capsule Take 1 capsule (40 mg total) by mouth daily. (Patient not taking: Reported on 03/08/2023)   ezetimibe (ZETIA) 10 MG tablet Take 1 tablet (10 mg total) by mouth daily. (Patient not taking: Reported on 03/08/2023)   fluticasone (FLONASE) 50 MCG/ACT nasal spray Place 2 sprays into both nostrils daily. (Patient not  taking: Reported on 03/08/2023)   hydrochlorothiazide (HYDRODIURIL) 25 MG tablet Take 1 tablet (25 mg total) by mouth daily. (Patient not taking: Reported on 03/08/2023)   omega-3 acid ethyl esters (LOVAZA) 1 g capsule Take 2 capsules (2 g total) by mouth 2 (two) times daily. (Patient not taking: Reported on 03/08/2023)   Semaglutide-Weight Management (WEGOVY) 0.25 MG/0.5ML SOAJ Inject 0.25 mg into the skin once a week. Continue for four weeks and then increase dosage (Patient not taking: Reported on 03/08/2023)   sildenafil (VIAGRA) 100 MG tablet Take 1 tablet (100 mg total) by mouth daily as needed. (Patient not taking: Reported on 03/08/2023)   [DISCONTINUED] amLODipine (NORVASC) 10 MG tablet Take 1 tablet (10 mg total) by mouth at bedtime. (Patient not taking: Reported on 03/08/2023)   [DISCONTINUED] lisinopril-hydrochlorothiazide (ZESTORETIC) 20-12.5 MG tablet TAKE 1 TABLET BY MOUTH DAILY.   [DISCONTINUED] losartan (COZAAR) 25 MG tablet Take 1 tablet (25 mg total) by mouth at bedtime.   No facility-administered encounter medications on file as of 03/08/2023.    Past Medical History:  Diagnosis Date   Arthritis    GERD (gastroesophageal reflux disease)    Hx of adenomatous polyp of colon 02/03/2019   Hyperlipidemia    Hypertension    PPD screening test last 9 years ago first started as child   treated as child for positive ppd test  Sleep apnea 3 years ago   no borderline no cpap needed per sleep studty done   Tear of right rotator cuff 04/27/2016   DOI: 03-14-2016-lifting 20-25 lb tote with saline at shoulder height  MRI (05-02-2016)-full thickness anterior supraspinatus (FI-0/1) tear with   minimal retraction, SLAP degeneration      Past Surgical History:  Procedure Laterality Date   LUMBAR DISC SURGERY  2006   ROTATOR CUFF REPAIR  2010   left   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   Procedure: RIGHT TOTAL KNEE ARTHROPLASTY;  Surgeon: Durene Romans, MD;  Location: [facility];  Service:  Orthopedics;  Laterality: Right;   TYMPANOPLASTY     age 65    Family History  Problem Relation Age of Onset   Hypertension Mother    Hypertension Brother    Hypertension Maternal Grandmother    Hypertension Sister    Colon cancer Neg Hx    Colon polyps Neg Hx    Esophageal cancer Neg Hx    Rectal cancer Neg Hx    Stomach cancer Neg Hx    Diabetes Neg Hx    Kidney disease Neg Hx    Liver disease Neg Hx    Prostate cancer Neg Hx     Social History   Socioeconomic History   Marital status: Divorced    Spouse name: Not on file   Number of children: Not on file   Years of education: Not on file   Highest education level: Not on file  Occupational History   Occupation: Pharmacologist: [employer]  Tobacco Use   Smoking status: Former    Current packs/day: 1.00    Average packs/day: 1 pack/day for 10.0 years (10.0 ttl pk-yrs)    Types: Cigarettes   Smokeless tobacco: Never  Vaping Use   Vaping status: Never Used  Substance and Sexual Activity   Alcohol use: Yes    Comment: Ocasional    Drug use: No   Sexual activity: Yes  Other Topics Concern   Not on file  Social History Narrative   Not on file   Social Determinants of Health   Financial Resource Strain: Not on file  Food Insecurity: Not on file  Transportation Needs: Not on file  Physical Activity: Not on file  Stress: Not on file  Social Connections: Not on file  Intimate Partner Violence: Not on file    Review of Systems  Constitutional:  Negative for chills, fever, malaise/fatigue and weight loss.  Respiratory:  Positive for shortness of breath.        Mild with exertion  Cardiovascular:  Negative for chest pain, palpitations and leg swelling.  Gastrointestinal:  Negative for abdominal pain, constipation, diarrhea, nausea and vomiting.  Genitourinary:  Negative for dysuria, frequency and urgency.  Musculoskeletal:  Positive for joint pain.  Neurological:  Negative for dizziness  and focal weakness.        Objective    BP (!) 148/94 (BP Location: Left Arm, Patient Position: Sitting, Cuff Size: Large)   Pulse 80   Temp 97.6 F (36.4 C) (Temporal)   Ht 5\' 9"  (1.753 m)   Wt 276 lb (125.2 kg)   SpO2 96%   BMI 40.76 kg/m   Physical Exam Constitutional:      General: He is not in acute distress.    Appearance: He is not ill-appearing.  Eyes:     Extraocular Movements: Extraocular movements intact.     Conjunctiva/sclera: Conjunctivae normal.  Cardiovascular:     Rate and Rhythm: Normal rate and regular rhythm.  Pulmonary:     Effort: Pulmonary effort is normal.     Breath sounds: Normal breath sounds.  Musculoskeletal:     Cervical back: Normal range of motion.     Right lower leg: No edema.     Left lower leg: No edema.  Skin:    General: Skin is warm and dry.  Neurological:     General: No focal deficit present.     Mental Status: He is alert and oriented to person, place, and time.  Psychiatric:        Mood and Affect: Mood normal.        Behavior: Behavior normal.        Thought Content: Thought content normal.         Assessment & Plan:   Problem List Items Addressed This Visit       Cardiovascular and Mediastinum   Hypertension - Primary    Uncontrolled. Stopped his medications approximately 6 months ago. Recommend restarting amlodipine 5mg  daily. Start checking BP at home consistently. Follow up for CPE and labs next week. DASH diet handout provided.       Relevant Medications   amLODipine (NORVASC) 5 MG tablet     Other   Chronic left shoulder pain    He will follow up with orthopedist or request new referral if needed.       Hyperlipidemia    Stopped cholesterol medications 6 months ago. Most recently prescribed Zetia and Lovaza. Follow up next week for fasting lipids and CPE      Relevant Medications   amLODipine (NORVASC) 5 MG tablet   Prediabetes    Recommend reducing sugar and carbohydrates. Check A1c next week  at follow up CPE      Severe obesity (BMI >= 40) (HCC)    Discuss healthy diet and exercise at follow up. Consider referral to [facility]      Other Visit Diagnoses     Encounter to establish care          Here to establish care.  Reviewed notes from previous PCP visits, cardiologist and results.  Stopped all medications 6 months ago.  Discussed potential health consequences related to uncontrolled HTN and untreated sleep apnea.  Restart amlodipine 5 mg daily. Check BP at home and bring in readings. Low salt diet.  Follow up next week for fasting labs and CPE  Return in about 1 week (around 03/15/2023) for fasting CPE.   Hetty Blend, NP-C

## 2023-03-08 NOTE — Assessment & Plan Note (Signed)
Discuss healthy diet and exercise at follow up. Consider referral to [facility]

## 2023-03-08 NOTE — Assessment & Plan Note (Signed)
Stopped cholesterol medications 6 months ago. Most recently prescribed Zetia and Lovaza. Follow up next week for fasting lipids and CPE

## 2023-03-08 NOTE — Assessment & Plan Note (Signed)
He will follow up with orthopedist or request new referral if needed.

## 2023-03-08 NOTE — Assessment & Plan Note (Signed)
Uncontrolled. Stopped his medications approximately 6 months ago. Recommend restarting amlodipine 5mg  daily. Start checking BP at home consistently. Follow up for CPE and labs next week. DASH diet handout provided.

## 2023-03-08 NOTE — Patient Instructions (Addendum)
Start amlodipine 5 mg once daily.   Start checking your blood pressure at home.  Bring in the readings to your visit next week.   See the DASH diet handout

## 2023-03-08 NOTE — Assessment & Plan Note (Signed)
Recommend reducing sugar and carbohydrates. Check A1c next week at follow up CPE

## 2023-03-15 ENCOUNTER — Encounter: Payer: Self-pay | Admitting: Family Medicine

## 2023-03-15 ENCOUNTER — Ambulatory Visit (INDEPENDENT_AMBULATORY_CARE_PROVIDER_SITE_OTHER): Payer: 59 | Admitting: Family Medicine

## 2023-03-15 VITALS — BP 134/96 | HR 90 | Temp 97.6°F | Ht 69.0 in | Wt 274.0 lb

## 2023-03-15 DIAGNOSIS — Z0001 Encounter for general adult medical examination with abnormal findings: Secondary | ICD-10-CM | POA: Diagnosis not present

## 2023-03-15 DIAGNOSIS — E782 Mixed hyperlipidemia: Secondary | ICD-10-CM | POA: Diagnosis not present

## 2023-03-15 DIAGNOSIS — R7303 Prediabetes: Secondary | ICD-10-CM | POA: Diagnosis not present

## 2023-03-15 DIAGNOSIS — Z125 Encounter for screening for malignant neoplasm of prostate: Secondary | ICD-10-CM | POA: Diagnosis not present

## 2023-03-15 DIAGNOSIS — I10 Essential (primary) hypertension: Secondary | ICD-10-CM | POA: Diagnosis not present

## 2023-03-15 LAB — CBC WITH DIFFERENTIAL/PLATELET
Basophils Absolute: 0.1 10*3/uL (ref 0.0–0.1)
Basophils Relative: 0.8 % (ref 0.0–3.0)
Eosinophils Absolute: 0.5 10*3/uL (ref 0.0–0.7)
Eosinophils Relative: 7.1 % — ABNORMAL HIGH (ref 0.0–5.0)
HCT: 38.5 % — ABNORMAL LOW (ref 39.0–52.0)
Hemoglobin: 12.3 g/dL — ABNORMAL LOW (ref 13.0–17.0)
Lymphocytes Relative: 30.4 % (ref 12.0–46.0)
Lymphs Abs: 2.1 10*3/uL (ref 0.7–4.0)
MCHC: 31.8 g/dL (ref 30.0–36.0)
MCV: 78.7 fl (ref 78.0–100.0)
Monocytes Absolute: 0.6 10*3/uL (ref 0.1–1.0)
Monocytes Relative: 7.9 % (ref 3.0–12.0)
Neutro Abs: 3.8 10*3/uL (ref 1.4–7.7)
Neutrophils Relative %: 53.8 % (ref 43.0–77.0)
Platelets: 300 10*3/uL (ref 150.0–400.0)
RBC: 4.9 Mil/uL (ref 4.22–5.81)
RDW: 17.4 % — ABNORMAL HIGH (ref 11.5–15.5)
WBC: 7.1 10*3/uL (ref 4.0–10.5)

## 2023-03-15 LAB — COMPREHENSIVE METABOLIC PANEL
ALT: 31 U/L (ref 0–53)
AST: 28 U/L (ref 0–37)
Albumin: 4 g/dL (ref 3.5–5.2)
Alkaline Phosphatase: 87 U/L (ref 39–117)
BUN: 16 mg/dL (ref 6–23)
CO2: 26 mEq/L (ref 19–32)
Calcium: 9 mg/dL (ref 8.4–10.5)
Chloride: 102 mEq/L (ref 96–112)
Creatinine, Ser: 1 mg/dL (ref 0.40–1.50)
GFR: 83.25 mL/min (ref >60.00)
Glucose, Bld: 93 mg/dL (ref 70–99)
Potassium: 3.5 mEq/L (ref 3.5–5.1)
Sodium: 137 mEq/L (ref 135–145)
Total Bilirubin: 0.5 mg/dL (ref 0.2–1.2)
Total Protein: 7 g/dL (ref 6.0–8.3)

## 2023-03-15 LAB — HEMOGLOBIN A1C: Hgb A1c MFr Bld: 6.6 % — ABNORMAL HIGH (ref 4.6–6.5)

## 2023-03-15 LAB — LIPID PANEL
Cholesterol: 215 mg/dL — ABNORMAL HIGH (ref 0–200)
HDL: 33.8 mg/dL — ABNORMAL LOW (ref >39.00)
NonHDL: 180.95
Total CHOL/HDL Ratio: 6
Triglycerides: 203 mg/dL — ABNORMAL HIGH (ref 0.0–149.0)
VLDL: 40.6 mg/dL — ABNORMAL HIGH (ref 0.0–40.0)

## 2023-03-15 LAB — T4, FREE: Free T4: 0.94 ng/dL (ref 0.60–1.60)

## 2023-03-15 LAB — PSA: PSA: 0.64 ng/mL (ref 0.10–4.00)

## 2023-03-15 LAB — LDL CHOLESTEROL, DIRECT: Direct LDL: 187 mg/dL

## 2023-03-15 LAB — TSH: TSH: 1.99 u[IU]/mL (ref 0.35–5.50)

## 2023-03-15 NOTE — Assessment & Plan Note (Signed)
Stopped cholesterol medications 6 months ago. Most recently prescribed Zetia and Lovaza. Check lipids today

## 2023-03-15 NOTE — Assessment & Plan Note (Signed)
Encourage healthy diet and increased activity.

## 2023-03-15 NOTE — Progress Notes (Signed)
Complete physical exam  Patient: Jim Hunter   DOB: 05/31/1966   57 y.o. Male  MRN: [id number]  Subjective:    Chief Complaint  Patient presents with   Annual Exam    fasting   He is here for a complete physical exam.  Started on amlodipine 1 wk ago.      Health Maintenance  Topic Date Due   Zoster (Shingles) Vaccine (1 of 2) Never done   DTaP/Tdap/Td vaccine (2 - Td or Tdap) 10/03/2022   COVID-19 Vaccine (6 - 2023-24 season) 03/24/2023*   Flu Shot  10/15/2023*   Colon Cancer Screening  01/24/2026   Hepatitis C Screening  Completed   HIV Screening  Completed   HPV Vaccine  Aged Out  *Topic was postponed. The date shown is not the original due date.    Smoke detectors in home and functioning, feels safe in home environment.  Depression screening:    03/08/2023    8:43 AM 05/10/2022    8:42 AM 03/10/2022    8:40 AM  Depression screen PHQ 2/9  Decreased Interest 0 0 0  Down, Depressed, Hopeless 0 0 0  PHQ - 2 Score 0 0 0  Altered sleeping  0   Tired, decreased energy  0   Change in appetite  0   Feeling bad or failure about yourself   0   Trouble concentrating  0   Moving slowly or fidgety/restless  0   Suicidal thoughts  0   PHQ-9 Score  0    Anxiety Screening:     No data to display          Vision:Not within last year  and Dental: No current dental problems and No regular dental care   Patient Active Problem List   Diagnosis Date Noted   Severe obesity (BMI >= 40) (HCC) 03/08/2023   Chronic left shoulder pain 03/08/2023   Prediabetes 07/06/2021   DDD (degenerative disc disease), lumbar 08/06/2020   Hx of adenomatous polyp of colon 02/03/2019   DOE (dyspnea on exertion) 12/30/2018   Healthcare maintenance 11/03/2015   OSA (obstructive sleep apnea) 11/03/2015   S/P right TKA 09/29/2014   S/P knee replacement 09/29/2014   Alcohol use 07/06/2014   Erectile dysfunction 05/26/2014   GERD (gastroesophageal reflux disease) 10/02/2012   Umbilical  hernia 10/02/2012   Hypertension 07/29/2012   Hyperlipidemia 07/29/2012   Obesity (BMI 30-39.9) 07/29/2012   Past Medical History:  Diagnosis Date   Arthritis    GERD (gastroesophageal reflux disease)    Hx of adenomatous polyp of colon 02/03/2019   Hyperlipidemia    Hypertension    PPD screening test last 9 years ago first started as child   treated as child for positive ppd test   Sleep apnea 3 years ago   no borderline no cpap needed per sleep studty done   Tear of right rotator cuff 04/27/2016   DOI: 03-14-2016-lifting 20-25 lb tote with saline at shoulder height  MRI (05-02-2016)-full thickness anterior supraspinatus (FI-0/1) tear with   minimal retraction, SLAP degeneration     Past Surgical History:  Procedure Laterality Date   LUMBAR DISC SURGERY  2006   ROTATOR CUFF REPAIR  2010   left   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   Procedure: RIGHT TOTAL KNEE ARTHROPLASTY;  Surgeon: Durene Romans, MD;  Location: [facility];  Service: Orthopedics;  Laterality: Right;   TYMPANOPLASTY     age 89   Social History   Tobacco  Use   Smoking status: Former    Current packs/day: 1.00    Average packs/day: 1 pack/day for 10.0 years (10.0 ttl pk-yrs)    Types: Cigarettes   Smokeless tobacco: Never  Vaping Use   Vaping status: Never Used  Substance Use Topics   Alcohol use: Yes    Comment: Ocasional    Drug use: No      Patient Care Team: Avanell Shackleton, NP-C as PCP - General (Family Medicine)   Outpatient Medications Prior to Visit  Medication Sig   amLODipine (NORVASC) 5 MG tablet Take 1 tablet (5 mg total) by mouth daily.   esomeprazole (NEXIUM) 40 MG capsule Take 1 capsule (40 mg total) by mouth daily. (Patient not taking: Reported on 03/08/2023)   ezetimibe (ZETIA) 10 MG tablet Take 1 tablet (10 mg total) by mouth daily. (Patient not taking: Reported on 03/08/2023)   fluticasone (FLONASE) 50 MCG/ACT nasal spray Place 2 sprays into both nostrils daily. (Patient not  taking: Reported on 03/08/2023)   hydrochlorothiazide (HYDRODIURIL) 25 MG tablet Take 1 tablet (25 mg total) by mouth daily. (Patient not taking: Reported on 03/08/2023)   omega-3 acid ethyl esters (LOVAZA) 1 g capsule Take 2 capsules (2 g total) by mouth 2 (two) times daily. (Patient not taking: Reported on 03/08/2023)   Semaglutide-Weight Management (WEGOVY) 0.25 MG/0.5ML SOAJ Inject 0.25 mg into the skin once a week. Continue for four weeks and then increase dosage (Patient not taking: Reported on 03/08/2023)   sildenafil (VIAGRA) 100 MG tablet Take 1 tablet (100 mg total) by mouth daily as needed. (Patient not taking: Reported on 03/08/2023)   No facility-administered medications prior to visit.    Review of Systems  Constitutional:  Positive for malaise/fatigue. Negative for chills, fever and weight loss.  HENT:  Negative for congestion, ear pain, sinus pain and sore throat.   Eyes:  Negative for blurred vision, double vision and pain.  Respiratory:  Negative for cough, shortness of breath and wheezing.   Cardiovascular:  Negative for chest pain, palpitations and leg swelling.  Gastrointestinal:  Negative for abdominal pain, constipation, diarrhea, nausea and vomiting.  Genitourinary:  Negative for dysuria, frequency and urgency.  Musculoskeletal:  Negative for back pain, joint pain and myalgias.  Skin:  Negative for rash.  Neurological:  Negative for dizziness, tingling, focal weakness and headaches.  Endo/Heme/Allergies:  Does not bruise/bleed easily.  Psychiatric/Behavioral:  Negative for depression and suicidal ideas. The patient is not nervous/anxious.        Objective:    BP (!) 134/96 (BP Location: Left Arm, Patient Position: Sitting, Cuff Size: Large)   Pulse 90   Temp 97.6 F (36.4 C) (Temporal)   Ht 5\' 9"  (1.753 m)   Wt 274 lb (124.3 kg)   SpO2 98%   BMI 40.46 kg/m  BP Readings from Last 3 Encounters:  03/15/23 (!) 134/96  03/08/23 (!) 148/94  05/10/22 134/74   Wt  Readings from Last 3 Encounters:  03/15/23 274 lb (124.3 kg)  03/08/23 276 lb (125.2 kg)  05/10/22 274 lb (124.3 kg)    Physical Exam Constitutional:      General: He is not in acute distress.    Appearance: He is obese. He is not ill-appearing.  HENT:     Right Ear: Tympanic membrane, ear canal and external ear normal.     Left Ear: Ear canal and external ear normal.     Ears:     Comments: Significant scarring  Nose: Nose normal.     Mouth/Throat:     Mouth: Mucous membranes are moist.     Pharynx: Oropharynx is clear.  Eyes:     Extraocular Movements: Extraocular movements intact.     Conjunctiva/sclera: Conjunctivae normal.     Pupils: Pupils are equal, round, and reactive to light.  Neck:     Thyroid: No thyroid mass, thyromegaly or thyroid tenderness.  Cardiovascular:     Rate and Rhythm: Normal rate and regular rhythm.     Pulses: Normal pulses.  Pulmonary:     Effort: Pulmonary effort is normal.     Breath sounds: Normal breath sounds.  Abdominal:     General: Bowel sounds are normal. There is no distension.     Palpations: Abdomen is soft.     Tenderness: There is no abdominal tenderness. There is no right CVA tenderness, left CVA tenderness, guarding or rebound.  Genitourinary:    Epididymis:     Right: Normal.     Left: Normal.  Musculoskeletal:        General: Normal range of motion.     Cervical back: Normal range of motion and neck supple. No tenderness.     Right lower leg: No edema.     Left lower leg: No edema.  Lymphadenopathy:     Cervical: No cervical adenopathy.  Skin:    General: Skin is warm and dry.     Findings: No lesion or rash.  Neurological:     General: No focal deficit present.     Mental Status: He is alert and oriented to person, place, and time.     Cranial Nerves: No cranial nerve deficit.     Sensory: No sensory deficit.     Motor: No weakness.  Psychiatric:        Mood and Affect: Mood normal.        Behavior: Behavior  normal.        Thought Content: Thought content normal.      No results found for any visits on 03/15/23.    Assessment & Plan:    Routine Health Maintenance and Physical Exam  Problem List Items Addressed This Visit       Cardiovascular and Mediastinum   Hypertension    Uncontrolled. Stopped his medications approximately 6 months ago. Restarted amlodipine 5mg  1 wk ago. Follow up for HTN in 4 wks. Check renal function      Relevant Orders   CBC with Differential/Platelet   Comprehensive metabolic panel     Other   Hyperlipidemia    Stopped cholesterol medications 6 months ago. Most recently prescribed Zetia and Lovaza. Check lipids today      Relevant Orders   Lipid panel   Prediabetes    Check A1c      Relevant Orders   Hemoglobin A1c   TSH   T4, free   Severe obesity (BMI >= 40) (HCC)    Encourage healthy diet and increased activity.       Relevant Orders   TSH   T4, free   Other Visit Diagnoses     Encounter for general adult medical examination with abnormal findings    -  Primary   Screening for prostate cancer       Relevant Orders   PSA      Preventive health care reviewed.  Colonoscopy UTD. PSA ordered. Counseling on healthy lifestyle including diet and exercise.  Recommend regular dental and eye exams.  Immunizations reviewed.  Discussed safety.   Return in about 4 weeks (around 04/12/2023) for hypertension.     Hetty Blend, NP-C

## 2023-03-15 NOTE — Assessment & Plan Note (Signed)
Check A1c. 

## 2023-03-15 NOTE — Assessment & Plan Note (Addendum)
Uncontrolled. Stopped his medications approximately 6 months ago. Restarted amlodipine 5mg  1 wk ago. Follow up for HTN in 4 wks. Check renal function

## 2023-03-15 NOTE — Patient Instructions (Signed)
Continue amlodipine daily.  Follow up for your blood pressure in 4 weeks.

## 2023-03-16 NOTE — Progress Notes (Signed)
His blood sugars are now in diabetes range. His cholesterol is very high. Please ask him to schedule a visit with me to discuss starting on medications for diabetes and cholesterol. Cut back on sugar, carbohydrates (potatoes, bread, pasta, rice) and foods high in saturated fat. Limit fried foods.

## 2023-03-22 ENCOUNTER — Ambulatory Visit (INDEPENDENT_AMBULATORY_CARE_PROVIDER_SITE_OTHER): Payer: 59 | Admitting: Family Medicine

## 2023-03-22 ENCOUNTER — Other Ambulatory Visit (HOSPITAL_COMMUNITY): Payer: Self-pay

## 2023-03-22 ENCOUNTER — Encounter: Payer: Self-pay | Admitting: Family Medicine

## 2023-03-22 VITALS — BP 136/92 | HR 80 | Temp 97.6°F | Ht 69.0 in | Wt 274.0 lb

## 2023-03-22 DIAGNOSIS — I10 Essential (primary) hypertension: Secondary | ICD-10-CM | POA: Diagnosis not present

## 2023-03-22 DIAGNOSIS — Z6841 Body Mass Index (BMI) 40.0 and over, adult: Secondary | ICD-10-CM | POA: Diagnosis not present

## 2023-03-22 DIAGNOSIS — E119 Type 2 diabetes mellitus without complications: Secondary | ICD-10-CM

## 2023-03-22 DIAGNOSIS — E782 Mixed hyperlipidemia: Secondary | ICD-10-CM | POA: Diagnosis not present

## 2023-03-22 DIAGNOSIS — E669 Obesity, unspecified: Secondary | ICD-10-CM | POA: Insufficient documentation

## 2023-03-22 DIAGNOSIS — Z7984 Long term (current) use of oral hypoglycemic drugs: Secondary | ICD-10-CM

## 2023-03-22 MED ORDER — METFORMIN HCL ER 500 MG PO TB24
500.0000 mg | ORAL_TABLET | Freq: Every day | ORAL | 0 refills | Status: DC
  Filled 2023-03-22: qty 90, 90d supply, fill #0

## 2023-03-22 NOTE — Progress Notes (Signed)
Subjective:     Patient ID: Jim Hunter, male    DOB: 28-Sep-1965, 57 y.o.   MRN: [id number]  Chief Complaint  Patient presents with   Medication Management    Discuss starting on medication for diabetes and high cholesterol    HPI  Discussed the use of AI scribe software for clinical note transcription with the patient, who gave verbal consent to proceed.  History of Present Illness          Here to discuss new diagnosis of Diabetes and other chronic health conditions.   A1c 6.6%, previously prediabetes range.   HLD- LDL 187 Hives in the past with Crestor and Lipitor  He stopped Zetia in the past-thought it was causing muscle aches.   Previously on Lovaza but stopped this as well.   HTN- taking amlodipine for now.   He has other HTN medications at home.   Cough with lisinopril. Myalgias with losartan and valsartan   Last saw cardiologist in 03/2022   He was also referred to [facility] by his cardiologist for HTN and HLD. He was reportedly not pleased about copays so he did not follow up.    Coronary calcium score 0 in 2023    States he had sleep studies and was told he had mild sleep apnea.   States he is a mouth breather. Has not used a CPAP.   Health Maintenance Due  Topic Date Due   FOOT EXAM  Never done   OPHTHALMOLOGY EXAM  Never done   Diabetic kidney evaluation - Urine ACR  Never done   Zoster Vaccines- Shingrix (1 of 2) Never done   DTaP/Tdap/Td (2 - Td or Tdap) 10/03/2022    Past Medical History:  Diagnosis Date   Arthritis    GERD (gastroesophageal reflux disease)    Hx of adenomatous polyp of colon 02/03/2019   Hyperlipidemia    Hypertension    PPD screening test last 9 years ago first started as child   treated as child for positive ppd test   Sleep apnea 3 years ago   no borderline no cpap needed per sleep studty done   Tear of right rotator cuff 04/27/2016   DOI: 03-14-2016-lifting 20-25 lb tote with saline at shoulder height  MRI  (05-02-2016)-full thickness anterior supraspinatus (FI-0/1) tear with   minimal retraction, SLAP degeneration      Past Surgical History:  Procedure Laterality Date   LUMBAR DISC SURGERY  2006   ROTATOR CUFF REPAIR  2010   left   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   Procedure: RIGHT TOTAL KNEE ARTHROPLASTY;  Surgeon: Durene Romans, MD;  Location: [facility];  Service: Orthopedics;  Laterality: Right;   TYMPANOPLASTY     age 32    Family History  Problem Relation Age of Onset   Hypertension Mother    Hypertension Brother    Hypertension Maternal Grandmother    Hypertension Sister    Colon cancer Neg Hx    Colon polyps Neg Hx    Esophageal cancer Neg Hx    Rectal cancer Neg Hx    Stomach cancer Neg Hx    Diabetes Neg Hx    Kidney disease Neg Hx    Liver disease Neg Hx    Prostate cancer Neg Hx     Social History   Socioeconomic History   Marital status: Divorced    Spouse name: Not on file   Number of children: Not on file   Years of education:  Not on file   Highest education level: Not on file  Occupational History   Occupation: Engineer, production     Employer: [employer]  Tobacco Use   Smoking status: Former    Current packs/day: 1.00    Average packs/day: 1 pack/day for 10.0 years (10.0 ttl pk-yrs)    Types: Cigarettes   Smokeless tobacco: Never  Vaping Use   Vaping status: Never Used  Substance and Sexual Activity   Alcohol use: Yes    Comment: Ocasional    Drug use: No   Sexual activity: Yes  Other Topics Concern   Not on file  Social History Narrative   Not on file   Social Determinants of Health   Financial Resource Strain: Not on file  Food Insecurity: Not on file  Transportation Needs: Not on file  Physical Activity: Not on file  Stress: Not on file  Social Connections: Not on file  Intimate Partner Violence: Not on file    Outpatient Medications Prior to Visit  Medication Sig Dispense Refill   amLODipine (NORVASC) 5 MG tablet Take 1  tablet (5 mg total) by mouth daily. 90 tablet 0   esomeprazole (NEXIUM) 40 MG capsule Take 1 capsule (40 mg total) by mouth daily. (Patient not taking: Reported on 03/08/2023) 90 capsule 2   ezetimibe (ZETIA) 10 MG tablet Take 1 tablet (10 mg total) by mouth daily. (Patient not taking: Reported on 03/08/2023) 90 tablet 0   fluticasone (FLONASE) 50 MCG/ACT nasal spray Place 2 sprays into both nostrils daily. (Patient not taking: Reported on 03/08/2023) 16 g 6   hydrochlorothiazide (HYDRODIURIL) 25 MG tablet Take 1 tablet (25 mg total) by mouth daily. (Patient not taking: Reported on 03/08/2023) 90 tablet 0   omega-3 acid ethyl esters (LOVAZA) 1 g capsule Take 2 capsules (2 g total) by mouth 2 (two) times daily. (Patient not taking: Reported on 03/08/2023) 120 capsule 11   Semaglutide-Weight Management (WEGOVY) 0.25 MG/0.5ML SOAJ Inject 0.25 mg into the skin once a week. Continue for four weeks and then increase dosage (Patient not taking: Reported on 03/08/2023) 2 mL 0   sildenafil (VIAGRA) 100 MG tablet Take 1 tablet (100 mg total) by mouth daily as needed. (Patient not taking: Reported on 03/08/2023) 10 tablet 0   No facility-administered medications prior to visit.    Allergies  Allergen Reactions   Crestor [Rosuvastatin] Hives   Fenofibrate Other (See Comments)    Muscle aches   Lipitor [Atorvastatin Calcium] Hives   Valsartan     Muscle aches   Lisinopril Cough    Review of Systems  Constitutional:  Negative for chills and fever.  Respiratory:  Negative for shortness of breath.   Cardiovascular:  Negative for chest pain, palpitations and leg swelling.  Gastrointestinal:  Negative for abdominal pain, constipation, diarrhea, nausea and vomiting.  Genitourinary:  Negative for dysuria, frequency and urgency.  Neurological:  Negative for dizziness.       Objective:    Physical Exam Constitutional:      General: He is not in acute distress.    Appearance: He is not ill-appearing.  Eyes:      Extraocular Movements: Extraocular movements intact.     Conjunctiva/sclera: Conjunctivae normal.  Cardiovascular:     Rate and Rhythm: Normal rate.  Pulmonary:     Effort: Pulmonary effort is normal.  Musculoskeletal:     Cervical back: Normal range of motion and neck supple.  Skin:    General: Skin is warm and  dry.  Neurological:     General: No focal deficit present.     Mental Status: He is alert and oriented to person, place, and time.  Psychiatric:        Mood and Affect: Mood normal.        Behavior: Behavior normal.        Thought Content: Thought content normal.      BP (!) 136/92 (BP Location: Left Arm, Patient Position: Sitting, Cuff Size: Large)   Pulse 80   Temp 97.6 F (36.4 C) (Temporal)   Ht 5\' 9"  (1.753 m)   Wt 274 lb (124.3 kg)   SpO2 96%   BMI 40.46 kg/m  Wt Readings from Last 3 Encounters:  03/22/23 274 lb (124.3 kg)  03/15/23 274 lb (124.3 kg)  03/08/23 276 lb (125.2 kg)       Assessment & Plan:   Problem List Items Addressed This Visit       Cardiovascular and Mediastinum   Hypertension    Uncontrolled but improving. Stopped his medications approximately 6 months ago. Restarted amlodipine 5mg .  Consider restarting hydrochlorothiazide. He is hesitant to restart several new medications at one time so we are adding back slowly.          Endocrine   New onset type 2 diabetes mellitus (HCC) - Primary    Hemoglobin A1c 6.6%.  He is willing to start metformin 500 mg XR daily.  Declines monitor to check blood sugars at home.  Counseling on a low sugar, low carbohydrate diet.  Discussed the significance of diabetes and health in the setting of hypertension and hyperlipidemia.  He will let me know how he is doing on metformin via MyChart in approximately 4 weeks.  Recommend a 71-month follow-up.      Relevant Medications   metFORMIN (GLUCOPHAGE-XR) 500 MG 24 hr tablet     Other   Hyperlipidemia    Stopped cholesterol medications 7 months ago.  Most recently prescribed Zetia and Lovaza. LDL 187.  Discussed importance of treating cholesterol especially now that he has a new diagnosis of diabetes.  We discussed potential long-term health consequences such as heart disease, heart attack and stroke.  We discussed eating a low-fat diet and restarting Zetia in the next 4 weeks.  We should also restart Lovaza if he is tolerating Zetia.      Severe obesity (BMI >= 40) (HCC)    Encourage healthy diet and increased activity.  Starting metformin and hopefully this will be beneficial for weight      Relevant Medications   metFORMIN (GLUCOPHAGE-XR) 500 MG 24 hr tablet    I am having [name] start on metFORMIN. I am also having him maintain his fluticasone, omega-3 acid ethyl esters, Wegovy, esomeprazole, hydrochlorothiazide, ezetimibe, sildenafil, and amLODipine.  Meds ordered this encounter  Medications   metFORMIN (GLUCOPHAGE-XR) 500 MG 24 hr tablet    Sig: Take 1 tablet (500 mg total) by mouth daily with breakfast.    Dispense:  90 tablet    Refill:  0    Order Specific Question:   Supervising Provider    Answer:   Hillard Danker A [[id number]]

## 2023-03-22 NOTE — Assessment & Plan Note (Addendum)
Hemoglobin A1c 6.6%.  He is willing to start metformin 500 mg XR daily.  Declines monitor to check blood sugars at home.  Counseling on a low sugar, low carbohydrate diet.  Discussed the significance of diabetes and health in the setting of hypertension and hyperlipidemia.  He will let me know how he is doing on metformin via MyChart in approximately 4 weeks.  Recommend a 50-month follow-up.

## 2023-03-22 NOTE — Patient Instructions (Signed)
Continue amlodipine daily.   Buy a new blood pressure machine and check your blood pressure at home.   Goal is <130/80  You can start the hydrochlorothiazide once daily for blood pressure in 2 weeks if your blood pressures are still high.   Start metformin for your diabetes. Your A1c is 6.6% as discussed.   Cut back on sugar, bread, potatoes, pasta, rice.   Your cholesterol is elevated. Cut back on fried foods, foods high in fat and processed foods to help lower this.   Let me know in 4 weeks how you are doing with metformin.   Start Zetia if you are doing fine on metformin.   Follow up with me in 3 months. Please come in fasting (nothing to eat or drink except water or black coffee for at least 6-8 hours).    Diabetes Mellitus and Nutrition, Adult When you have diabetes, or diabetes mellitus, it is very important to have healthy eating habits because your blood sugar (glucose) levels are greatly affected by what you eat and drink. Eating healthy foods in the right amounts, at about the same times every day, can help you: Manage your blood glucose. Lower your risk of heart disease. Improve your blood pressure. Reach or maintain a healthy weight. What can affect my meal plan? Every person with diabetes is different, and each person has different needs for a meal plan. Your health care provider may recommend that you work with a dietitian to make a meal plan that is best for you. Your meal plan may vary depending on factors such as: The calories you need. The medicines you take. Your weight. Your blood glucose, blood pressure, and cholesterol levels. Your activity level. Other health conditions you have, such as heart or kidney disease. How do carbohydrates affect me? Carbohydrates, also called carbs, affect your blood glucose level more than any other type of food. Eating carbs raises the amount of glucose in your blood. It is important to know how many carbs you can safely have  in each meal. This is different for every person. Your dietitian can help you calculate how many carbs you should have at each meal and for each snack. How does alcohol affect me? Alcohol can cause a decrease in blood glucose (hypoglycemia), especially if you use insulin or take certain diabetes medicines by mouth. Hypoglycemia can be a life-threatening condition. Symptoms of hypoglycemia, such as sleepiness, dizziness, and confusion, are similar to symptoms of having too much alcohol. Do not drink alcohol if: Your health care provider tells you not to drink. You are pregnant, may be pregnant, or are planning to become pregnant. If you drink alcohol: Limit how much you have to: 0-1 drink a day for women. 0-2 drinks a day for men. Know how much alcohol is in your drink. In the [place] one drink equals one 12 oz bottle of beer (355 mL), one 5 oz glass of wine (148 mL), or one 1 oz glass of hard liquor (44 mL). Keep yourself hydrated with water, diet soda, or unsweetened iced tea. Keep in mind that regular soda, juice, and other mixers may contain a lot of sugar and must be counted as carbs. What are tips for following this plan?  Reading food labels Start by checking the serving size on the Nutrition Facts label of packaged foods and drinks. The number of calories and the amount of carbs, fats, and other nutrients listed on the label are based on one serving of the item. Many  items contain more than one serving per package. Check the total grams (g) of carbs in one serving. Check the number of grams of saturated fats and trans fats in one serving. Choose foods that have a low amount or none of these fats. Check the number of milligrams (mg) of salt (sodium) in one serving. Most people should limit total sodium intake to less than 2,300 mg per day. Always check the nutrition information of foods labeled as "low-fat" or "nonfat." These foods may be higher in added sugar or refined carbs and should be  avoided. Talk to your dietitian to identify your daily goals for nutrients listed on the label. Shopping Avoid buying canned, pre-made, or processed foods. These foods tend to be high in fat, sodium, and added sugar. Shop around the outside edge of the grocery store. This is where you will most often find fresh fruits and vegetables, bulk grains, fresh meats, and fresh dairy products. Cooking Use low-heat cooking methods, such as baking, instead of high-heat cooking methods, such as deep frying. Cook using healthy oils, such as olive, canola, or sunflower oil. Avoid cooking with butter, cream, or high-fat meats. Meal planning Eat meals and snacks regularly, preferably at the same times every day. Avoid going long periods of time without eating. Eat foods that are high in fiber, such as fresh fruits, vegetables, beans, and whole grains. Eat 4-6 oz (112-168 g) of lean protein each day, such as lean meat, chicken, fish, eggs, or tofu. One ounce (oz) (28 g) of lean protein is equal to: 1 oz (28 g) of meat, chicken, or fish. 1 egg.  cup (62 g) of tofu. Eat some foods each day that contain healthy fats, such as avocado, nuts, seeds, and fish. What foods should I eat? Fruits Berries. Apples. Oranges. Peaches. Apricots. Plums. Grapes. Mangoes. Papayas. Pomegranates. Kiwi. Cherries. Vegetables Leafy greens, including lettuce, spinach, kale, chard, collard greens, mustard greens, and cabbage. Beets. Cauliflower. Broccoli. Carrots. Green beans. Tomatoes. Peppers. Onions. Cucumbers. Brussels sprouts. Grains Whole grains, such as whole-wheat or whole-grain bread, crackers, tortillas, cereal, and pasta. Unsweetened oatmeal. Quinoa. Brown or wild rice. Meats and other proteins Seafood. Poultry without skin. Lean cuts of poultry and beef. Tofu. Nuts. Seeds. Dairy Low-fat or fat-free dairy products such as milk, yogurt, and cheese. The items listed above may not be a complete list of foods and beverages  you can eat and drink. Contact a dietitian for more information. What foods should I avoid? Fruits Fruits canned with syrup. Vegetables Canned vegetables. Frozen vegetables with butter or cream sauce. Grains Refined white flour and flour products such as bread, pasta, snack foods, and cereals. Avoid all processed foods. Meats and other proteins Fatty cuts of meat. Poultry with skin. Breaded or fried meats. Processed meat. Avoid saturated fats. Dairy Full-fat yogurt, cheese, or milk. Beverages Sweetened drinks, such as soda or iced tea. The items listed above may not be a complete list of foods and beverages you should avoid. Contact a dietitian for more information. Questions to ask a health care provider Do I need to meet with a certified diabetes care and education specialist? Do I need to meet with a dietitian? What number can I call if I have questions? When are the best times to check my blood glucose? Where to find more information: American Diabetes Association: diabetes.org Academy of Nutrition and Dietetics: eatright.[employer] of Diabetes and Digestive and Kidney Diseases: StageSync.si Association of Diabetes Care & Education Specialists: diabeteseducator.org Summary It is important  to have healthy eating habits because your blood sugar (glucose) levels are greatly affected by what you eat and drink. It is important to use alcohol carefully. A healthy meal plan will help you manage your blood glucose and lower your risk of heart disease. Your health care provider may recommend that you work with a dietitian to make a meal plan that is best for you. This information is not intended to replace advice given to you by your health care provider. Make sure you discuss any questions you have with your health care provider. Document Revised: 02/04/2020 Document Reviewed: 02/04/2020 Elsevier Patient Education  2024 [employer].

## 2023-03-22 NOTE — Assessment & Plan Note (Addendum)
Encourage healthy diet and increased activity.  Starting metformin and hopefully this will be beneficial for weight

## 2023-03-22 NOTE — Assessment & Plan Note (Signed)
Uncontrolled but improving. Stopped his medications approximately 6 months ago. Restarted amlodipine 5mg .  Consider restarting hydrochlorothiazide. He is hesitant to restart several new medications at one time so we are adding back slowly.

## 2023-03-22 NOTE — Assessment & Plan Note (Signed)
Stopped cholesterol medications 7 months ago. Most recently prescribed Zetia and Lovaza. LDL 187.  Discussed importance of treating cholesterol especially now that he has a new diagnosis of diabetes.  We discussed potential long-term health consequences such as heart disease, heart attack and stroke.  We discussed eating a low-fat diet and restarting Zetia in the next 4 weeks.  We should also restart Lovaza if he is tolerating Zetia.

## 2023-04-12 ENCOUNTER — Ambulatory Visit: Payer: 59 | Admitting: Family Medicine

## 2023-04-16 ENCOUNTER — Other Ambulatory Visit (HOSPITAL_COMMUNITY): Payer: Self-pay

## 2023-04-18 ENCOUNTER — Ambulatory Visit: Payer: 59 | Admitting: Family Medicine

## 2023-04-18 ENCOUNTER — Other Ambulatory Visit (HOSPITAL_COMMUNITY): Payer: Self-pay

## 2023-04-18 ENCOUNTER — Encounter: Payer: Self-pay | Admitting: Family Medicine

## 2023-04-18 VITALS — BP 122/86 | HR 86 | Temp 97.8°F | Ht 69.0 in | Wt 270.0 lb

## 2023-04-18 DIAGNOSIS — R058 Other specified cough: Secondary | ICD-10-CM | POA: Diagnosis not present

## 2023-04-18 DIAGNOSIS — J0140 Acute pansinusitis, unspecified: Secondary | ICD-10-CM | POA: Diagnosis not present

## 2023-04-18 MED ORDER — AMOXICILLIN-POT CLAVULANATE 875-125 MG PO TABS
1.0000 | ORAL_TABLET | Freq: Two times a day (BID) | ORAL | 0 refills | Status: DC
Start: 2023-04-18 — End: 2023-06-04
  Filled 2023-04-18: qty 20, 10d supply, fill #0

## 2023-04-18 MED ORDER — LEVOCETIRIZINE DIHYDROCHLORIDE 5 MG PO TABS
5.0000 mg | ORAL_TABLET | Freq: Every evening | ORAL | 1 refills | Status: DC
  Filled 2023-04-18: qty 30, 30d supply, fill #0
  Filled 2023-09-21: qty 30, 30d supply, fill #1

## 2023-04-18 NOTE — Progress Notes (Signed)
Subjective:  Jim Hunter is a 57 y.o. male who presents for a 7-8 day hx of sinus pain, frontal headache, post nasal drainage, ST, cough with thick purulent sputum.   Denies fever, chills, dizziness, chest pain, palpitations, shortness of breath, abdominal pain, N/V/D.    ROS as in subjective.   Objective: Vitals:   04/18/23 1041  BP: 122/86  Pulse: 86  Temp: 97.8 F (36.6 C)  SpO2: 96%    General appearance: Alert, WD/WN, no distress, mildly ill appearing                             Skin: warm, no rash                           Head: + frontal sinus tenderness                            Eyes: conjunctiva normal, corneas clear, PERRLA                            Ears: pearly TMs, external ear canals normal                          Nose: septum midline, turbinates swollen, with erythema and clear discharge             Mouth/throat: MMM, tongue normal, mild pharyngeal erythema                           Neck: supple, no adenopathy, no thyromegaly, nontender                          Heart: RRR                         Lungs: CTA bilaterally, no wheezes, rales, or rhonchi      Assessment: Acute pansinusitis, recurrence not specified - Plan: amoxicillin-clavulanate (AUGMENTIN) 875-125 MG tablet  Productive cough   Plan: Augmentin and Xyzal prescribed.  Suggested symptomatic OTC remedies. Nasal saline spray for congestion.  Tylenol or Ibuprofen OTC for fever and malaise.   Follow up prn

## 2023-04-18 NOTE — Patient Instructions (Signed)
Take the antibiotic as prescribed with food.  Stay well-hydrated.  Try Xyzal as prescribed once daily for allergies.  You can get this over-the-counter if it is cheaper that way.  Continue your multisymptom cold medication.  Follow-up if you are getting worse or not back to baseline when you complete the antibiotic.

## 2023-05-31 ENCOUNTER — Ambulatory Visit: Payer: 59 | Admitting: Family Medicine

## 2023-06-04 ENCOUNTER — Ambulatory Visit (INDEPENDENT_AMBULATORY_CARE_PROVIDER_SITE_OTHER): Payer: 59 | Admitting: Family Medicine

## 2023-06-04 ENCOUNTER — Encounter: Payer: Self-pay | Admitting: Family Medicine

## 2023-06-04 ENCOUNTER — Other Ambulatory Visit (HOSPITAL_COMMUNITY): Payer: Self-pay

## 2023-06-04 VITALS — BP 126/86 | HR 88 | Temp 98.0°F | Ht 69.0 in | Wt 281.0 lb

## 2023-06-04 DIAGNOSIS — Z7984 Long term (current) use of oral hypoglycemic drugs: Secondary | ICD-10-CM

## 2023-06-04 DIAGNOSIS — G8929 Other chronic pain: Secondary | ICD-10-CM

## 2023-06-04 DIAGNOSIS — M25512 Pain in left shoulder: Secondary | ICD-10-CM

## 2023-06-04 DIAGNOSIS — Z9889 Other specified postprocedural states: Secondary | ICD-10-CM

## 2023-06-04 DIAGNOSIS — E782 Mixed hyperlipidemia: Secondary | ICD-10-CM

## 2023-06-04 DIAGNOSIS — E119 Type 2 diabetes mellitus without complications: Secondary | ICD-10-CM | POA: Diagnosis not present

## 2023-06-04 MED ORDER — TIRZEPATIDE 2.5 MG/0.5ML ~~LOC~~ SOAJ
2.5000 mg | SUBCUTANEOUS | 1 refills | Status: DC
  Filled 2023-06-04: qty 2, 28d supply, fill #0
  Filled 2023-07-01: qty 2, 28d supply, fill #1

## 2023-06-04 NOTE — Progress Notes (Signed)
Subjective:     Patient ID: Jim Hunter, male    DOB: 07-24-1965, 57 y.o.   MRN: [id number]  Chief Complaint  Patient presents with   Referral    Wants referral for ortho, shoulder pain    HPI   History of Present Illness          C/o left shoulder pain with hx of same. 4 - 5 months with worsening intensity. No recent injury.  Surgery by Dr. Cedric Fishman with [facility] 15 years ago.  No numbness or tingling. Trouble lifting his lunch box or other items due to anterior shoulder pain. Popping occasionally.  Pain is dull at rest.  Trouble sleeping    States he is only taking amlodipine and metformin  Would consider starting a GLP-1 and Zetia.      Health Maintenance Due  Topic Date Due   FOOT EXAM  Never done   OPHTHALMOLOGY EXAM  Never done   Diabetic kidney evaluation - Urine ACR  Never done   Zoster Vaccines- Shingrix (1 of 2) Never done   DTaP/Tdap/Td (2 - Td or Tdap) 10/03/2022    Past Medical History:  Diagnosis Date   Arthritis    GERD (gastroesophageal reflux disease)    Hx of adenomatous polyp of colon 02/03/2019   Hyperlipidemia    Hypertension    PPD screening test last 9 years ago first started as child   treated as child for positive ppd test   Sleep apnea 3 years ago   no borderline no cpap needed per sleep studty done   Tear of right rotator cuff 04/27/2016   DOI: 03-14-2016-lifting 20-25 lb tote with saline at shoulder height  MRI (05-02-2016)-full thickness anterior supraspinatus (FI-0/1) tear with   minimal retraction, SLAP degeneration      Past Surgical History:  Procedure Laterality Date   LUMBAR DISC SURGERY  2006   ROTATOR CUFF REPAIR  2010   left   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   Procedure: RIGHT TOTAL KNEE ARTHROPLASTY;  Surgeon: Durene Romans, MD;  Location: [facility];  Service: Orthopedics;  Laterality: Right;   TYMPANOPLASTY     age 57    Family History  Problem Relation Age of Onset   Hypertension Mother    Hypertension  Brother    Hypertension Maternal Grandmother    Hypertension Sister    Colon cancer Neg Hx    Colon polyps Neg Hx    Esophageal cancer Neg Hx    Rectal cancer Neg Hx    Stomach cancer Neg Hx    Diabetes Neg Hx    Kidney disease Neg Hx    Liver disease Neg Hx    Prostate cancer Neg Hx     Social History   Socioeconomic History   Marital status: Divorced    Spouse name: Not on file   Number of children: Not on file   Years of education: Not on file   Highest education level: Not on file  Occupational History   Occupation: Pharmacologist: [employer]  Tobacco Use   Smoking status: Former    Current packs/day: 1.00    Average packs/day: 1 pack/day for 10.0 years (10.0 ttl pk-yrs)    Types: Cigarettes   Smokeless tobacco: Never  Vaping Use   Vaping status: Never Used  Substance and Sexual Activity   Alcohol use: Yes    Comment: Ocasional    Drug use: No   Sexual activity: Yes  Other Topics Concern   Not on file  Social History Narrative   Not on file   Social Determinants of Health   Financial Resource Strain: Not on file  Food Insecurity: Not on file  Transportation Needs: Not on file  Physical Activity: Not on file  Stress: Not on file  Social Connections: Not on file  Intimate Partner Violence: Not on file    Outpatient Medications Prior to Visit  Medication Sig Dispense Refill   amLODipine (NORVASC) 5 MG tablet Take 1 tablet (5 mg total) by mouth daily. 90 tablet 0   fluticasone (FLONASE) 50 MCG/ACT nasal spray Place 2 sprays into both nostrils daily. 16 g 6   levocetirizine (XYZAL) 5 MG tablet Take 1 tablet (5 mg total) by mouth every evening. 30 tablet 1   metFORMIN (GLUCOPHAGE-XR) 500 MG 24 hr tablet Take 1 tablet (500 mg total) by mouth daily with breakfast. 90 tablet 0   omega-3 acid ethyl esters (LOVAZA) 1 g capsule Take 2 capsules (2 g total) by mouth 2 (two) times daily. 120 capsule 11   sildenafil (VIAGRA) 100 MG tablet Take 1  tablet (100 mg total) by mouth daily as needed. 10 tablet 0   amoxicillin-clavulanate (AUGMENTIN) 875-125 MG tablet Take 1 tablet by mouth 2 (two) times daily. 20 tablet 0   hydrochlorothiazide (HYDRODIURIL) 25 MG tablet Take 1 tablet (25 mg total) by mouth daily. 90 tablet 0   Semaglutide-Weight Management (WEGOVY) 0.25 MG/0.5ML SOAJ Inject 0.25 mg into the skin once a week. Continue for four weeks and then increase dosage 2 mL 0   esomeprazole (NEXIUM) 40 MG capsule Take 1 capsule (40 mg total) by mouth daily. (Patient not taking: Reported on 04/18/2023) 90 capsule 2   ezetimibe (ZETIA) 10 MG tablet Take 1 tablet (10 mg total) by mouth daily. (Patient not taking: Reported on 04/18/2023) 90 tablet 0   No facility-administered medications prior to visit.    Allergies  Allergen Reactions   Crestor [Rosuvastatin] Hives   Fenofibrate Other (See Comments)    Muscle aches   Lipitor [Atorvastatin Calcium] Hives   Valsartan     Muscle aches   Lisinopril Cough    Review of Systems  Constitutional:  Negative for chills and fever.  Respiratory:  Negative for shortness of breath.   Cardiovascular:  Negative for chest pain, palpitations and leg swelling.  Gastrointestinal:  Negative for abdominal pain, constipation, diarrhea, nausea and vomiting.  Genitourinary:  Negative for dysuria, frequency and urgency.  Musculoskeletal:  Positive for joint pain. Negative for neck pain.  Neurological:  Negative for dizziness, tingling and focal weakness.       Objective:    Physical Exam Constitutional:      General: He is not in acute distress.    Appearance: He is not ill-appearing.  Eyes:     Extraocular Movements: Extraocular movements intact.     Conjunctiva/sclera: Conjunctivae normal.  Cardiovascular:     Rate and Rhythm: Normal rate.  Pulmonary:     Effort: Pulmonary effort is normal.  Musculoskeletal:     Left shoulder: Tenderness present. Decreased range of motion. Normal strength.  Normal pulse.     Right upper arm: Normal.     Left upper arm: Normal.     Right hand: Normal.     Left hand: Normal.     Cervical back: Normal range of motion and neck supple.     Comments: Negative drop arm. No sign of infection. No laxity.  TTP  anterior and lateral AC joint.  Pain with flexion, internal and external rotation.  Equal grip strength bilaterally   Skin:    General: Skin is warm and dry.  Neurological:     General: No focal deficit present.     Mental Status: He is alert and oriented to person, place, and time.  Psychiatric:        Mood and Affect: Mood normal.        Behavior: Behavior normal.        Thought Content: Thought content normal.      BP 126/86 (BP Location: Left Arm, Patient Position: Sitting, Cuff Size: Large)   Pulse 88   Temp 98 F (36.7 C) (Temporal)   Ht 5\' 9"  (1.753 m)   Wt 281 lb (127.5 kg)   SpO2 96%   BMI 41.50 kg/m  Wt Readings from Last 3 Encounters:  06/04/23 281 lb (127.5 kg)  04/18/23 270 lb (122.5 kg)  03/22/23 274 lb (124.3 kg)       Assessment & Plan:   Problem List Items Addressed This Visit     Chronic left shoulder pain - Primary   Relevant Orders   Ambulatory referral to Orthopedic Surgery   Hyperlipidemia   New onset type 2 diabetes mellitus (HCC)   Relevant Medications   tirzepatide (MOUNJARO) 2.5 MG/0.5ML Pen   Severe obesity (BMI >= 40) (HCC)   Relevant Medications   tirzepatide (MOUNJARO) 2.5 MG/0.5ML Pen   Other Visit Diagnoses     History of shoulder surgery       Relevant Orders   Ambulatory referral to Orthopedic Surgery      Worsening left shoulder pain with no recent injury. Hx of surgery on same shoulder.  LUE is neurovascularly intact.  Discussed using Voltaren gel, Advil prn, ice or heat and referral made to orthopedist. He prefers to see Dr. Charlann Boxer but is open to whomever is in network.   Discussed current medication use. He is reluctant to start any new medications.  Currently only taking  amlodipine for HTN and metformin for DM.  Discussed importance of cholesterol reduction to prevent heart disease, MI, stroke. Recommend restarting Zetia.  Mounjaro prescribed and will consider taking this if affordable.  Follow up next month as scheduled for chronic health conditions.    I have discontinued Alfonso Arroyos ZOXWRU, hydrochlorothiazide, and amoxicillin-clavulanate. I am also having him start on tirzepatide. Additionally, I am having him maintain his fluticasone, omega-3 acid ethyl esters, esomeprazole, ezetimibe, sildenafil, amLODipine, metFORMIN, and levocetirizine.  Meds ordered this encounter  Medications   tirzepatide (MOUNJARO) 2.5 MG/0.5ML Pen    Sig: Inject 2.5 mg into the skin once a week.    Dispense:  2 mL    Refill:  1    Order Specific Question:   Supervising Provider    Answer:   Hillard Danker A [[id number]]

## 2023-06-04 NOTE — Patient Instructions (Signed)
Start Zetia and continue amlodipine and metformin.   I prescribed Mounjaro which is a once weekly injection to help with diabetes, weight and cholesterol.   Use topical Voltaren gel and Advil as needed.   You should hear from Dr. Nilsa Nutting office to schedule. Let me know if you have not heard from them in 2 weeks.   I will see you back for your diabetes visit as scheduled.

## 2023-06-05 ENCOUNTER — Other Ambulatory Visit (HOSPITAL_COMMUNITY): Payer: Self-pay

## 2023-06-11 ENCOUNTER — Other Ambulatory Visit (HOSPITAL_COMMUNITY): Payer: Self-pay

## 2023-06-11 ENCOUNTER — Other Ambulatory Visit: Payer: Self-pay | Admitting: Family Medicine

## 2023-06-11 DIAGNOSIS — I10 Essential (primary) hypertension: Secondary | ICD-10-CM

## 2023-06-11 MED ORDER — AMLODIPINE BESYLATE 5 MG PO TABS
5.0000 mg | ORAL_TABLET | Freq: Every day | ORAL | 0 refills | Status: DC
Start: 2023-06-11 — End: 2023-09-03
  Filled 2023-06-11: qty 90, 90d supply, fill #0

## 2023-06-12 ENCOUNTER — Other Ambulatory Visit (HOSPITAL_COMMUNITY): Payer: Self-pay

## 2023-06-22 ENCOUNTER — Encounter: Payer: Self-pay | Admitting: Family Medicine

## 2023-06-22 ENCOUNTER — Ambulatory Visit (INDEPENDENT_AMBULATORY_CARE_PROVIDER_SITE_OTHER): Payer: 59 | Admitting: Family Medicine

## 2023-06-22 VITALS — BP 126/84 | HR 80 | Temp 97.6°F | Ht 69.0 in | Wt 270.0 lb

## 2023-06-22 DIAGNOSIS — Z7985 Long-term (current) use of injectable non-insulin antidiabetic drugs: Secondary | ICD-10-CM

## 2023-06-22 DIAGNOSIS — E669 Obesity, unspecified: Secondary | ICD-10-CM | POA: Diagnosis not present

## 2023-06-22 DIAGNOSIS — I10 Essential (primary) hypertension: Secondary | ICD-10-CM

## 2023-06-22 DIAGNOSIS — E782 Mixed hyperlipidemia: Secondary | ICD-10-CM | POA: Diagnosis not present

## 2023-06-22 DIAGNOSIS — Z860101 Personal history of adenomatous and serrated colon polyps: Secondary | ICD-10-CM

## 2023-06-22 DIAGNOSIS — L918 Other hypertrophic disorders of the skin: Secondary | ICD-10-CM | POA: Insufficient documentation

## 2023-06-22 DIAGNOSIS — E119 Type 2 diabetes mellitus without complications: Secondary | ICD-10-CM

## 2023-06-22 DIAGNOSIS — D229 Melanocytic nevi, unspecified: Secondary | ICD-10-CM

## 2023-06-22 LAB — COMPREHENSIVE METABOLIC PANEL
ALT: 34 U/L (ref 0–53)
AST: 28 U/L (ref 0–37)
Albumin: 4.5 g/dL (ref 3.5–5.2)
Alkaline Phosphatase: 85 U/L (ref 39–117)
BUN: 21 mg/dL (ref 6–23)
CO2: 29 meq/L (ref 19–32)
Calcium: 9.2 mg/dL (ref 8.4–10.5)
Chloride: 104 meq/L (ref 96–112)
Creatinine, Ser: 1.15 mg/dL (ref 0.40–1.50)
GFR: 70.26 mL/min (ref >60.00)
Glucose, Bld: 87 mg/dL (ref 70–99)
Potassium: 4 meq/L (ref 3.5–5.1)
Sodium: 140 meq/L (ref 135–145)
Total Bilirubin: 0.6 mg/dL (ref 0.2–1.2)
Total Protein: 7.6 g/dL (ref 6.0–8.3)

## 2023-06-22 LAB — CBC WITH DIFFERENTIAL/PLATELET
Basophils Absolute: 0 10*3/uL (ref 0.0–0.1)
Basophils Relative: 0.6 % (ref 0.0–3.0)
Eosinophils Absolute: 0.4 10*3/uL (ref 0.0–0.7)
Eosinophils Relative: 6.3 % — ABNORMAL HIGH (ref 0.0–5.0)
HCT: 39.8 % (ref 39.0–52.0)
Hemoglobin: 13.5 g/dL (ref 13.0–17.0)
Lymphocytes Relative: 34.7 % (ref 12.0–46.0)
Lymphs Abs: 2.4 10*3/uL (ref 0.7–4.0)
MCHC: 34 g/dL (ref 30.0–36.0)
MCV: 82.6 fL (ref 78.0–100.0)
Monocytes Absolute: 0.5 10*3/uL (ref 0.1–1.0)
Monocytes Relative: 7.9 % (ref 3.0–12.0)
Neutro Abs: 3.5 10*3/uL (ref 1.4–7.7)
Neutrophils Relative %: 50.5 % (ref 43.0–77.0)
Platelets: 299 10*3/uL (ref 150.0–400.0)
RBC: 4.82 Mil/uL (ref 4.22–5.81)
RDW: 17.1 % — ABNORMAL HIGH (ref 11.5–15.5)
WBC: 6.9 10*3/uL (ref 4.0–10.5)

## 2023-06-22 LAB — MICROALBUMIN / CREATININE URINE RATIO
Creatinine,U: 251.3 mg/dL
Microalb Creat Ratio: 0.5 mg/g (ref 0.0–30.0)
Microalb, Ur: 1.3 mg/dL (ref 0.0–1.9)

## 2023-06-22 LAB — LIPID PANEL
Cholesterol: 204 mg/dL — ABNORMAL HIGH (ref 0–200)
HDL: 32.9 mg/dL — ABNORMAL LOW (ref >39.00)
LDL Cholesterol: 152 mg/dL — ABNORMAL HIGH (ref 0–99)
NonHDL: 170.7
Total CHOL/HDL Ratio: 6
Triglycerides: 93 mg/dL (ref 0.0–149.0)
VLDL: 18.6 mg/dL (ref 0.0–40.0)

## 2023-06-22 LAB — HEMOGLOBIN A1C: Hgb A1c MFr Bld: 6.5 % (ref 4.6–6.5)

## 2023-06-22 NOTE — Progress Notes (Signed)
Subjective:     Patient ID: Jim Hunter, male    DOB: [birth year], 57 y.o.   MRN: [id number]  Chief Complaint  Patient presents with   Diabetes    3 month f/u- not checking bs because he says its not the same as the panel    Diabetes Pertinent negatives for hypoglycemia include no dizziness. Associated symptoms include weight loss. Pertinent negatives for diabetes include no chest pain.     History of Present Illness         He is here for a 31-month follow-up on new onset diabetes type 2. He has not been checking his blood sugars regularly. Reports taking metformin daily. States he started taking Mounjaro 2 weeks ago. Reports improvement in his diet and has noticed he cannot eat as much as he was previously.   HTN- reports taking amlodipine daily  Cough with lisinopril. Myalgias with losartan and valsartan   HLD- He has not started Zetia yet but states he does have the medication at home. Intolerant to statins, he has tried Merchant [employer] and Lipitor.  Reports muscle aches with fenofibrate as well. Last saw cardiologist in 03/2022   He was also referred to Pharm[facility] clinic by his cardiologist for HTN and HLD. He was reportedly not pleased about copays so he did not follow up.    Coronary calcium score 0 in 2023     Health Maintenance Due  Topic Date Due   FOOT EXAM  Never done   OPHTHALMOLOGY EXAM  Never done   Diabetic kidney evaluation - Urine ACR  Never done   Zoster Vaccines- Shingrix (1 of 2) Never done   DTaP/Tdap/Td (2 - Td or Tdap) 10/03/2022    Past Medical History:  Diagnosis Date   Arthritis    GERD (gastroesophageal reflux disease)    Hx of adenomatous polyp of colon 02/03/2019   Hyperlipidemia    Hypertension    PPD screening test last 9 years ago first started as child   treated as child for positive ppd test   Sleep apnea 3 years ago   no borderline no cpap needed per sleep studty done   Tear of right rotator cuff 04/27/2016   DOI: 03-14-2016-lifting  20-25 lb tote with saline at shoulder height  MRI (05-02-2016)-full thickness anterior supraspinatus (FI-0/1) tear with   minimal retraction, SLAP degeneration      Past Surgical History:  Procedure Laterality Date   LUMBAR DISC SURGERY  2006   ROTATOR CUFF REPAIR  2010   left   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   Procedure: RIGHT TOTAL KNEE ARTHROPLASTY;  Surgeon: Durene Romans, MD;  Location: [facility];  Service: Orthopedics;  Laterality: Right;   TYMPANOPLASTY     age 43    Family History  Problem Relation Age of Onset   Hypertension Mother    Hypertension Brother    Hypertension Maternal Grandmother    Hypertension Sister    Colon cancer Neg Hx    Colon polyps Neg Hx    Esophageal cancer Neg Hx    Rectal cancer Neg Hx    Stomach cancer Neg Hx    Diabetes Neg Hx    Kidney disease Neg Hx    Liver disease Neg Hx    Prostate cancer Neg Hx     Social History   Socioeconomic History   Marital status: Divorced    Spouse name: Not on file   Number of children: Not on file   Years of education:  Not on file   Highest education level: Not on file  Occupational History   Occupation: Engineer, production     Employer: [employer]  Tobacco Use   Smoking status: Former    Current packs/day: 1.00    Average packs/day: 1 pack/day for 10.0 years (10.0 ttl pk-yrs)    Types: Cigarettes   Smokeless tobacco: Never  Vaping Use   Vaping status: Never Used  Substance and Sexual Activity   Alcohol use: Yes    Comment: Ocasional    Drug use: No   Sexual activity: Yes  Other Topics Concern   Not on file  Social History Narrative   Not on file   Social Determinants of Health   Financial Resource Strain: Not on file  Food Insecurity: Not on file  Transportation Needs: Not on file  Physical Activity: Not on file  Stress: Not on file  Social Connections: Not on file  Intimate Partner Violence: Not on file    Outpatient Medications Prior to Visit  Medication Sig Dispense  Refill   amLODipine (NORVASC) 5 MG tablet Take 1 tablet (5 mg total) by mouth daily. 90 tablet 0   esomeprazole (NEXIUM) 40 MG capsule Take 1 capsule (40 mg total) by mouth daily. 90 capsule 2   fluticasone (FLONASE) 50 MCG/ACT nasal spray Place 2 sprays into both nostrils daily. 16 g 6   levocetirizine (XYZAL) 5 MG tablet Take 1 tablet (5 mg total) by mouth every evening. 30 tablet 1   metFORMIN (GLUCOPHAGE-XR) 500 MG 24 hr tablet Take 1 tablet (500 mg total) by mouth daily with breakfast. 90 tablet 0   omega-3 acid ethyl esters (LOVAZA) 1 g capsule Take 2 capsules (2 g total) by mouth 2 (two) times daily. 120 capsule 11   sildenafil (VIAGRA) 100 MG tablet Take 1 tablet (100 mg total) by mouth daily as needed. 10 tablet 0   tirzepatide (MOUNJARO) 2.5 MG/0.5ML Pen Inject 2.5 mg into the skin once a week. 2 mL 1   ezetimibe (ZETIA) 10 MG tablet Take 1 tablet (10 mg total) by mouth daily. (Patient not taking: Reported on 04/18/2023) 90 tablet 0   No facility-administered medications prior to visit.    Allergies  Allergen Reactions   Crestor [Rosuvastatin] Hives   Fenofibrate Other (See Comments)    Muscle aches   Lipitor [Atorvastatin Calcium] Hives   Valsartan     Muscle aches   Lisinopril Cough    Review of Systems  Constitutional:  Positive for weight loss. Negative for chills and fever.       Intentional   Respiratory:  Negative for shortness of breath.   Cardiovascular:  Negative for chest pain, palpitations and leg swelling.  Gastrointestinal:  Positive for constipation. Negative for abdominal pain, diarrhea, nausea and vomiting.  Genitourinary:  Negative for dysuria, frequency and urgency.  Musculoskeletal:  Positive for joint pain.  Neurological:  Negative for dizziness and focal weakness.       Objective:    Physical Exam Constitutional:      General: He is not in acute distress.    Appearance: He is not ill-appearing.  Eyes:     Extraocular Movements: Extraocular  movements intact.     Conjunctiva/sclera: Conjunctivae normal.  Cardiovascular:     Rate and Rhythm: Normal rate.  Pulmonary:     Effort: Pulmonary effort is normal.  Musculoskeletal:     Cervical back: Normal range of motion and neck supple.  Skin:    General: Skin  is warm and dry.     Comments: Dark brown and lighter brown raised nevus with irregular shape on right upper shoulder area  Neurological:     General: No focal deficit present.     Mental Status: He is alert and oriented to person, place, and time.  Psychiatric:        Mood and Affect: Mood normal.        Behavior: Behavior normal.        Thought Content: Thought content normal.      BP 126/84 (BP Location: Left Arm, Patient Position: Sitting, Cuff Size: Large)   Pulse 80   Temp 97.6 F (36.4 C) (Temporal)   Ht 5\' 9"  (1.753 m)   Wt 270 lb (122.5 kg)   SpO2 95%   BMI 39.87 kg/m  Wt Readings from Last 3 Encounters:  06/22/23 270 lb (122.5 kg)  06/04/23 281 lb (127.5 kg)  04/18/23 270 lb (122.5 kg)       Assessment & Plan:   Problem List Items Addressed This Visit     Atypical nevus    Most likely SK but referral to dermatology for confirmation. He has requested to see dermatology for multiple skin tags including one on his scrotum.       Relevant Orders   Ambulatory referral to Dermatology   Hx of adenomatous polyp of colon   Hyperlipidemia    Stopped cholesterol medications 10 months ago. Most recently prescribed Zetia and Lovaza. LDL 187.  Discussed importance of treating cholesterol especially now that he has a new diagnosis of diabetes.  We discussed potential long-term health consequences such as heart disease, heart attack and stroke.  We discussed eating a low-fat diet and starting Zetia, he has it at home. We should also restart Lovaza if he is tolerating Zetia. Check fasting lipids today since being on Mounjaro.       Relevant Orders   Lipid panel   Hypertension    Close to goal today.  Continue amlodipine 5mg .  Consider restarting hydrochlorothiazide. Now on Mounjaro and losing weight so I am hopeful this will improve BP. Check renal function.        Relevant Orders   CBC with Differential/Platelet   Comprehensive metabolic panel   Multiple acquired skin tags   Relevant Orders   Ambulatory referral to Dermatology   New onset type 2 diabetes mellitus (HCC) - Primary    Hemoglobin A1c 6.6%.  Doing well on metformin 500 mg XR daily.  Declines monitor to check blood sugars at home.  Counseling on a low sugar, low carbohydrate diet. Declines referral to nutritionist.  [name] 2 weeks ago and doing well. Mild constipation and we discussed Miralax. Check A1c today.  Referral for DM eye exam.       Relevant Orders   Ambulatory referral to Ophthalmology   CBC with Differential/Platelet   Comprehensive metabolic panel   Hemoglobin A1c   Microalbumin / creatinine urine ratio   Obesity (BMI 30-39.9)    Start Mounjaro 2 weeks ago and doing well. He has lost 11 lbs since last month. Keep up the good work.        Declines nutrition referral     I am having [name] maintain his fluticasone, omega-3 acid ethyl esters, esomeprazole, ezetimibe, sildenafil, metFORMIN, levocetirizine, tirzepatide, and amLODipine.  No orders of the defined types were placed in this encounter.

## 2023-06-22 NOTE — Assessment & Plan Note (Signed)
Stopped cholesterol medications 10 months ago. Most recently prescribed Zetia and Lovaza. LDL 187.  Discussed importance of treating cholesterol especially now that he has a new diagnosis of diabetes.  We discussed potential long-term health consequences such as heart disease, heart attack and stroke.  We discussed eating a low-fat diet and starting Zetia, he has it at home. We should also restart Lovaza if he is tolerating Zetia. Check fasting lipids today since being on Mounjaro.

## 2023-06-22 NOTE — Patient Instructions (Addendum)
Please go downstairs for labs and a urine protein test.   Start Zetia and fish oil for your cholesterol.  I have referred you to [facility] and an ophthalmologist for your eye exam.  Both offices should be calling you soon.

## 2023-06-22 NOTE — Assessment & Plan Note (Signed)
Start Mounjaro 2 weeks ago and doing well. He has lost 11 lbs since last month. Keep up the good work.

## 2023-06-22 NOTE — Assessment & Plan Note (Signed)
Most likely Jim Hunter but referral to dermatology for confirmation. He has requested to see dermatology for multiple skin tags including one on his scrotum.

## 2023-06-22 NOTE — Assessment & Plan Note (Signed)
Close to goal today. Continue amlodipine 5mg .  Consider restarting hydrochlorothiazide. Now on Mounjaro and losing weight so I am hopeful this will improve BP. Check renal function.

## 2023-06-22 NOTE — Assessment & Plan Note (Signed)
Hemoglobin A1c 6.6%.  Doing well on metformin 500 mg XR daily.  Declines monitor to check blood sugars at home.  Counseling on a low sugar, low carbohydrate diet. Declines referral to nutritionist.  [name] 2 weeks ago and doing well. Mild constipation and we discussed Miralax. Check A1c today.  Referral for DM eye exam.

## 2023-06-25 ENCOUNTER — Other Ambulatory Visit: Payer: Self-pay | Admitting: Family Medicine

## 2023-06-25 ENCOUNTER — Ambulatory Visit: Payer: Self-pay

## 2023-06-25 DIAGNOSIS — M545 Low back pain, unspecified: Secondary | ICD-10-CM

## 2023-06-25 DIAGNOSIS — M25561 Pain in right knee: Secondary | ICD-10-CM

## 2023-06-29 ENCOUNTER — Other Ambulatory Visit: Payer: Self-pay | Admitting: Nurse Practitioner

## 2023-06-29 ENCOUNTER — Ambulatory Visit: Payer: Self-pay

## 2023-06-29 DIAGNOSIS — M25522 Pain in left elbow: Secondary | ICD-10-CM

## 2023-07-03 ENCOUNTER — Other Ambulatory Visit (HOSPITAL_COMMUNITY): Payer: Self-pay

## 2023-07-03 ENCOUNTER — Other Ambulatory Visit: Payer: Self-pay | Admitting: Family Medicine

## 2023-07-03 DIAGNOSIS — E119 Type 2 diabetes mellitus without complications: Secondary | ICD-10-CM

## 2023-07-03 MED ORDER — METFORMIN HCL ER 500 MG PO TB24
500.0000 mg | ORAL_TABLET | Freq: Every day | ORAL | 0 refills | Status: DC
  Filled 2023-07-03: qty 90, 90d supply, fill #0

## 2023-07-04 ENCOUNTER — Other Ambulatory Visit (HOSPITAL_COMMUNITY): Payer: Self-pay

## 2023-07-25 DIAGNOSIS — M25512 Pain in left shoulder: Secondary | ICD-10-CM | POA: Diagnosis not present

## 2023-08-01 DIAGNOSIS — H04123 Dry eye syndrome of bilateral lacrimal glands: Secondary | ICD-10-CM | POA: Diagnosis not present

## 2023-08-01 DIAGNOSIS — H524 Presbyopia: Secondary | ICD-10-CM | POA: Diagnosis not present

## 2023-08-01 DIAGNOSIS — Z135 Encounter for screening for eye and ear disorders: Secondary | ICD-10-CM | POA: Diagnosis not present

## 2023-08-01 DIAGNOSIS — H40013 Open angle with borderline findings, low risk, bilateral: Secondary | ICD-10-CM | POA: Diagnosis not present

## 2023-08-01 LAB — HM DIABETES EYE EXAM

## 2023-08-06 ENCOUNTER — Encounter: Payer: Self-pay | Admitting: Family Medicine

## 2023-08-08 ENCOUNTER — Encounter: Payer: Self-pay | Admitting: Family Medicine

## 2023-08-09 ENCOUNTER — Other Ambulatory Visit (HOSPITAL_COMMUNITY): Payer: Self-pay

## 2023-08-09 ENCOUNTER — Other Ambulatory Visit: Payer: Self-pay | Admitting: Family Medicine

## 2023-08-09 DIAGNOSIS — E119 Type 2 diabetes mellitus without complications: Secondary | ICD-10-CM

## 2023-08-09 MED ORDER — MOUNJARO 2.5 MG/0.5ML ~~LOC~~ SOAJ
2.5000 mg | SUBCUTANEOUS | 1 refills | Status: DC
  Filled 2023-08-09: qty 2, 28d supply, fill #0
  Filled 2023-09-05: qty 2, 28d supply, fill #1

## 2023-08-10 ENCOUNTER — Other Ambulatory Visit (HOSPITAL_COMMUNITY): Payer: Self-pay

## 2023-09-03 ENCOUNTER — Other Ambulatory Visit (HOSPITAL_COMMUNITY): Payer: Self-pay

## 2023-09-03 ENCOUNTER — Other Ambulatory Visit: Payer: Self-pay | Admitting: Family Medicine

## 2023-09-03 DIAGNOSIS — I10 Essential (primary) hypertension: Secondary | ICD-10-CM

## 2023-09-03 MED ORDER — AMLODIPINE BESYLATE 5 MG PO TABS
5.0000 mg | ORAL_TABLET | Freq: Every day | ORAL | 1 refills | Status: DC
  Filled 2023-09-03: qty 90, 90d supply, fill #0
  Filled 2023-12-06: qty 90, 90d supply, fill #1

## 2023-09-05 ENCOUNTER — Other Ambulatory Visit (HOSPITAL_COMMUNITY): Payer: Self-pay

## 2023-09-21 ENCOUNTER — Other Ambulatory Visit: Payer: Self-pay | Admitting: Internal Medicine

## 2023-09-21 ENCOUNTER — Other Ambulatory Visit: Payer: Self-pay | Admitting: Family Medicine

## 2023-09-21 DIAGNOSIS — E119 Type 2 diabetes mellitus without complications: Secondary | ICD-10-CM

## 2023-09-24 ENCOUNTER — Other Ambulatory Visit: Payer: Self-pay

## 2023-09-24 ENCOUNTER — Other Ambulatory Visit (HOSPITAL_COMMUNITY): Payer: Self-pay

## 2023-09-24 MED ORDER — METFORMIN HCL ER 500 MG PO TB24
500.0000 mg | ORAL_TABLET | Freq: Every day | ORAL | 0 refills | Status: DC
  Filled 2023-09-24: qty 90, 90d supply, fill #0

## 2023-09-24 MED ORDER — EZETIMIBE 10 MG PO TABS
10.0000 mg | ORAL_TABLET | Freq: Every day | ORAL | 0 refills | Status: DC
  Filled 2023-09-24: qty 30, 30d supply, fill #0

## 2023-09-24 MED ORDER — MOUNJARO 2.5 MG/0.5ML ~~LOC~~ SOAJ
2.5000 mg | SUBCUTANEOUS | 1 refills | Status: DC
  Filled 2023-09-24 – 2023-09-28 (×2): qty 2, 28d supply, fill #0
  Filled 2023-10-24: qty 2, 28d supply, fill #1

## 2023-09-28 ENCOUNTER — Other Ambulatory Visit: Payer: Self-pay | Admitting: Family Medicine

## 2023-09-28 ENCOUNTER — Other Ambulatory Visit (HOSPITAL_COMMUNITY): Payer: Self-pay

## 2023-09-28 MED ORDER — LEVOCETIRIZINE DIHYDROCHLORIDE 5 MG PO TABS
5.0000 mg | ORAL_TABLET | Freq: Every evening | ORAL | 1 refills | Status: AC
  Filled 2023-09-28 – 2023-10-24 (×2): qty 90, 90d supply, fill #0
  Filled 2024-01-21: qty 90, 90d supply, fill #1

## 2023-10-24 ENCOUNTER — Other Ambulatory Visit (HOSPITAL_COMMUNITY): Payer: Self-pay

## 2023-10-24 ENCOUNTER — Other Ambulatory Visit: Payer: Self-pay | Admitting: Family Medicine

## 2023-10-24 NOTE — Telephone Encounter (Signed)
Cardiology refills this

## 2023-10-26 ENCOUNTER — Other Ambulatory Visit (HOSPITAL_COMMUNITY): Payer: Self-pay

## 2023-10-26 ENCOUNTER — Encounter: Payer: Self-pay | Admitting: Family Medicine

## 2023-10-26 ENCOUNTER — Ambulatory Visit: Payer: 59 | Admitting: Family Medicine

## 2023-10-26 VITALS — BP 122/84 | HR 82 | Temp 98.3°F | Ht 69.0 in | Wt 247.0 lb

## 2023-10-26 DIAGNOSIS — E669 Obesity, unspecified: Secondary | ICD-10-CM | POA: Diagnosis not present

## 2023-10-26 DIAGNOSIS — E119 Type 2 diabetes mellitus without complications: Secondary | ICD-10-CM | POA: Diagnosis not present

## 2023-10-26 DIAGNOSIS — Z7985 Long-term (current) use of injectable non-insulin antidiabetic drugs: Secondary | ICD-10-CM | POA: Diagnosis not present

## 2023-10-26 DIAGNOSIS — Z23 Encounter for immunization: Secondary | ICD-10-CM

## 2023-10-26 DIAGNOSIS — R634 Abnormal weight loss: Secondary | ICD-10-CM | POA: Diagnosis not present

## 2023-10-26 DIAGNOSIS — E782 Mixed hyperlipidemia: Secondary | ICD-10-CM

## 2023-10-26 DIAGNOSIS — Z6836 Body mass index (BMI) 36.0-36.9, adult: Secondary | ICD-10-CM

## 2023-10-26 DIAGNOSIS — I10 Essential (primary) hypertension: Secondary | ICD-10-CM

## 2023-10-26 LAB — HEMOGLOBIN A1C: Hgb A1c MFr Bld: 5.8 % (ref 4.6–6.5)

## 2023-10-26 LAB — CBC WITH DIFFERENTIAL/PLATELET
Basophils Absolute: 0 10*3/uL (ref 0.0–0.1)
Basophils Relative: 0.6 % (ref 0.0–3.0)
Eosinophils Absolute: 0.3 10*3/uL (ref 0.0–0.7)
Eosinophils Relative: 4 % (ref 0.0–5.0)
HCT: 36.5 % — ABNORMAL LOW (ref 39.0–52.0)
Hemoglobin: 12.3 g/dL — ABNORMAL LOW (ref 13.0–17.0)
Lymphocytes Relative: 33.4 % (ref 12.0–46.0)
Lymphs Abs: 2.4 10*3/uL (ref 0.7–4.0)
MCHC: 33.7 g/dL (ref 30.0–36.0)
MCV: 81.4 fl (ref 78.0–100.0)
Monocytes Absolute: 0.5 10*3/uL (ref 0.1–1.0)
Monocytes Relative: 7.4 % (ref 3.0–12.0)
Neutro Abs: 4 10*3/uL (ref 1.4–7.7)
Neutrophils Relative %: 54.6 % (ref 43.0–77.0)
Platelets: 300 10*3/uL (ref 150.0–400.0)
RBC: 4.49 Mil/uL (ref 4.22–5.81)
RDW: 15.9 % — ABNORMAL HIGH (ref 11.5–15.5)
WBC: 7.3 10*3/uL (ref 4.0–10.5)

## 2023-10-26 LAB — COMPREHENSIVE METABOLIC PANEL WITH GFR
ALT: 21 U/L (ref 0–53)
AST: 22 U/L (ref 0–37)
Albumin: 4.3 g/dL (ref 3.5–5.2)
Alkaline Phosphatase: 69 U/L (ref 39–117)
BUN: 15 mg/dL (ref 6–23)
CO2: 27 meq/L (ref 19–32)
Calcium: 8.8 mg/dL (ref 8.4–10.5)
Chloride: 104 meq/L (ref 96–112)
Creatinine, Ser: 1.02 mg/dL (ref 0.40–1.50)
GFR: 80.95 mL/min (ref >60.00)
Glucose, Bld: 84 mg/dL (ref 70–99)
Potassium: 3.5 meq/L (ref 3.5–5.1)
Sodium: 138 meq/L (ref 135–145)
Total Bilirubin: 0.6 mg/dL (ref 0.2–1.2)
Total Protein: 6.9 g/dL (ref 6.0–8.3)

## 2023-10-26 LAB — MICROALBUMIN / CREATININE URINE RATIO
Creatinine,U: 61.4 mg/dL
Microalb Creat Ratio: UNDETERMINED mg/g (ref 0.0–30.0)
Microalb, Ur: <0.7 mg/dL

## 2023-10-26 LAB — LIPID PANEL
Cholesterol: 182 mg/dL (ref 0–200)
HDL: 39 mg/dL — ABNORMAL LOW (ref >39.00)
LDL Cholesterol: 119 mg/dL — ABNORMAL HIGH (ref 0–99)
NonHDL: 143.18
Total CHOL/HDL Ratio: 5
Triglycerides: 119 mg/dL (ref 0.0–149.0)
VLDL: 23.8 mg/dL (ref 0.0–40.0)

## 2023-10-26 LAB — VITAMIN B12: Vitamin B-12: 236 pg/mL (ref 211–911)

## 2023-10-26 LAB — TSH: TSH: 1.56 u[IU]/mL (ref 0.35–5.50)

## 2023-10-26 MED ORDER — TIRZEPATIDE 5 MG/0.5ML ~~LOC~~ SOAJ
5.0000 mg | SUBCUTANEOUS | 0 refills | Status: DC
  Filled 2023-10-26: qty 6, 84d supply, fill #0
  Filled 2023-11-22: qty 2, 28d supply, fill #0
  Filled 2023-12-14 – 2023-12-17 (×2): qty 2, 28d supply, fill #1
  Filled 2024-01-11: qty 2, 28d supply, fill #2

## 2023-10-26 NOTE — Assessment & Plan Note (Signed)
 He has lost 23 pounds since being on Mounjaro.  He is eating a healthier diet and increasing activity.  Recommend he continue with his low-carb, low-fat diet.  Increase Mounjaro to 5 mg weekly since cravings are returning on the 2.5 mg dose.

## 2023-10-26 NOTE — Progress Notes (Signed)
 Subjective:     Patient ID: Jim Hunter, male    DOB: [birth year], 58 y.o.   MRN: [id number]  Chief Complaint  Patient presents with   Medical Management of Chronic Issues    4 months f/u    HPI  History of Present Illness         He is here to follow-up on chronic health conditions.  HLD- We started Zetia 4 months ago.   HTN- reports taking amlodipine   DM- taking metformin and Mounjaro 2.5 mg weekly    He has lost 23 lbs in the past 4 months.   Eating a healthier diet and eating less. Reduced alcohol and carbohydrates. Feeling better.   States he does not get out of breath going up the stairs now.   Umbilical hernia that he can decompress easily and prefers to hold off on surgical consult     Health Maintenance Due  Topic Date Due   Zoster Vaccines- Shingrix (1 of 2) Never done   DTaP/Tdap/Td (2 - Td or Tdap) 10/03/2022    Past Medical History:  Diagnosis Date   Arthritis    GERD (gastroesophageal reflux disease)    Hx of adenomatous polyp of colon 02/03/2019   Hyperlipidemia    Hypertension    PPD screening test last 9 years ago first started as child   treated as child for positive ppd test   Sleep apnea 3 years ago   no borderline no cpap needed per sleep studty done   Tear of right rotator cuff 04/27/2016   DOI: 03-14-2016-lifting 20-25 lb tote with saline at shoulder height  MRI (05-02-2016)-full thickness anterior supraspinatus (FI-0/1) tear with   minimal retraction, SLAP degeneration      Past Surgical History:  Procedure Laterality Date   LUMBAR DISC SURGERY  2006   ROTATOR CUFF REPAIR  2010   left   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   Procedure: RIGHT TOTAL KNEE ARTHROPLASTY;  Surgeon: Durene Romans, MD;  Location: [facility];  Service: Orthopedics;  Laterality: Right;   TYMPANOPLASTY     age 66    Family History  Problem Relation Age of Onset   Hypertension Mother    Hypertension Brother    Hypertension Maternal Grandmother     Hypertension Sister    Colon cancer Neg Hx    Colon polyps Neg Hx    Esophageal cancer Neg Hx    Rectal cancer Neg Hx    Stomach cancer Neg Hx    Diabetes Neg Hx    Kidney disease Neg Hx    Liver disease Neg Hx    Prostate cancer Neg Hx     Social History   Socioeconomic History   Marital status: Divorced    Spouse name: Not on file   Number of children: Not on file   Years of education: Not on file   Highest education level: Not on file  Occupational History   Occupation: Pharmacologist: [employer]  Tobacco Use   Smoking status: Former    Current packs/day: 1.00    Average packs/day: 1 pack/day for 10.0 years (10.0 ttl pk-yrs)    Types: Cigarettes   Smokeless tobacco: Never  Vaping Use   Vaping status: Never Used  Substance and Sexual Activity   Alcohol use: Yes    Comment: Ocasional    Drug use: No   Sexual activity: Yes  Other Topics Concern   Not on file  Social  History Narrative   Not on file   Social Drivers of Health   Financial Resource Strain: Not on file  Food Insecurity: Not on file  Transportation Needs: Not on file  Physical Activity: Not on file  Stress: Not on file  Social Connections: Not on file  Intimate Partner Violence: Not on file    Outpatient Medications Prior to Visit  Medication Sig Dispense Refill   amLODipine (NORVASC) 5 MG tablet Take 1 tablet (5 mg total) by mouth daily. 90 tablet 1   esomeprazole (NEXIUM) 40 MG capsule Take 1 capsule (40 mg total) by mouth daily. 90 capsule 2   ezetimibe (ZETIA) 10 MG tablet Take 1 tablet (10 mg total) by mouth daily. 30 tablet 0   fluticasone (FLONASE) 50 MCG/ACT nasal spray Place 2 sprays into both nostrils daily. 16 g 6   levocetirizine (XYZAL) 5 MG tablet Take 1 tablet (5 mg total) by mouth every evening. 90 tablet 1   metFORMIN (GLUCOPHAGE-XR) 500 MG 24 hr tablet Take 1 tablet (500 mg total) by mouth daily with breakfast. 90 tablet 0   omega-3 acid ethyl esters (LOVAZA)  1 g capsule Take 2 capsules (2 g total) by mouth 2 (two) times daily. 120 capsule 11   sildenafil (VIAGRA) 100 MG tablet Take 1 tablet (100 mg total) by mouth daily as needed. 10 tablet 0   tirzepatide (MOUNJARO) 2.5 MG/0.5ML Pen Inject 2.5 mg into the skin once a week. 2 mL 1   No facility-administered medications prior to visit.    Allergies  Allergen Reactions   Crestor [Rosuvastatin] Hives   Fenofibrate Other (See Comments)    Muscle aches   Lipitor [Atorvastatin Calcium] Hives   Valsartan     Muscle aches   Lisinopril Cough    Review of Systems  Constitutional:  Positive for weight loss. Negative for chills, fever and malaise/fatigue.       Intentional  Eyes:  Negative for double vision.  Respiratory:  Negative for shortness of breath.   Cardiovascular:  Negative for chest pain, palpitations, orthopnea and leg swelling.  Gastrointestinal:  Negative for abdominal pain, constipation, diarrhea, nausea and vomiting.  Genitourinary:  Negative for dysuria, frequency and urgency.  Neurological:  Negative for dizziness and focal weakness.  Endo/Heme/Allergies:  Negative for polydipsia.  Psychiatric/Behavioral:  Negative for depression. The patient is not nervous/anxious.        Objective:    Physical Exam Constitutional:      General: He is not in acute distress.    Appearance: He is not ill-appearing.  HENT:     Mouth/Throat:     Mouth: Mucous membranes are moist.  Eyes:     Extraocular Movements: Extraocular movements intact.     Conjunctiva/sclera: Conjunctivae normal.  Cardiovascular:     Rate and Rhythm: Normal rate and regular rhythm.  Pulmonary:     Effort: Pulmonary effort is normal.     Breath sounds: Normal breath sounds.  Musculoskeletal:     Cervical back: Normal range of motion and neck supple.     Right lower leg: No edema.     Left lower leg: No edema.  Skin:    General: Skin is warm and dry.  Neurological:     General: No focal deficit present.      Mental Status: He is alert and oriented to person, place, and time.     Motor: No weakness.     Coordination: Coordination normal.     Gait: Gait normal.  Psychiatric:        Mood and Affect: Mood normal.        Behavior: Behavior normal.        Thought Content: Thought content normal.      BP 122/84 (BP Location: Left Arm, Patient Position: Sitting, Cuff Size: Normal)   Pulse 82   Temp 98.3 F (36.8 C) (Temporal)   Ht 5\' 9"  (1.753 m)   Wt 247 lb (112 kg)   SpO2 96%   BMI 36.48 kg/m  Wt Readings from Last 3 Encounters:  10/26/23 247 lb (112 kg)  06/22/23 270 lb (122.5 kg)  06/04/23 281 lb (127.5 kg)       Assessment & Plan:   Problem List Items Addressed This Visit     Hyperlipidemia - Primary     Reports taking Zetia daily without any issues.  He has lost a significant amount of weight on Mounjaro.  Diet healthier in general. Check fasting lipids and follow-up      Relevant Orders   Lipid panel   Hypertension   At goal today. Continue amlodipine 5mg .  Consider restarting hydrochlorothiazide. Now on Mounjaro and losing weight so I am hopeful this will improve BP. Check renal function.        Relevant Orders   CBC with Differential/Platelet   Comprehensive metabolic panel with GFR   TSH   Obesity (BMI 30-39.9)   He has lost 23 pounds since being on Mounjaro.  He is eating a healthier diet and increasing activity.  Recommend he continue with his low-carb, low-fat diet.  Increase Mounjaro to 5 mg weekly since cravings are returning on the 2.5 mg dose.      Relevant Medications   tirzepatide (MOUNJARO) 5 MG/0.5ML Pen   Other Relevant Orders   CBC with Differential/Platelet   Comprehensive metabolic panel with GFR   Hemoglobin A1c   Lipid panel   TSH   Type 2 diabetes mellitus without complication, without long-term current use of insulin (HCC)   Reports good compliance with Mounjaro 2.5 mg weekly and metformin.  Asymptomatic.  Significant weight loss which  should benefit his diabetes and overall health.  Increase Mounjaro to 5 mg weekly since cravings are returning.  Check A1c and renal function.  Recommend annual eye exam. On Zetia for HLD, does not tolerate statins On amlodipine for hypertension, did not tolerate lisinopril. Check urine microalbumin      Relevant Medications   tirzepatide (MOUNJARO) 5 MG/0.5ML Pen   Other Relevant Orders   CBC with Differential/Platelet   Comprehensive metabolic panel with GFR   Hemoglobin A1c   Vitamin B12   TSH   Microalbumin / creatinine urine ratio   Other Visit Diagnoses       Weight loss       Relevant Orders   TSH     Need for pneumococcal 20-valent conjugate vaccination       Relevant Orders   Pneumococcal conjugate vaccine 20-valent (Prevnar 20) (Completed)     Immunization due           I have discontinued [employer]. I am also having him start on tirzepatide. Additionally, I am having him maintain his fluticasone, omega-3 acid ethyl esters, esomeprazole, sildenafil, amLODipine, metFORMIN, ezetimibe, and levocetirizine.  Meds ordered this encounter  Medications   tirzepatide (MOUNJARO) 5 MG/0.5ML Pen    Sig: Inject 5 mg into the skin once a week.    Dispense:  6 mL    Refill:  0  Supervising Provider:   Hillard Danker A [[id number]]

## 2023-10-26 NOTE — Patient Instructions (Signed)
 Keep up the good work.  I have increased your Mounjaro dose to 5 mg weekly.  We will be in touch with your results from today.

## 2023-10-26 NOTE — Assessment & Plan Note (Signed)
 Reports good compliance with Mounjaro 2.5 mg weekly and metformin.  Asymptomatic.  Significant weight loss which should benefit his diabetes and overall health.  Increase Mounjaro to 5 mg weekly since cravings are returning.  Check A1c and renal function.  Recommend annual eye exam. On Zetia for HLD, does not tolerate statins On amlodipine for hypertension, did not tolerate lisinopril. Check urine microalbumin

## 2023-10-26 NOTE — Assessment & Plan Note (Signed)
 At goal today. Continue amlodipine 5mg .  Consider restarting hydrochlorothiazide. Now on Mounjaro and losing weight so I am hopeful this will improve BP. Check renal function.

## 2023-10-26 NOTE — Assessment & Plan Note (Addendum)
 Reports taking Zetia daily without any issues.  He has lost a significant amount of weight on Mounjaro.  Diet healthier in general. Check fasting lipids and follow-up

## 2023-10-28 ENCOUNTER — Encounter: Payer: Self-pay | Admitting: Family Medicine

## 2023-10-31 ENCOUNTER — Other Ambulatory Visit (HOSPITAL_COMMUNITY): Payer: Self-pay

## 2023-11-22 ENCOUNTER — Other Ambulatory Visit (HOSPITAL_COMMUNITY): Payer: Self-pay

## 2023-11-22 ENCOUNTER — Other Ambulatory Visit: Payer: Self-pay | Admitting: Family Medicine

## 2023-11-23 ENCOUNTER — Other Ambulatory Visit (HOSPITAL_COMMUNITY): Payer: Self-pay

## 2023-11-26 ENCOUNTER — Other Ambulatory Visit (HOSPITAL_COMMUNITY): Payer: Self-pay

## 2023-11-26 MED ORDER — EZETIMIBE 10 MG PO TABS
10.0000 mg | ORAL_TABLET | Freq: Every day | ORAL | 1 refills | Status: DC
  Filled 2023-11-26: qty 90, 90d supply, fill #0
  Filled 2024-02-27: qty 90, 90d supply, fill #1

## 2023-11-29 ENCOUNTER — Other Ambulatory Visit (HOSPITAL_COMMUNITY): Payer: Self-pay

## 2023-11-29 ENCOUNTER — Other Ambulatory Visit: Payer: Self-pay | Admitting: Family Medicine

## 2023-11-29 DIAGNOSIS — K219 Gastro-esophageal reflux disease without esophagitis: Secondary | ICD-10-CM

## 2023-11-29 MED ORDER — ESOMEPRAZOLE MAGNESIUM 40 MG PO CPDR
40.0000 mg | DELAYED_RELEASE_CAPSULE | Freq: Every day | ORAL | 1 refills | Status: DC
  Filled 2023-11-29: qty 90, 90d supply, fill #0
  Filled 2024-02-28: qty 90, 90d supply, fill #1

## 2023-12-05 ENCOUNTER — Other Ambulatory Visit: Payer: Self-pay | Admitting: Family Medicine

## 2023-12-05 ENCOUNTER — Other Ambulatory Visit (HOSPITAL_COMMUNITY): Payer: Self-pay

## 2023-12-05 DIAGNOSIS — E119 Type 2 diabetes mellitus without complications: Secondary | ICD-10-CM

## 2023-12-05 MED ORDER — METFORMIN HCL ER 500 MG PO TB24
500.0000 mg | ORAL_TABLET | Freq: Every day | ORAL | 0 refills | Status: DC
  Filled 2023-12-05: qty 90, 90d supply, fill #0

## 2023-12-14 ENCOUNTER — Other Ambulatory Visit (HOSPITAL_COMMUNITY): Payer: Self-pay

## 2024-01-11 ENCOUNTER — Other Ambulatory Visit (HOSPITAL_COMMUNITY): Payer: Self-pay

## 2024-01-21 ENCOUNTER — Other Ambulatory Visit (HOSPITAL_COMMUNITY): Payer: Self-pay

## 2024-02-14 ENCOUNTER — Other Ambulatory Visit: Payer: Self-pay | Admitting: Family Medicine

## 2024-02-14 ENCOUNTER — Encounter (HOSPITAL_COMMUNITY): Payer: Self-pay

## 2024-02-14 ENCOUNTER — Other Ambulatory Visit (HOSPITAL_COMMUNITY): Payer: Self-pay

## 2024-02-14 DIAGNOSIS — E119 Type 2 diabetes mellitus without complications: Secondary | ICD-10-CM

## 2024-02-14 DIAGNOSIS — I10 Essential (primary) hypertension: Secondary | ICD-10-CM

## 2024-02-14 MED ORDER — AMLODIPINE BESYLATE 5 MG PO TABS
5.0000 mg | ORAL_TABLET | Freq: Every day | ORAL | 1 refills | Status: AC
  Filled 2024-02-14 – 2024-02-25 (×2): qty 90, 90d supply, fill #0
  Filled 2024-05-29: qty 90, 90d supply, fill #1

## 2024-02-14 MED ORDER — METFORMIN HCL ER 500 MG PO TB24
500.0000 mg | ORAL_TABLET | Freq: Every day | ORAL | 1 refills | Status: AC
  Filled 2024-02-14 – 2024-02-25 (×2): qty 90, 90d supply, fill #0
  Filled 2024-05-29: qty 90, 90d supply, fill #1

## 2024-02-14 MED ORDER — MOUNJARO 5 MG/0.5ML ~~LOC~~ SOAJ
5.0000 mg | SUBCUTANEOUS | 0 refills | Status: AC
Start: 2024-02-14 — End: ?
  Filled 2024-02-14: qty 6, 84d supply, fill #0

## 2024-02-25 ENCOUNTER — Other Ambulatory Visit (HOSPITAL_COMMUNITY): Payer: Self-pay

## 2024-02-27 ENCOUNTER — Other Ambulatory Visit (HOSPITAL_COMMUNITY): Payer: Self-pay

## 2024-02-28 ENCOUNTER — Other Ambulatory Visit (HOSPITAL_COMMUNITY): Payer: Self-pay

## 2024-02-29 ENCOUNTER — Other Ambulatory Visit (HOSPITAL_COMMUNITY): Payer: Self-pay

## 2024-04-21 ENCOUNTER — Other Ambulatory Visit: Payer: Self-pay

## 2024-05-02 ENCOUNTER — Telehealth: Payer: Self-pay

## 2024-05-02 ENCOUNTER — Other Ambulatory Visit (HOSPITAL_COMMUNITY): Payer: Self-pay

## 2024-05-02 ENCOUNTER — Encounter: Admitting: Family Medicine

## 2024-05-02 DIAGNOSIS — E119 Type 2 diabetes mellitus without complications: Secondary | ICD-10-CM

## 2024-05-02 MED ORDER — TIRZEPATIDE 7.5 MG/0.5ML ~~LOC~~ SOAJ
7.5000 mg | SUBCUTANEOUS | 1 refills | Status: DC
  Filled 2024-05-02: qty 2, 28d supply, fill #0
  Filled 2024-05-29 – 2024-05-30 (×2): qty 2, 28d supply, fill #1

## 2024-05-02 NOTE — Telephone Encounter (Signed)
 Copied from CRM #[id number]. Topic: Clinical - Medication Question >> May 01, 2024  8:34 AM Carlyon D wrote: Reason for CRM: Pt calling in regards tirzepatide  (MOUNJARO ) 5 MG/0.5ML Pen Pharmacy is asking if its time to up the dosage to 7.5 MG  for pt medication. Please reach out to in regards to this or send over new dosage to pharmacy .  Preferred pharmacy:  - [facility] [address], [address] [place] [address] Phone: [phone] Fax: [phone] Hours: M-F 7:30am-7:00p

## 2024-05-02 NOTE — Addendum Note (Signed)
 Addended by: Reece Mcbroom E on: 05/02/2024 01:21 PM   Modules accepted: Orders

## 2024-05-02 NOTE — Telephone Encounter (Signed)
 New 7.5 mg rx sent

## 2024-05-14 ENCOUNTER — Ambulatory Visit: Admitting: Dermatology

## 2024-05-14 ENCOUNTER — Encounter: Payer: Self-pay | Admitting: Dermatology

## 2024-05-14 VITALS — BP 131/85

## 2024-05-14 DIAGNOSIS — L578 Other skin changes due to chronic exposure to nonionizing radiation: Secondary | ICD-10-CM | POA: Diagnosis not present

## 2024-05-14 DIAGNOSIS — D1801 Hemangioma of skin and subcutaneous tissue: Secondary | ICD-10-CM

## 2024-05-14 DIAGNOSIS — Z1283 Encounter for screening for malignant neoplasm of skin: Secondary | ICD-10-CM

## 2024-05-14 DIAGNOSIS — L918 Other hypertrophic disorders of the skin: Secondary | ICD-10-CM

## 2024-05-14 DIAGNOSIS — L814 Other melanin hyperpigmentation: Secondary | ICD-10-CM

## 2024-05-14 DIAGNOSIS — W908XXA Exposure to other nonionizing radiation, initial encounter: Secondary | ICD-10-CM

## 2024-05-14 DIAGNOSIS — D229 Melanocytic nevi, unspecified: Secondary | ICD-10-CM

## 2024-05-14 DIAGNOSIS — L821 Other seborrheic keratosis: Secondary | ICD-10-CM | POA: Diagnosis not present

## 2024-05-14 NOTE — Progress Notes (Signed)
   New Patient Visit   Subjective  Jim Hunter is a 58 y.o. male NEW PATIENT who presents for the following:   Total Body Skin Exam (TBSE)  The patient reports he  has spots, moles and lesions to be evaluated, some may be new or changing and the patient may have concern these could be cancer. (L shoulder)  Patient has not previously been treated by a dermatologist. No Hx of Bx. No family Hx of skin cancers. Patient does not apply sunscreen and/or wears protective coverings.  The following portions of the chart were reviewed this encounter and updated as appropriate: medications, allergies, medical history  Review of Systems:  No other skin or systemic complaints except as noted in HPI or Assessment and Plan.  Objective  Well appearing patient in no apparent distress; mood and affect are within normal limits.  A full examination was performed including scalp, head, eyes, ears, nose, lips, neck, chest, axillae, abdomen, back, buttocks, bilateral upper extremities, bilateral lower extremities, hands, feet, fingers, toes, fingernails, and toenails. All findings within normal limits unless otherwise noted below.    Relevant exam findings are noted in the Assessment and Plan.    Assessment & Plan   LENTIGINES, SEBORRHEIC KERATOSES, HEMANGIOMAS - Benign normal skin lesions - Benign-appearing - Call for any changes  BENIGN MELANOCYTIC NEVI - Tan-brown and/or pink-flesh-colored symmetric macules and papules - Benign appearing on exam today - Observation - Call clinic for new or changing moles - Recommend daily use of broad spectrum spf 30+ sunscreen to sun-exposed areas.   MILD ACTINIC DAMAGE - Chronic condition, secondary to cumulative UV/sun exposure - diffuse scaly erythematous macules with underlying dyspigmentation - Recommend daily broad spectrum sunscreen SPF 30+ to sun-exposed areas, reapply every 2 hours as needed.  - Staying in the shade or wearing long sleeves, sun glasses  (UVA+UVB protection) and wide brim hats (4-inch brim around the entire circumference of the hat) are also recommended for sun protection.  - Call for new or changing lesions.  Acrochordons (Skin Tags) - Fleshy, skin-colored pedunculated papules - Benign appearing.  - Observe. - If desired, they can be removed with an in office procedure that is not covered by insurance. - Please call the clinic if you notice any new or changing lesions.   SKIN CANCER SCREENING PERFORMED TODAY  SKIN EXAM FOR MALIGNANT NEOPLASM   MULTIPLE BENIGN MELANOCYTIC NEVI   CHERRY ANGIOMA   LENTIGINES   SEBORRHEIC KERATOSIS   ACTINIC SKIN DAMAGE    Return in about 2 years (around 05/14/2026) for TBSE.   Documentation: I have reviewed the above documentation for accuracy and completeness, and I agree with the above.  I, Shirron Maranda, CMA, am acting as scribe for [employer], DO.   Delon Lenis, DO

## 2024-05-14 NOTE — Patient Instructions (Signed)

## 2024-05-29 ENCOUNTER — Other Ambulatory Visit: Payer: Self-pay | Admitting: Family Medicine

## 2024-05-29 ENCOUNTER — Other Ambulatory Visit (HOSPITAL_COMMUNITY): Payer: Self-pay

## 2024-05-29 ENCOUNTER — Other Ambulatory Visit: Payer: Self-pay

## 2024-05-29 DIAGNOSIS — K219 Gastro-esophageal reflux disease without esophagitis: Secondary | ICD-10-CM

## 2024-05-29 MED ORDER — ESOMEPRAZOLE MAGNESIUM 40 MG PO CPDR
40.0000 mg | DELAYED_RELEASE_CAPSULE | Freq: Every day | ORAL | 1 refills | Status: AC
  Filled 2024-05-29: qty 90, 90d supply, fill #0

## 2024-05-29 MED ORDER — EZETIMIBE 10 MG PO TABS
10.0000 mg | ORAL_TABLET | Freq: Every day | ORAL | 1 refills | Status: AC
  Filled 2024-05-29: qty 90, 90d supply, fill #0

## 2024-05-30 ENCOUNTER — Other Ambulatory Visit (HOSPITAL_COMMUNITY): Payer: Self-pay

## 2024-06-05 ENCOUNTER — Other Ambulatory Visit (HOSPITAL_COMMUNITY): Payer: Self-pay

## 2024-06-05 ENCOUNTER — Ambulatory Visit (INDEPENDENT_AMBULATORY_CARE_PROVIDER_SITE_OTHER): Admitting: Family Medicine

## 2024-06-05 ENCOUNTER — Encounter: Payer: Self-pay | Admitting: Family Medicine

## 2024-06-05 VITALS — BP 116/82 | HR 82 | Temp 97.8°F | Ht 69.0 in | Wt 236.0 lb

## 2024-06-05 DIAGNOSIS — E119 Type 2 diabetes mellitus without complications: Secondary | ICD-10-CM | POA: Diagnosis not present

## 2024-06-05 DIAGNOSIS — E538 Deficiency of other specified B group vitamins: Secondary | ICD-10-CM | POA: Diagnosis not present

## 2024-06-05 DIAGNOSIS — Z0001 Encounter for general adult medical examination with abnormal findings: Secondary | ICD-10-CM

## 2024-06-05 DIAGNOSIS — Z125 Encounter for screening for malignant neoplasm of prostate: Secondary | ICD-10-CM

## 2024-06-05 DIAGNOSIS — E782 Mixed hyperlipidemia: Secondary | ICD-10-CM | POA: Diagnosis not present

## 2024-06-05 DIAGNOSIS — I10 Essential (primary) hypertension: Secondary | ICD-10-CM

## 2024-06-05 DIAGNOSIS — Z0000 Encounter for general adult medical examination without abnormal findings: Secondary | ICD-10-CM

## 2024-06-05 DIAGNOSIS — I517 Cardiomegaly: Secondary | ICD-10-CM

## 2024-06-05 DIAGNOSIS — G4733 Obstructive sleep apnea (adult) (pediatric): Secondary | ICD-10-CM | POA: Diagnosis not present

## 2024-06-05 DIAGNOSIS — E669 Obesity, unspecified: Secondary | ICD-10-CM

## 2024-06-05 DIAGNOSIS — Z7985 Long-term (current) use of injectable non-insulin antidiabetic drugs: Secondary | ICD-10-CM | POA: Diagnosis not present

## 2024-06-05 DIAGNOSIS — D649 Anemia, unspecified: Secondary | ICD-10-CM

## 2024-06-05 LAB — LIPID PANEL
Cholesterol: 161 mg/dL (ref 0–200)
HDL: 45.1 mg/dL (ref >39.00)
LDL Cholesterol: 106 mg/dL — ABNORMAL HIGH (ref 0–99)
NonHDL: 116.32
Total CHOL/HDL Ratio: 4
Triglycerides: 51 mg/dL (ref 0.0–149.0)
VLDL: 10.2 mg/dL (ref 0.0–40.0)

## 2024-06-05 LAB — COMPREHENSIVE METABOLIC PANEL WITH GFR
ALT: 21 U/L (ref 0–53)
AST: 31 U/L (ref 0–37)
Albumin: 4.4 g/dL (ref 3.5–5.2)
Alkaline Phosphatase: 74 U/L (ref 39–117)
BUN: 20 mg/dL (ref 6–23)
CO2: 26 meq/L (ref 19–32)
Calcium: 9.3 mg/dL (ref 8.4–10.5)
Chloride: 105 meq/L (ref 96–112)
Creatinine, Ser: 0.9 mg/dL (ref 0.40–1.50)
GFR: 93.66 mL/min (ref >60.00)
Glucose, Bld: 81 mg/dL (ref 70–99)
Potassium: 3.7 meq/L (ref 3.5–5.1)
Sodium: 139 meq/L (ref 135–145)
Total Bilirubin: 0.6 mg/dL (ref 0.2–1.2)
Total Protein: 7.2 g/dL (ref 6.0–8.3)

## 2024-06-05 LAB — CBC WITH DIFFERENTIAL/PLATELET
Basophils Absolute: 0 K/uL (ref 0.0–0.1)
Basophils Relative: 0.7 % (ref 0.0–3.0)
Eosinophils Absolute: 0.4 K/uL (ref 0.0–0.7)
Eosinophils Relative: 5.3 % — ABNORMAL HIGH (ref 0.0–5.0)
HCT: 38.5 % — ABNORMAL LOW (ref 39.0–52.0)
Hemoglobin: 12.9 g/dL — ABNORMAL LOW (ref 13.0–17.0)
Lymphocytes Relative: 32.6 % (ref 12.0–46.0)
Lymphs Abs: 2.2 K/uL (ref 0.7–4.0)
MCHC: 33.4 g/dL (ref 30.0–36.0)
MCV: 81.3 fl (ref 78.0–100.0)
Monocytes Absolute: 0.5 K/uL (ref 0.1–1.0)
Monocytes Relative: 7.9 % (ref 3.0–12.0)
Neutro Abs: 3.6 K/uL (ref 1.4–7.7)
Neutrophils Relative %: 53.5 % (ref 43.0–77.0)
Platelets: 271 K/uL (ref 150.0–400.0)
RBC: 4.74 Mil/uL (ref 4.22–5.81)
RDW: 15.6 % — ABNORMAL HIGH (ref 11.5–15.5)
WBC: 6.8 K/uL (ref 4.0–10.5)

## 2024-06-05 LAB — FOLATE: Folate: 17.9 ng/mL (ref >5.9)

## 2024-06-05 LAB — HEMOGLOBIN A1C: Hgb A1c MFr Bld: 5.7 % (ref 4.6–6.5)

## 2024-06-05 LAB — VITAMIN B12: Vitamin B-12: 528 pg/mL (ref 211–911)

## 2024-06-05 LAB — TSH: TSH: 1.96 u[IU]/mL (ref 0.35–5.50)

## 2024-06-05 LAB — FERRITIN: Ferritin: 7.8 ng/mL — ABNORMAL LOW (ref 22.0–322.0)

## 2024-06-05 LAB — PSA: PSA: 0.99 ng/mL (ref 0.10–4.00)

## 2024-06-05 MED ORDER — TIRZEPATIDE 10 MG/0.5ML ~~LOC~~ SOAJ
10.0000 mg | SUBCUTANEOUS | 1 refills | Status: AC
  Filled 2024-06-05: qty 6, 84d supply, fill #0
  Filled 2024-06-25: qty 2, 28d supply, fill #0
  Filled 2024-07-21: qty 2, 28d supply, fill #1
  Filled 2024-08-17: qty 2, 28d supply, fill #2
  Filled 2024-08-17: qty 6, 84d supply, fill #0

## 2024-06-05 NOTE — Patient Instructions (Addendum)
 Please go downstairs for labs.   Check on the shingles vaccine. Shingrix. If you want to get it, schedule a nurse visit.   I am increasing your Mounjaro  to 10 mg weekly. New prescription was sent to your pharmacy.

## 2024-06-05 NOTE — Progress Notes (Signed)
 Complete physical exam  Patient: Jim Hunter   DOB: 1965/12/29   58 y.o. Male  MRN: [id number]  Subjective:    Chief Complaint  Patient presents with   Annual Exam   He is here for a complete physical exam and to follow up on chronic health conditions.    Discussed the use of AI scribe software for clinical note transcription with the patient, who gave verbal consent to proceed.  History of Present Illness Jim Hunter is a 58 year old male who presents for an annual physical exam and preventive healthcare visit.  Glycemic control - Diabetes is well-controlled with a recent hemoglobin A1c of 5.8%. - Current regimen includes weekly injection of 7.5 mg and metformin . - No major side effects from diabetes medications. - Interested in increasing Mounjaro  dose for better effect on blood sugar and obesity  Weight management - Pleased with recent weight loss but Mounjaro  at current dose is not as effective and interested in dose increase  Hyperlipidemia management - Hyperlipidemia managed with Zetia  and dietary modifications due to statin intolerance. - Not at goal - Open to considering a non-statin injectable therapy if indicated.  Cardiovascular and respiratory symptoms - No current chest pain, dizziness, or peripheral edema. - No current shortness of breath; exercise tolerance has improved.  Sleep-related symptoms - History of borderline sleep apnea without further diagnostic evaluation. - does not use CPAP and is not interested in using it. States he snores less now that he has lost weight  - Experiences nocturnal dry mouth, likely due to mouth breathing.     Health Maintenance  Topic Date Due   Hepatitis B Vaccine (1 of 3 - 19+ 3-dose series) Never done   Zoster (Shingles) Vaccine (1 of 2) Never done   DTaP/Tdap/Td vaccine (2 - Td or Tdap) 10/03/2022   Hemoglobin A1C  04/26/2024   COVID-19 Vaccine (6 - 2025-26 season) 06/21/2024*   Eye exam for diabetics  07/31/2024    Yearly kidney function blood test for diabetes  10/25/2024   Yearly kidney health urinalysis for diabetes  10/25/2024   Complete foot exam   10/25/2024   Colon Cancer Screening  01/24/2026   Pneumococcal Vaccine for age over 61  Completed   Flu Shot  Completed   Hepatitis C Screening  Completed   HIV Screening  Completed   HPV Vaccine  Aged Out   Meningitis B Vaccine  Aged Out  *Topic was postponed. The date shown is not the original due date.     Depression screening:    06/05/2024    9:10 AM 06/04/2023    8:44 AM 03/08/2023    8:43 AM  Depression screen PHQ 2/9  Decreased Interest 0 0 0  Down, Depressed, Hopeless 0 0 0  PHQ - 2 Score 0 0 0   Anxiety Screening:     No data to display           Patient Care Team: Lendia Boby CROME, NP-C as PCP - General (Family Medicine)   Outpatient Medications Prior to Visit  Medication Sig   amLODipine  (NORVASC ) 5 MG tablet Take 1 tablet (5 mg total) by mouth daily.   esomeprazole  (NEXIUM ) 40 MG capsule Take 1 capsule (40 mg total) by mouth daily.   ezetimibe  (ZETIA ) 10 MG tablet Take 1 tablet (10 mg total) by mouth daily.   fluticasone  (FLONASE ) 50 MCG/ACT nasal spray Place 2 sprays into both nostrils daily.   levocetirizine (XYZAL ) 5 MG tablet Take 1  tablet (5 mg total) by mouth every evening.   metFORMIN  (GLUCOPHAGE -XR) 500 MG 24 hr tablet Take 1 tablet (500 mg total) by mouth daily with breakfast.   omega-3 acid ethyl esters (LOVAZA ) 1 g capsule Take 2 capsules (2 g total) by mouth 2 (two) times daily.   sildenafil  (VIAGRA ) 100 MG tablet Take 1 tablet (100 mg total) by mouth daily as needed.   [DISCONTINUED] tirzepatide  (MOUNJARO ) 7.5 MG/0.5ML Pen Inject 7.5 mg into the skin once a week. Please schedule office visit for refills   No facility-administered medications prior to visit.    Review of Systems  Constitutional:  Positive for weight loss. Negative for chills, fever and malaise/fatigue.       Intentional   HENT:   Negative for congestion, ear pain, sinus pain and sore throat.   Eyes:  Negative for blurred vision, double vision and pain.  Respiratory:  Negative for cough, shortness of breath and wheezing.   Cardiovascular:  Negative for chest pain, palpitations and leg swelling.  Gastrointestinal:  Negative for abdominal pain, constipation, diarrhea, nausea and vomiting.  Genitourinary:  Negative for dysuria, frequency and urgency.  Musculoskeletal:  Negative for back pain, joint pain and myalgias.  Skin:  Negative for rash.  Neurological:  Negative for dizziness, tingling, focal weakness and headaches.  Psychiatric/Behavioral:  Negative for depression. The patient is not nervous/anxious.        Objective:    BP 116/82 (BP Location: Right Arm, Patient Position: Sitting, Cuff Size: Normal)   Pulse 82   Temp 97.8 F (36.6 C) (Oral)   Ht 5' 9 (1.753 m)   Wt 236 lb (107 kg)   SpO2 96%   BMI 34.85 kg/m  BP Readings from Last 3 Encounters:  06/05/24 116/82  05/14/24 131/85  10/26/23 122/84   Wt Readings from Last 3 Encounters:  06/05/24 236 lb (107 kg)  10/26/23 247 lb (112 kg)  06/22/23 270 lb (122.5 kg)    Physical Exam Constitutional:      General: He is not in acute distress.    Appearance: He is not ill-appearing.  HENT:     Right Ear: Tympanic membrane, ear canal and external ear normal.     Left Ear: Tympanic membrane, ear canal and external ear normal.     Nose: Nose normal.     Mouth/Throat:     Mouth: Mucous membranes are moist.     Pharynx: Oropharynx is clear.  Eyes:     Extraocular Movements: Extraocular movements intact.     Conjunctiva/sclera: Conjunctivae normal.     Pupils: Pupils are equal, round, and reactive to light.  Neck:     Thyroid : No thyroid  mass, thyromegaly or thyroid  tenderness.  Cardiovascular:     Rate and Rhythm: Normal rate and regular rhythm.     Pulses: Normal pulses.     Heart sounds: Normal heart sounds.  Pulmonary:     Effort: Pulmonary  effort is normal.     Breath sounds: Normal breath sounds.  Abdominal:     General: Bowel sounds are normal. There is no distension.     Palpations: Abdomen is soft.     Tenderness: There is no abdominal tenderness. There is no right CVA tenderness, left CVA tenderness, guarding or rebound.  Musculoskeletal:        General: Normal range of motion.     Cervical back: Normal range of motion and neck supple. No tenderness.     Right lower leg: No edema.  Left lower leg: No edema.  Lymphadenopathy:     Cervical: No cervical adenopathy.  Skin:    General: Skin is warm and dry.     Findings: No lesion or rash.  Neurological:     General: No focal deficit present.     Mental Status: He is alert and oriented to person, place, and time.     Cranial Nerves: No cranial nerve deficit.     Sensory: No sensory deficit.     Motor: No weakness.  Psychiatric:        Mood and Affect: Mood normal.        Behavior: Behavior normal.        Thought Content: Thought content normal.      No results found for any visits on 06/05/24.    Assessment & Plan:    Routine Health Maintenance and Physical Exam Problem List Items Addressed This Visit     Hyperlipidemia   Relevant Orders   Lipid panel   Hypertension   Relevant Orders   CBC with Differential/Platelet   Comprehensive metabolic panel with GFR   TSH   EKG 12-Lead   Mild left ventricular hypertrophy   Relevant Orders   EKG 12-Lead   Obesity (BMI 30-39.9)   Relevant Medications   tirzepatide  (MOUNJARO ) 10 MG/0.5ML Pen   Other Relevant Orders   CBC with Differential/Platelet   Comprehensive metabolic panel with GFR   Hemoglobin A1c   Lipid panel   TSH   OSA (obstructive sleep apnea)   Other Visit Diagnoses       Encounter for general adult medical examination with abnormal findings    -  Primary   Relevant Orders   EKG 12-Lead     Type 2 diabetes mellitus in patient with obesity (HCC)       Relevant Medications    tirzepatide  (MOUNJARO ) 10 MG/0.5ML Pen   Other Relevant Orders   CBC with Differential/Platelet   Comprehensive metabolic panel with GFR   Hemoglobin A1c   TSH     Screening for prostate cancer       Relevant Orders   PSA     Mild anemia       Relevant Orders   CBC with Differential/Platelet   Comprehensive metabolic panel with GFR   Vitamin B12   Folate   Ferritin     Vitamin B 12 deficiency       Relevant Orders   Vitamin B12       Assessment and Plan Assessment & Plan Adult Wellness Visit Annual physical exam and preventive healthcare visit conducted. Immunizations reviewed. Up to date on flu and pneumonia vaccines. Tetanus status to be confirmed. Shingles vaccine discussed and pending affordability check. - Checked tetanus vaccination status - Schedule nurse visit for shingles vaccine if affordable  Type 2 diabetes mellitus Diabetes well controlled with recent A1c of 5.8, improved from 6.6 in August 2024. No major side effects from current medication regimen. - Increased Tirzepatide  (Mounjaro ) to 10 mg subcutaneous weekly - Continue Metformin  (Glucophage -XR) 500 mg oral daily with breakfast  Essential hypertension Blood pressure well controlled with current medication regimen. - Continue Amlodipine  (Norvasc ) 5 mg oral daily  Mixed hyperlipidemia, statin intolerant Cholesterol management challenging due to statin intolerance causing hives. Current LDL at 119 mg/dL, above goal of less than 70 mg/dL due to diabetes diagnosis. Discussed Repatha  as an alternative to statins, which should not cause side effects. - Will attempt to get approval for Repatha  injection if his  LDL is not at goal - check fasting lipids  Obesity (BMI 30-39.9) BMI now 34.8. Weight loss contributing to improved health outcomes. - Continue healthy diet and exercise regimen  Anemia, mild Mild anemia noted. Previous B12 deficiency. - Ordered CBC, iron, B12, and ferritin tests - Will consider stool  studies if anemia persists - Colonoscopy not due again until 2027  History of vitamin B12 deficiency Previous B12 deficiency noted. - Checked B12 levels  Left ventricular hypertrophy, mild Mild left ventricular hypertrophy noted on echocardiogram. No current symptoms of shortness of breath or other cardiac issues. - Ordered EKG to assess heart rhythm  -EKG shows NSR, rate 76, incomplete RBBB   Obstructive sleep apnea, possible Possible obstructive sleep apnea with previous borderline sleep study results. No current CPAP use. Symptoms improved with weight loss. - Continue to monitor symptoms and consider further evaluation if symptoms worsen     Return in about 4 months (around 10/03/2024) for chronic health conditions.     Boby Mackintosh, NP-C

## 2024-06-06 ENCOUNTER — Telehealth (HOSPITAL_COMMUNITY): Payer: Self-pay

## 2024-06-06 ENCOUNTER — Encounter (HOSPITAL_COMMUNITY): Payer: Self-pay

## 2024-06-06 ENCOUNTER — Other Ambulatory Visit (HOSPITAL_COMMUNITY): Payer: Self-pay

## 2024-06-06 ENCOUNTER — Other Ambulatory Visit: Payer: Self-pay | Admitting: Family Medicine

## 2024-06-06 ENCOUNTER — Ambulatory Visit: Payer: Self-pay | Admitting: Family Medicine

## 2024-06-06 DIAGNOSIS — E669 Obesity, unspecified: Secondary | ICD-10-CM

## 2024-06-06 DIAGNOSIS — D509 Iron deficiency anemia, unspecified: Secondary | ICD-10-CM

## 2024-06-06 DIAGNOSIS — E782 Mixed hyperlipidemia: Secondary | ICD-10-CM

## 2024-06-06 DIAGNOSIS — I517 Cardiomegaly: Secondary | ICD-10-CM

## 2024-06-06 DIAGNOSIS — Z789 Other specified health status: Secondary | ICD-10-CM | POA: Insufficient documentation

## 2024-06-06 MED ORDER — REPATHA SURECLICK 140 MG/ML ~~LOC~~ SOAJ
140.0000 mg | SUBCUTANEOUS | 2 refills | Status: DC
  Filled 2024-06-06 – 2024-06-10 (×2): qty 2, 28d supply, fill #0
  Filled 2024-07-11: qty 2, 28d supply, fill #1
  Filled 2024-08-07: qty 2, 28d supply, fill #2

## 2024-06-06 NOTE — Telephone Encounter (Signed)
 Pharmacy Patient Advocate Encounter   Received notification from Pt Calls Messages that prior authorization for Repatha  SureClick 140mg /ml is required/requested.   Insurance verification completed.   The patient is insured through [facility].   Per test claim: PA required; PA submitted to above mentioned insurance via Latent Key/confirmation #/EOC [facility] Status is pending

## 2024-06-06 NOTE — Progress Notes (Signed)
 Ordering Repatha  for him since he cannot take statins.

## 2024-06-10 ENCOUNTER — Other Ambulatory Visit (HOSPITAL_COMMUNITY): Payer: Self-pay

## 2024-06-10 NOTE — Telephone Encounter (Signed)
 Pharmacy Patient Advocate Encounter  Received notification from [facility] that Prior Authorization for Repatha  SureClick 140mg /ml has been APPROVED from 06/10/24 to 06/09/25   PA #/Case ID/Reference #: [id number]

## 2024-06-25 ENCOUNTER — Other Ambulatory Visit (HOSPITAL_COMMUNITY): Payer: Self-pay

## 2024-07-11 ENCOUNTER — Other Ambulatory Visit (HOSPITAL_COMMUNITY): Payer: Self-pay

## 2024-07-21 ENCOUNTER — Other Ambulatory Visit (HOSPITAL_COMMUNITY): Payer: Self-pay

## 2024-08-07 ENCOUNTER — Other Ambulatory Visit (HOSPITAL_COMMUNITY): Payer: Self-pay

## 2024-08-08 ENCOUNTER — Other Ambulatory Visit (HOSPITAL_COMMUNITY): Payer: Self-pay

## 2024-08-17 ENCOUNTER — Other Ambulatory Visit (HOSPITAL_COMMUNITY): Payer: Self-pay

## 2024-08-17 ENCOUNTER — Other Ambulatory Visit: Payer: Self-pay

## 2024-08-17 ENCOUNTER — Other Ambulatory Visit: Payer: Self-pay | Admitting: Family Medicine

## 2024-08-17 DIAGNOSIS — E782 Mixed hyperlipidemia: Secondary | ICD-10-CM

## 2024-08-17 DIAGNOSIS — I517 Cardiomegaly: Secondary | ICD-10-CM

## 2024-08-17 DIAGNOSIS — Z789 Other specified health status: Secondary | ICD-10-CM

## 2024-08-17 DIAGNOSIS — E669 Obesity, unspecified: Secondary | ICD-10-CM

## 2024-08-18 ENCOUNTER — Other Ambulatory Visit (HOSPITAL_COMMUNITY): Payer: Self-pay

## 2024-08-18 ENCOUNTER — Other Ambulatory Visit: Payer: Self-pay

## 2024-08-18 MED ORDER — REPATHA SURECLICK 140 MG/ML ~~LOC~~ SOAJ
140.0000 mg | SUBCUTANEOUS | 2 refills | Status: AC
  Filled 2024-08-18: qty 2, 28d supply, fill #0

## 2024-08-21 ENCOUNTER — Other Ambulatory Visit: Payer: Self-pay

## 2024-10-09 ENCOUNTER — Ambulatory Visit: Admitting: Family Medicine
